# Patient Record
Sex: Female | Born: 1965 | Race: Black or African American | Hispanic: No | Smoking: Current every day smoker
Health system: Southern US, Community
[De-identification: ages and names within clinical notes are randomized; demographics above are authoritative.]

## PROBLEM LIST (undated history)

## (undated) DIAGNOSIS — N1330 Unspecified hydronephrosis: Secondary | ICD-10-CM

## (undated) DIAGNOSIS — I351 Nonrheumatic aortic (valve) insufficiency: Secondary | ICD-10-CM

## (undated) DIAGNOSIS — Z87442 Personal history of urinary calculi: Secondary | ICD-10-CM

## (undated) DIAGNOSIS — I428 Other cardiomyopathies: Secondary | ICD-10-CM

## (undated) DIAGNOSIS — I447 Left bundle-branch block, unspecified: Secondary | ICD-10-CM

## (undated) DIAGNOSIS — I5022 Chronic systolic (congestive) heart failure: Secondary | ICD-10-CM

## (undated) DIAGNOSIS — E785 Hyperlipidemia, unspecified: Secondary | ICD-10-CM

## (undated) DIAGNOSIS — I340 Nonrheumatic mitral (valve) insufficiency: Secondary | ICD-10-CM

## (undated) DIAGNOSIS — N183 Chronic kidney disease, stage 3 unspecified: Secondary | ICD-10-CM

## (undated) DIAGNOSIS — Z720 Tobacco use: Secondary | ICD-10-CM

## (undated) DIAGNOSIS — D649 Anemia, unspecified: Secondary | ICD-10-CM

## (undated) DIAGNOSIS — J449 Chronic obstructive pulmonary disease, unspecified: Secondary | ICD-10-CM

## (undated) DIAGNOSIS — D696 Thrombocytopenia, unspecified: Secondary | ICD-10-CM

## (undated) DIAGNOSIS — I219 Acute myocardial infarction, unspecified: Secondary | ICD-10-CM

## (undated) DIAGNOSIS — J154 Pneumonia due to other streptococci: Secondary | ICD-10-CM

## (undated) DIAGNOSIS — N200 Calculus of kidney: Secondary | ICD-10-CM

## (undated) DIAGNOSIS — D069 Carcinoma in situ of cervix, unspecified: Secondary | ICD-10-CM

## (undated) DIAGNOSIS — B20 Human immunodeficiency virus [HIV] disease: Secondary | ICD-10-CM

## (undated) DIAGNOSIS — I10 Essential (primary) hypertension: Secondary | ICD-10-CM

## (undated) DIAGNOSIS — J309 Allergic rhinitis, unspecified: Secondary | ICD-10-CM

## (undated) DIAGNOSIS — Z21 Asymptomatic human immunodeficiency virus [HIV] infection status: Secondary | ICD-10-CM

## (undated) HISTORY — PX: PACEMAKER IMPLANT: EP1218

## (undated) HISTORY — PX: CHOLECYSTECTOMY: SHX55

---

## 1999-04-16 ENCOUNTER — Emergency Department (HOSPITAL_COMMUNITY): Admission: EM | Admit: 1999-04-16 | Discharge: 1999-04-16 | Payer: Self-pay | Admitting: Emergency Medicine

## 2001-07-26 DIAGNOSIS — Z21 Asymptomatic human immunodeficiency virus [HIV] infection status: Secondary | ICD-10-CM

## 2001-07-26 HISTORY — DX: Asymptomatic human immunodeficiency virus (hiv) infection status: Z21

## 2003-12-26 ENCOUNTER — Inpatient Hospital Stay (HOSPITAL_COMMUNITY): Admission: EM | Admit: 2003-12-26 | Discharge: 2004-01-15 | Payer: Self-pay | Admitting: Emergency Medicine

## 2004-01-03 ENCOUNTER — Encounter: Payer: Self-pay | Admitting: Cardiology

## 2004-01-07 ENCOUNTER — Encounter (INDEPENDENT_AMBULATORY_CARE_PROVIDER_SITE_OTHER): Payer: Self-pay | Admitting: *Deleted

## 2004-01-10 ENCOUNTER — Encounter: Payer: Self-pay | Admitting: Thoracic Surgery

## 2004-01-29 ENCOUNTER — Encounter: Admission: RE | Admit: 2004-01-29 | Discharge: 2004-01-29 | Payer: Self-pay | Admitting: Thoracic Surgery

## 2004-02-04 ENCOUNTER — Encounter: Admission: RE | Admit: 2004-02-04 | Discharge: 2004-02-04 | Payer: Self-pay | Admitting: Internal Medicine

## 2004-02-12 ENCOUNTER — Emergency Department (HOSPITAL_COMMUNITY): Admission: EM | Admit: 2004-02-12 | Discharge: 2004-02-12 | Payer: Self-pay | Admitting: Emergency Medicine

## 2004-02-18 ENCOUNTER — Encounter: Admission: RE | Admit: 2004-02-18 | Discharge: 2004-02-18 | Payer: Self-pay | Admitting: Internal Medicine

## 2004-02-18 ENCOUNTER — Ambulatory Visit (HOSPITAL_COMMUNITY): Admission: RE | Admit: 2004-02-18 | Discharge: 2004-02-18 | Payer: Self-pay | Admitting: Internal Medicine

## 2004-03-18 ENCOUNTER — Ambulatory Visit (HOSPITAL_COMMUNITY): Admission: RE | Admit: 2004-03-18 | Discharge: 2004-03-18 | Payer: Self-pay | Admitting: Infectious Diseases

## 2004-03-18 ENCOUNTER — Encounter: Admission: RE | Admit: 2004-03-18 | Discharge: 2004-03-18 | Payer: Self-pay | Admitting: Infectious Diseases

## 2004-03-19 ENCOUNTER — Ambulatory Visit (HOSPITAL_COMMUNITY): Admission: RE | Admit: 2004-03-19 | Discharge: 2004-03-19 | Payer: Self-pay | Admitting: Internal Medicine

## 2004-08-12 ENCOUNTER — Encounter (INDEPENDENT_AMBULATORY_CARE_PROVIDER_SITE_OTHER): Payer: Self-pay | Admitting: *Deleted

## 2004-08-12 ENCOUNTER — Encounter: Payer: Self-pay | Admitting: Infectious Diseases

## 2004-08-12 ENCOUNTER — Ambulatory Visit (HOSPITAL_COMMUNITY): Admission: RE | Admit: 2004-08-12 | Discharge: 2004-08-12 | Payer: Self-pay | Admitting: Internal Medicine

## 2004-08-12 ENCOUNTER — Ambulatory Visit: Payer: Self-pay | Admitting: Internal Medicine

## 2004-08-12 ENCOUNTER — Encounter: Admission: RE | Admit: 2004-08-12 | Discharge: 2004-08-12 | Payer: Self-pay | Admitting: Thoracic Surgery

## 2004-08-12 ENCOUNTER — Other Ambulatory Visit: Admission: RE | Admit: 2004-08-12 | Discharge: 2004-08-12 | Payer: Self-pay | Admitting: Internal Medicine

## 2004-08-18 ENCOUNTER — Ambulatory Visit (HOSPITAL_COMMUNITY): Admission: RE | Admit: 2004-08-18 | Discharge: 2004-08-18 | Payer: Self-pay | Admitting: Internal Medicine

## 2004-10-18 ENCOUNTER — Emergency Department (HOSPITAL_COMMUNITY): Admission: EM | Admit: 2004-10-18 | Discharge: 2004-10-18 | Payer: Self-pay | Admitting: Emergency Medicine

## 2004-11-22 ENCOUNTER — Emergency Department (HOSPITAL_COMMUNITY): Admission: EM | Admit: 2004-11-22 | Discharge: 2004-11-22 | Payer: Self-pay | Admitting: Emergency Medicine

## 2004-12-22 ENCOUNTER — Ambulatory Visit: Payer: Self-pay | Admitting: Internal Medicine

## 2004-12-22 ENCOUNTER — Ambulatory Visit (HOSPITAL_COMMUNITY): Admission: RE | Admit: 2004-12-22 | Discharge: 2004-12-22 | Payer: Self-pay | Admitting: Internal Medicine

## 2005-02-10 ENCOUNTER — Ambulatory Visit: Payer: Self-pay | Admitting: Infectious Diseases

## 2005-02-24 ENCOUNTER — Ambulatory Visit: Payer: Self-pay | Admitting: Infectious Diseases

## 2005-05-28 ENCOUNTER — Ambulatory Visit (HOSPITAL_COMMUNITY): Admission: RE | Admit: 2005-05-28 | Discharge: 2005-05-28 | Payer: Self-pay | Admitting: Infectious Diseases

## 2005-05-28 ENCOUNTER — Ambulatory Visit: Payer: Self-pay | Admitting: Infectious Diseases

## 2005-05-30 ENCOUNTER — Emergency Department (HOSPITAL_COMMUNITY): Admission: EM | Admit: 2005-05-30 | Discharge: 2005-05-30 | Payer: Self-pay | Admitting: Emergency Medicine

## 2005-06-12 ENCOUNTER — Emergency Department (HOSPITAL_COMMUNITY): Admission: EM | Admit: 2005-06-12 | Discharge: 2005-06-12 | Payer: Self-pay | Admitting: Emergency Medicine

## 2005-06-25 ENCOUNTER — Ambulatory Visit: Payer: Self-pay | Admitting: Internal Medicine

## 2005-12-09 ENCOUNTER — Emergency Department (HOSPITAL_COMMUNITY): Admission: EM | Admit: 2005-12-09 | Discharge: 2005-12-10 | Payer: Self-pay | Admitting: Emergency Medicine

## 2005-12-11 IMAGING — CR DG CHEST 2V
2 series · 2 of 2 positions shown · non-contrast
Comparison: none

CLINICAL DATA: Pneumonia.
 TWO-VIEW CHEST, 01/12/04, [DATE] HOURS
 Comparison 01/11/04 [DATE] hours.

[view not recorded (1 of 2)]
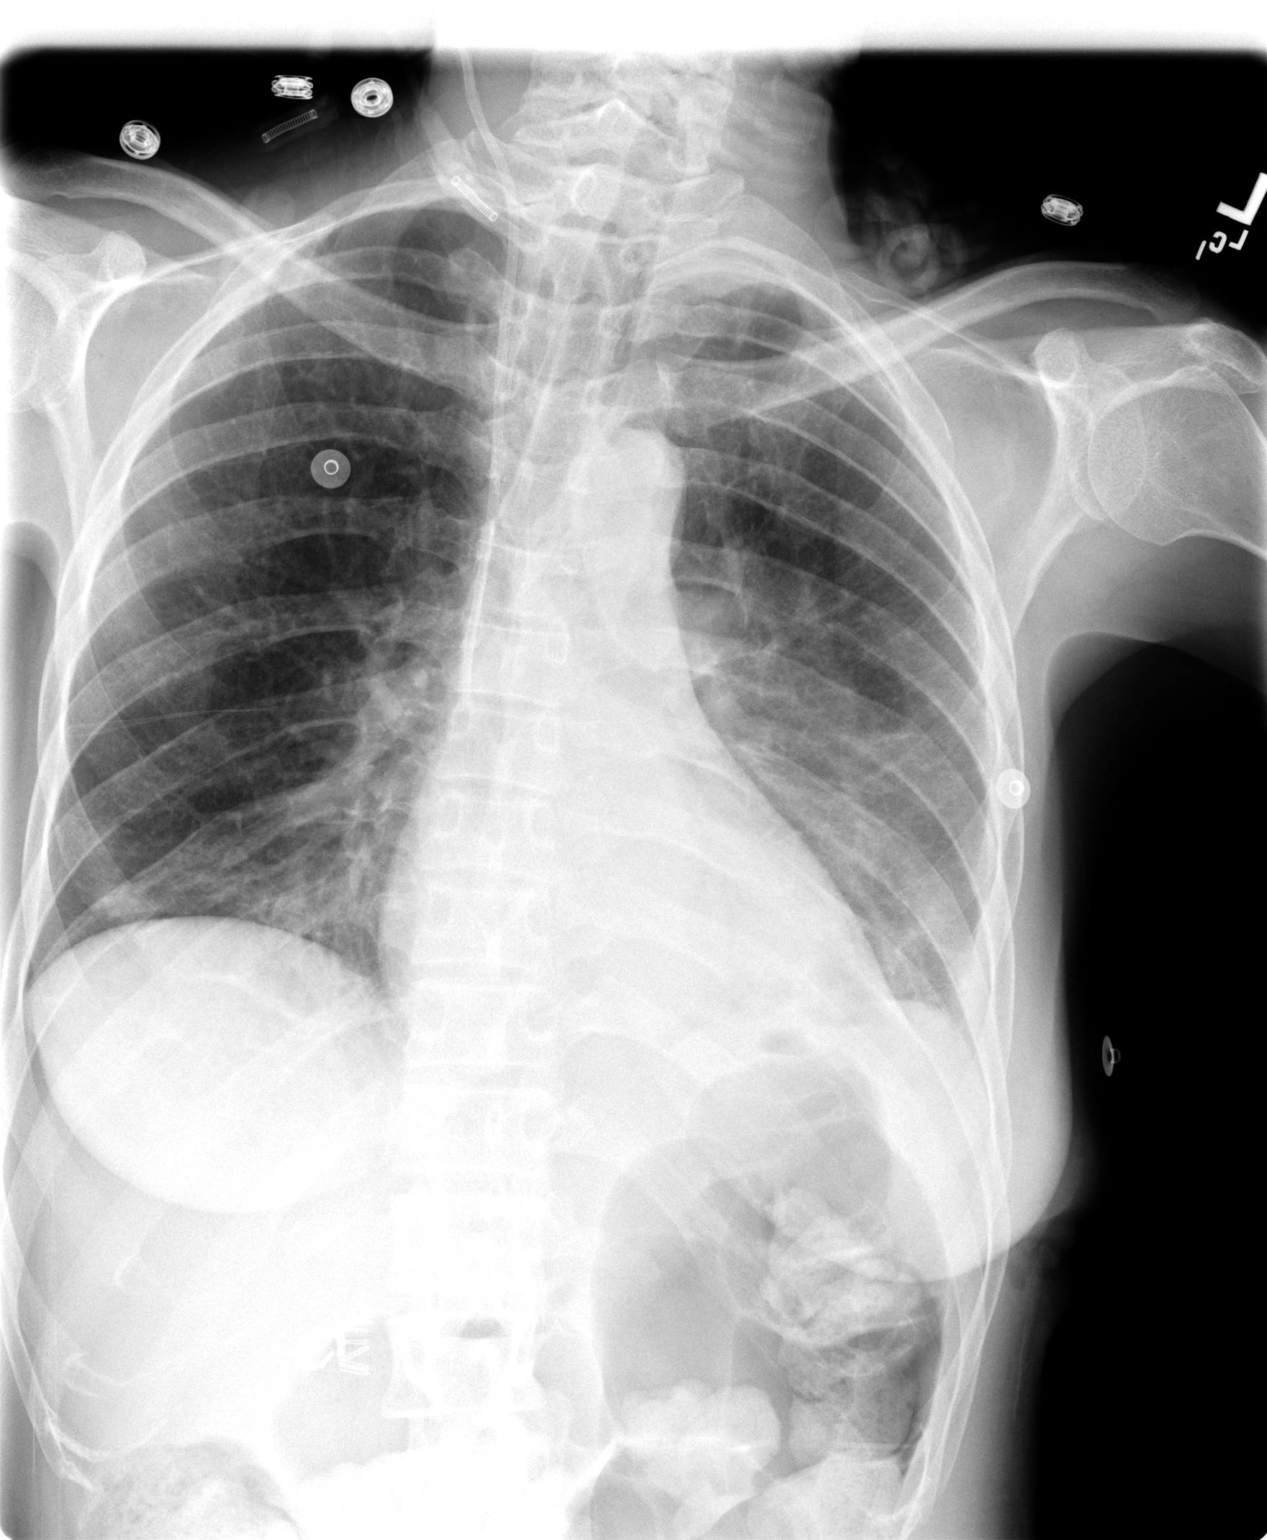

[view not recorded (2 of 2)]
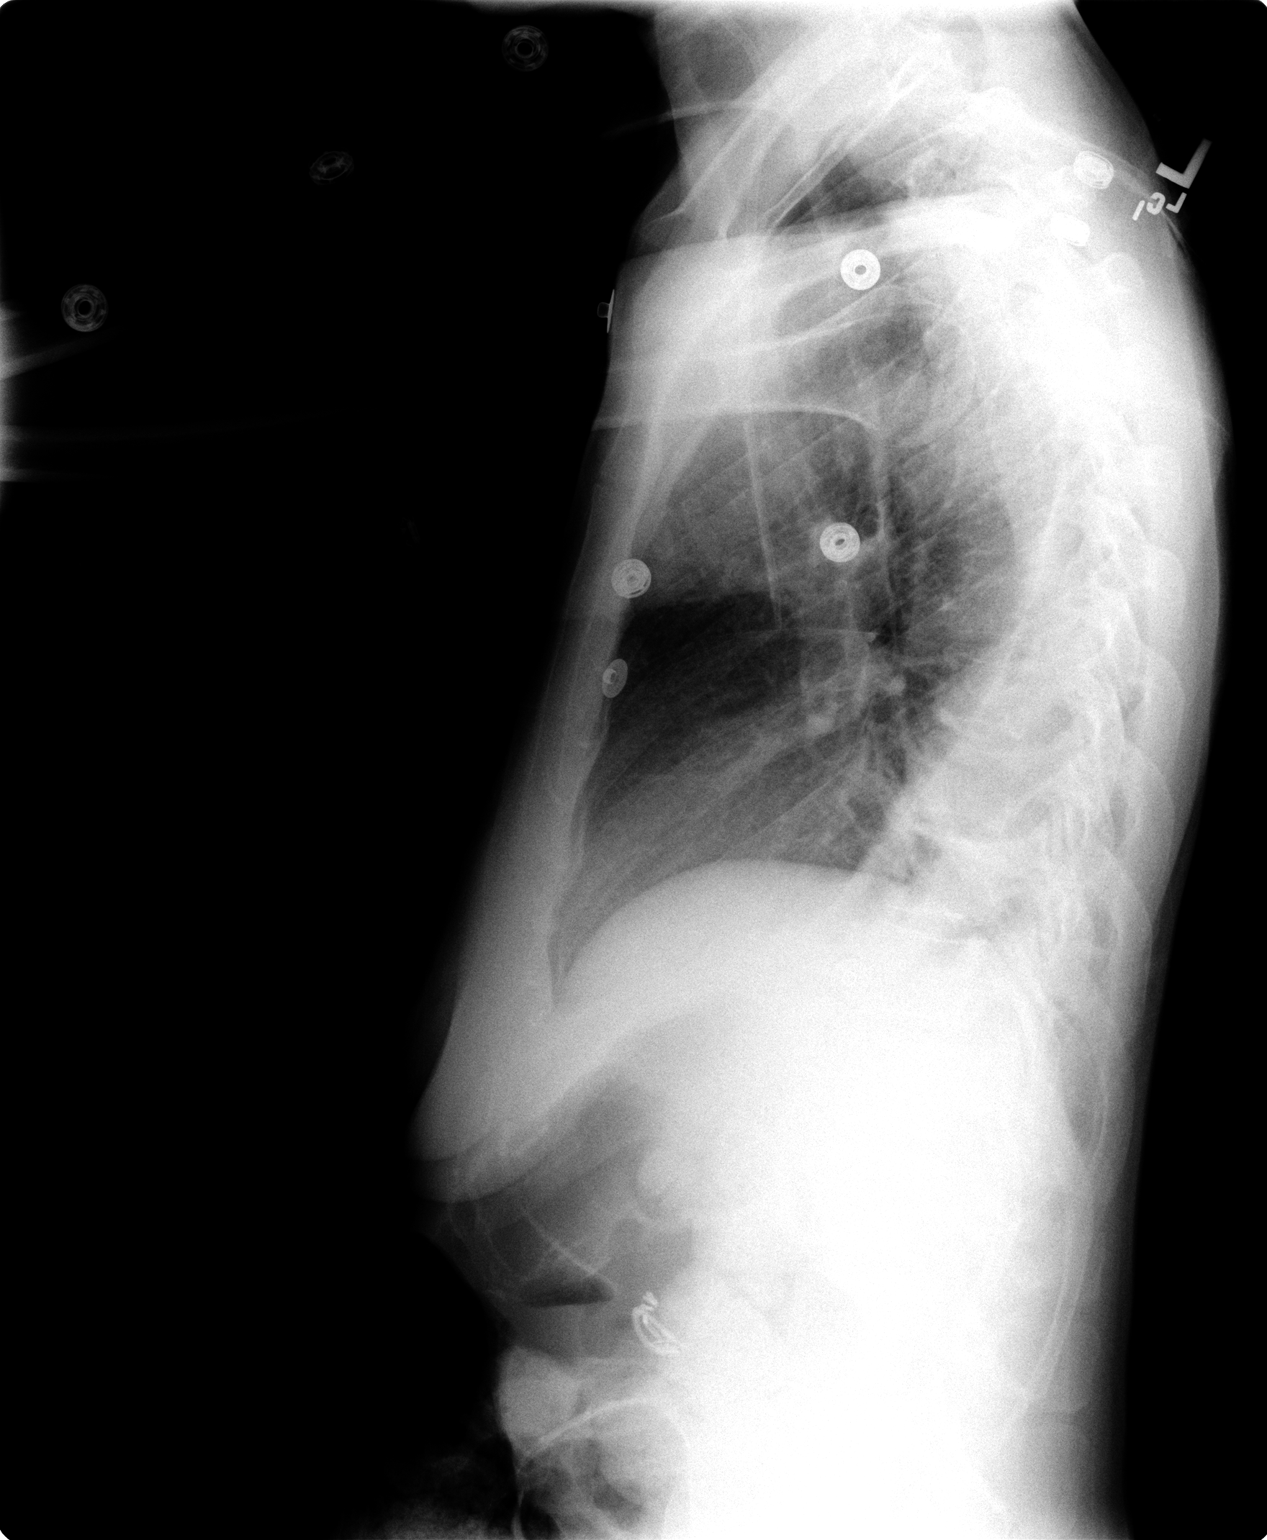

[2 of 2 positions shown; findings below may reference images not displayed]

FINDINGS: The right IJ vein central venous catheter is stable.  The heart is mildly enlarged.   A   left pleural effusion is not significantly changed.  Left lower lobe atelectasis is present and likely not significantly changed.  Right basilar atelectasis is mild.  No pneumothoraces are seen.  No interstitial edema is seen.
 IMPRESSION 
 Overall, there has been no significant change.  Small left effusion with left lower lobe atelectasis is present.  Mild right basilar atelectasis is present.

## 2005-12-15 ENCOUNTER — Inpatient Hospital Stay (HOSPITAL_COMMUNITY): Admission: AD | Admit: 2005-12-15 | Discharge: 2005-12-28 | Payer: Self-pay | Admitting: Internal Medicine

## 2005-12-15 ENCOUNTER — Ambulatory Visit: Payer: Self-pay | Admitting: Internal Medicine

## 2005-12-15 ENCOUNTER — Encounter: Payer: Self-pay | Admitting: Emergency Medicine

## 2005-12-16 ENCOUNTER — Encounter (INDEPENDENT_AMBULATORY_CARE_PROVIDER_SITE_OTHER): Payer: Self-pay | Admitting: *Deleted

## 2006-01-06 ENCOUNTER — Ambulatory Visit: Payer: Self-pay | Admitting: Internal Medicine

## 2006-02-23 ENCOUNTER — Ambulatory Visit: Payer: Self-pay | Admitting: Infectious Diseases

## 2006-02-23 ENCOUNTER — Encounter: Admission: RE | Admit: 2006-02-23 | Discharge: 2006-02-23 | Payer: Self-pay | Admitting: Infectious Diseases

## 2006-02-23 ENCOUNTER — Encounter (INDEPENDENT_AMBULATORY_CARE_PROVIDER_SITE_OTHER): Payer: Self-pay | Admitting: *Deleted

## 2006-02-23 LAB — CONVERTED CEMR LAB: CD4 Count: 9 microliters

## 2006-03-14 ENCOUNTER — Ambulatory Visit: Payer: Self-pay | Admitting: Infectious Diseases

## 2006-04-14 ENCOUNTER — Emergency Department (HOSPITAL_COMMUNITY): Admission: EM | Admit: 2006-04-14 | Discharge: 2006-04-14 | Payer: Self-pay | Admitting: Emergency Medicine

## 2006-05-20 ENCOUNTER — Ambulatory Visit: Payer: Self-pay | Admitting: Internal Medicine

## 2006-05-20 ENCOUNTER — Encounter: Payer: Self-pay | Admitting: Infectious Diseases

## 2006-05-20 ENCOUNTER — Encounter: Admission: RE | Admit: 2006-05-20 | Discharge: 2006-05-20 | Payer: Self-pay | Admitting: Internal Medicine

## 2006-05-20 ENCOUNTER — Encounter (INDEPENDENT_AMBULATORY_CARE_PROVIDER_SITE_OTHER): Payer: Self-pay | Admitting: *Deleted

## 2006-05-26 DIAGNOSIS — IMO0002 Reserved for concepts with insufficient information to code with codable children: Secondary | ICD-10-CM | POA: Insufficient documentation

## 2006-05-26 DIAGNOSIS — N1330 Unspecified hydronephrosis: Secondary | ICD-10-CM | POA: Insufficient documentation

## 2006-05-26 DIAGNOSIS — B59 Pneumocystosis: Secondary | ICD-10-CM | POA: Insufficient documentation

## 2006-05-26 DIAGNOSIS — Z9089 Acquired absence of other organs: Secondary | ICD-10-CM

## 2006-05-26 DIAGNOSIS — D709 Neutropenia, unspecified: Secondary | ICD-10-CM

## 2006-05-26 DIAGNOSIS — B20 Human immunodeficiency virus [HIV] disease: Secondary | ICD-10-CM | POA: Insufficient documentation

## 2006-05-26 DIAGNOSIS — D649 Anemia, unspecified: Secondary | ICD-10-CM

## 2006-05-26 DIAGNOSIS — Z21 Asymptomatic human immunodeficiency virus [HIV] infection status: Secondary | ICD-10-CM | POA: Insufficient documentation

## 2006-05-26 DIAGNOSIS — Z87410 Personal history of cervical dysplasia: Secondary | ICD-10-CM

## 2006-05-26 DIAGNOSIS — J154 Pneumonia due to other streptococci: Secondary | ICD-10-CM | POA: Insufficient documentation

## 2006-05-26 DIAGNOSIS — I10 Essential (primary) hypertension: Secondary | ICD-10-CM | POA: Insufficient documentation

## 2006-05-26 DIAGNOSIS — N135 Crossing vessel and stricture of ureter without hydronephrosis: Secondary | ICD-10-CM

## 2006-07-12 ENCOUNTER — Ambulatory Visit: Payer: Self-pay | Admitting: Internal Medicine

## 2006-07-12 ENCOUNTER — Ambulatory Visit (HOSPITAL_COMMUNITY): Admission: RE | Admit: 2006-07-12 | Discharge: 2006-07-12 | Payer: Self-pay | Admitting: Internal Medicine

## 2006-07-12 ENCOUNTER — Encounter: Payer: Self-pay | Admitting: Infectious Diseases

## 2006-08-15 ENCOUNTER — Emergency Department (HOSPITAL_COMMUNITY): Admission: EM | Admit: 2006-08-15 | Discharge: 2006-08-16 | Payer: Self-pay | Admitting: Emergency Medicine

## 2006-08-19 ENCOUNTER — Ambulatory Visit: Payer: Self-pay | Admitting: Internal Medicine

## 2006-08-19 ENCOUNTER — Encounter: Payer: Self-pay | Admitting: Infectious Diseases

## 2006-09-05 ENCOUNTER — Ambulatory Visit: Payer: Self-pay | Admitting: Infectious Diseases

## 2006-09-05 DIAGNOSIS — R259 Unspecified abnormal involuntary movements: Secondary | ICD-10-CM | POA: Insufficient documentation

## 2006-09-09 ENCOUNTER — Encounter (INDEPENDENT_AMBULATORY_CARE_PROVIDER_SITE_OTHER): Payer: Self-pay | Admitting: *Deleted

## 2006-09-09 ENCOUNTER — Ambulatory Visit: Payer: Self-pay | Admitting: Internal Medicine

## 2006-09-09 ENCOUNTER — Encounter: Admission: RE | Admit: 2006-09-09 | Discharge: 2006-09-09 | Payer: Self-pay | Admitting: Internal Medicine

## 2006-09-09 ENCOUNTER — Ambulatory Visit: Payer: Self-pay | Admitting: Infectious Diseases

## 2006-09-09 ENCOUNTER — Inpatient Hospital Stay (HOSPITAL_COMMUNITY): Admission: AD | Admit: 2006-09-09 | Discharge: 2006-09-14 | Payer: Self-pay | Admitting: Internal Medicine

## 2006-09-09 LAB — CONVERTED CEMR LAB: HIV 1 RNA Quant: 250000 copies/mL

## 2006-09-11 ENCOUNTER — Ambulatory Visit: Payer: Self-pay | Admitting: Infectious Diseases

## 2006-09-19 ENCOUNTER — Encounter (INDEPENDENT_AMBULATORY_CARE_PROVIDER_SITE_OTHER): Payer: Self-pay | Admitting: *Deleted

## 2006-11-21 IMAGING — CT CT HEAD WO/W CM
1 of 2 series · 13 of 30 positions shown, 17 images · IV contrast (omnipaque)
Comparison: none

CLINICAL DATA: Headache.
 HEAD CT WITH AND WITHOUT CONTRAST:
TECHNIQUE: Contiguous axial CT images were obtained from the skull base to the vertex before and after the administration of 100 cc of Omnipaque 300 contrast material.

[Series 2: brain · axial · 0.47mm/px · z∈[+142,+263]mm · 13 of 28 slices shown, 17 images]
[im 2/28  brain]
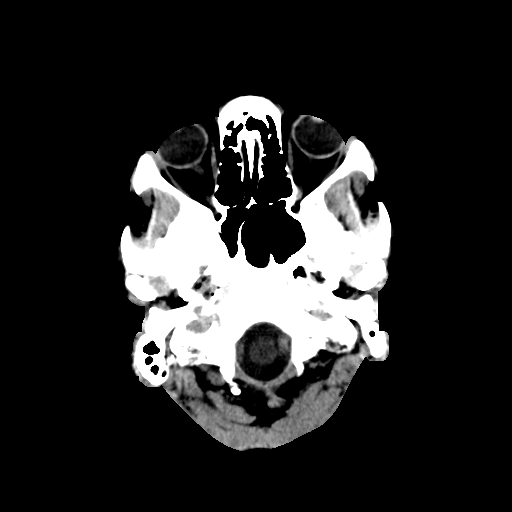
[im 2/28  bone]
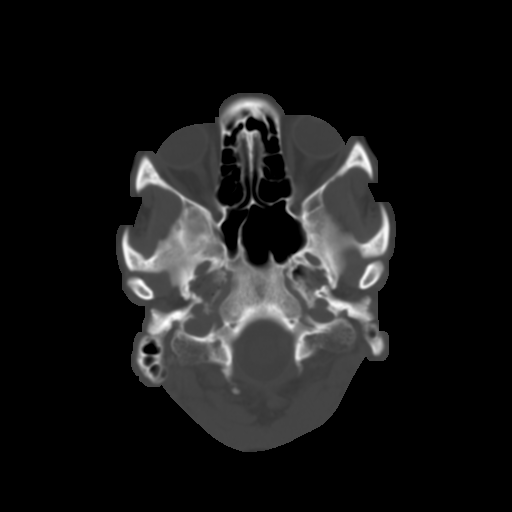
[im 4/28  brain]
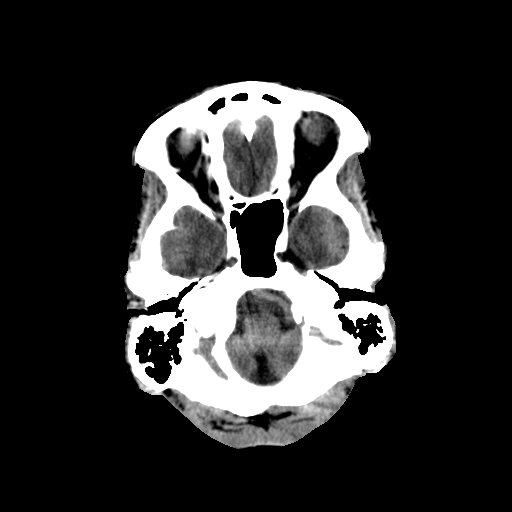
[im 6/28  brain]
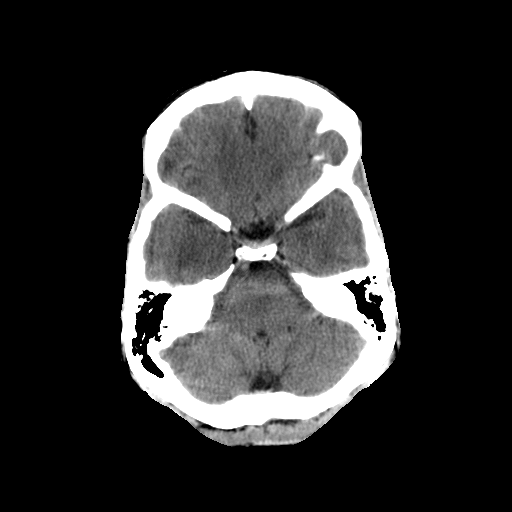
[im 8/28  brain]
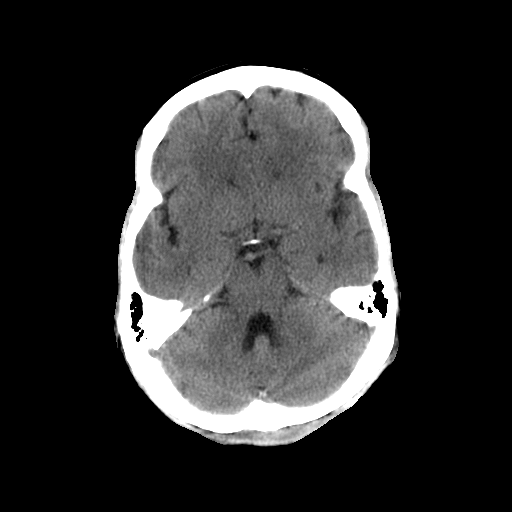
[im 10/28  brain]
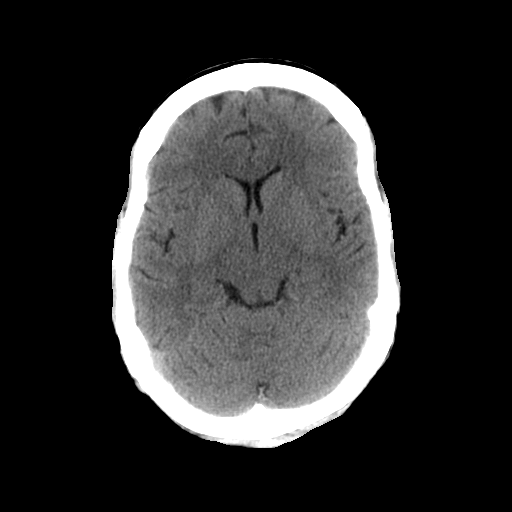
[im 10/28  bone]
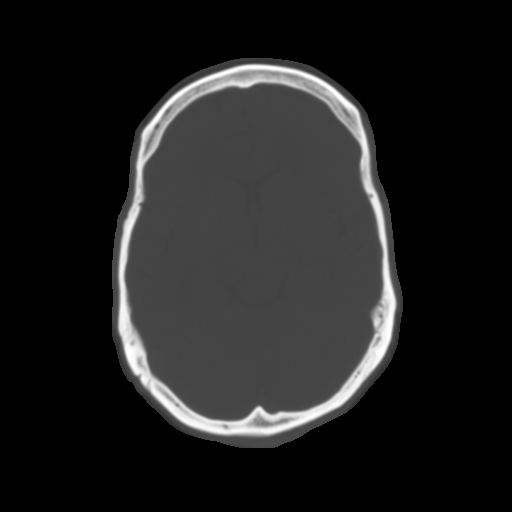
[im 12/28  brain]
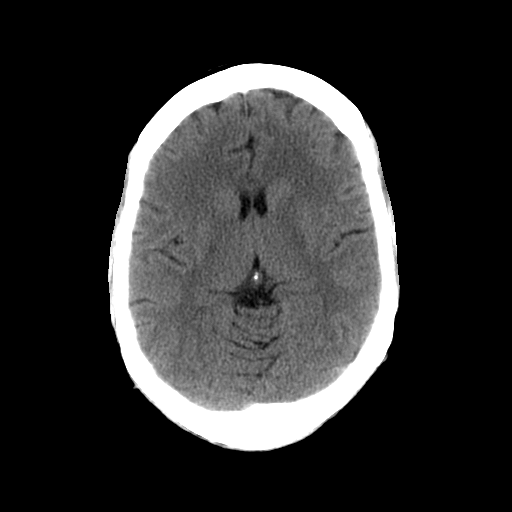
[im 14/28  brain]
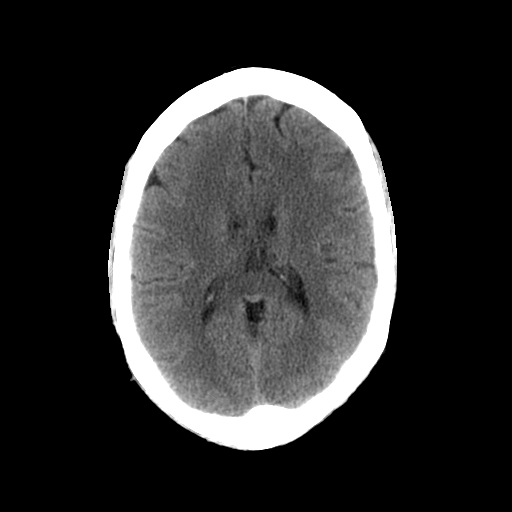
[im 16/28  brain]
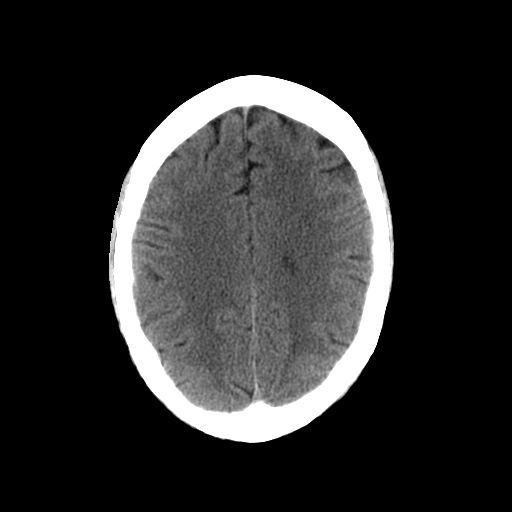
[im 18/28  brain]
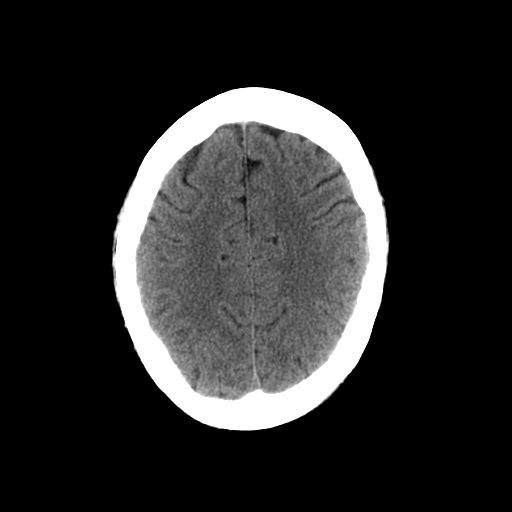
[im 18/28  bone]
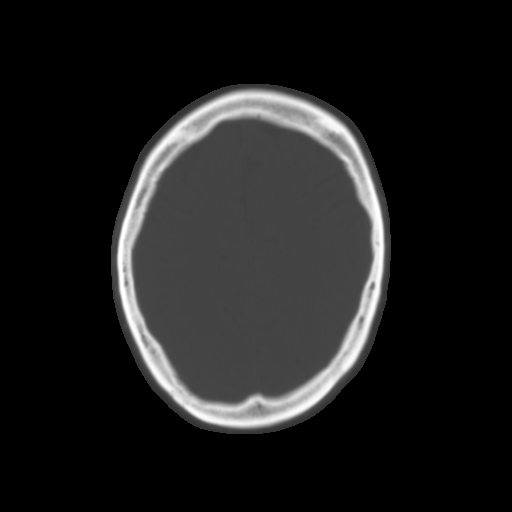
[im 20/28  brain]
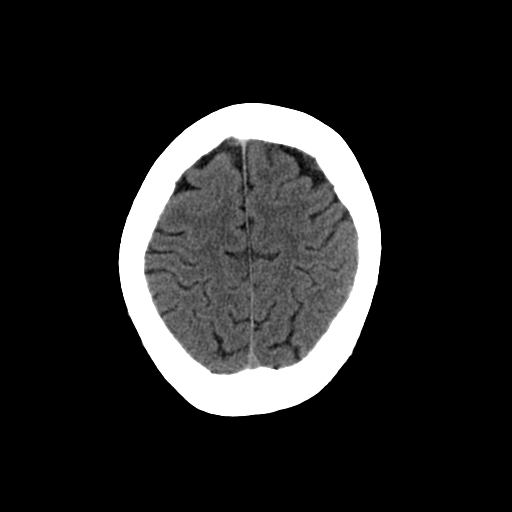
[im 22/28  brain]
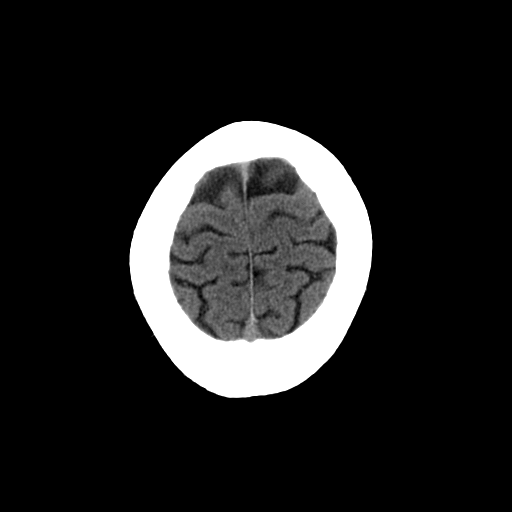
[im 24/28  brain]
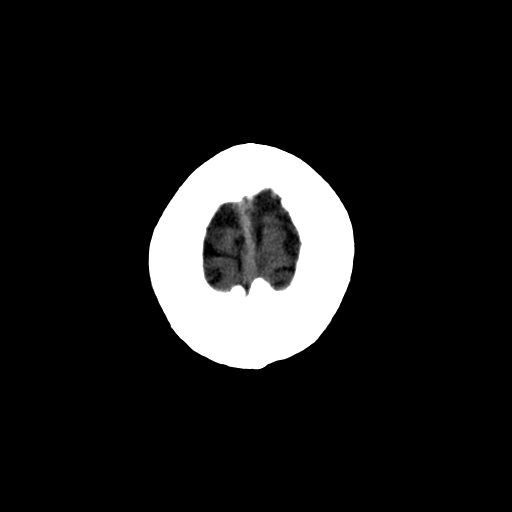
[im 26/28  brain]
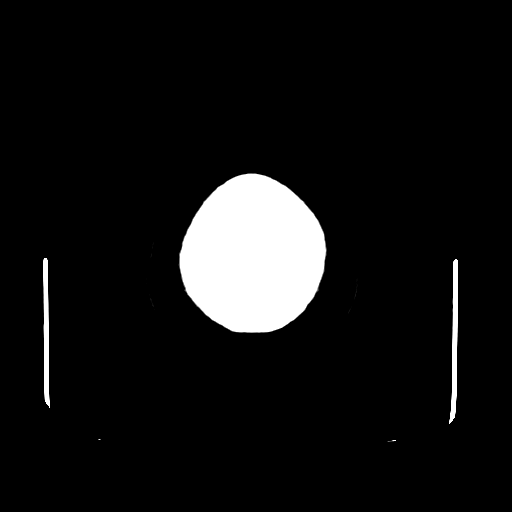
[im 26/28  bone]
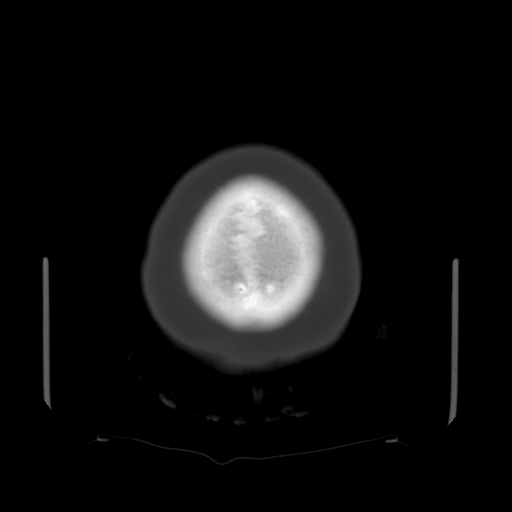

[13 of 30 positions shown; findings below may reference images not displayed]

FINDINGS: The brain appears normal without evidence of hemorrhage, infarct, mass, mass effect, midline shift, or abnormal extraaxial fluid collection.  No pathologic enhancement after contrast administration.  No hydrocephalus.
IMPRESSION: Negative exam.

## 2007-02-16 ENCOUNTER — Emergency Department (HOSPITAL_COMMUNITY): Admission: EM | Admit: 2007-02-16 | Discharge: 2007-02-16 | Payer: Self-pay | Admitting: Emergency Medicine

## 2007-02-27 ENCOUNTER — Encounter: Payer: Self-pay | Admitting: Infectious Diseases

## 2007-02-27 ENCOUNTER — Encounter: Admission: RE | Admit: 2007-02-27 | Discharge: 2007-02-27 | Payer: Self-pay | Admitting: Infectious Diseases

## 2007-02-27 ENCOUNTER — Ambulatory Visit: Payer: Self-pay | Admitting: Infectious Diseases

## 2007-02-27 DIAGNOSIS — M67919 Unspecified disorder of synovium and tendon, unspecified shoulder: Secondary | ICD-10-CM | POA: Insufficient documentation

## 2007-02-27 DIAGNOSIS — M719 Bursopathy, unspecified: Secondary | ICD-10-CM

## 2007-02-27 DIAGNOSIS — R87619 Unspecified abnormal cytological findings in specimens from cervix uteri: Secondary | ICD-10-CM | POA: Insufficient documentation

## 2007-02-27 LAB — CONVERTED CEMR LAB
AST: 13 units/L (ref 0–37)
Albumin: 4.1 g/dL (ref 3.5–5.2)
Alkaline Phosphatase: 107 units/L (ref 39–117)
BUN: 14 mg/dL (ref 6–23)
Basophils Relative: 1 % (ref 0–1)
CO2: 21 meq/L (ref 19–32)
Calcium: 9.4 mg/dL (ref 8.4–10.5)
Creatinine, Ser: 1.03 mg/dL (ref 0.40–1.20)
Eosinophils Absolute: 0.2 10*3/uL (ref 0.0–0.7)
HCT: 39.5 % (ref 36.0–46.0)
Hemoglobin: 13.3 g/dL (ref 12.0–15.0)
MCHC: 33.7 g/dL (ref 30.0–36.0)
MCV: 87.4 fL (ref 78.0–100.0)
Neutrophils Relative %: 42 % — ABNORMAL LOW (ref 43–77)
Potassium: 4.3 meq/L (ref 3.5–5.3)
RBC: 4.52 M/uL (ref 3.87–5.11)
RDW: 15.3 % — ABNORMAL HIGH (ref 11.5–14.0)
Sodium: 143 meq/L (ref 135–145)
Total Bilirubin: 1.3 mg/dL — ABNORMAL HIGH (ref 0.3–1.2)
Triglycerides: 58 mg/dL (ref <150)

## 2007-04-11 ENCOUNTER — Telehealth: Payer: Self-pay | Admitting: Infectious Diseases

## 2007-08-18 ENCOUNTER — Encounter: Payer: Self-pay | Admitting: Infectious Diseases

## 2007-12-22 ENCOUNTER — Encounter: Admission: RE | Admit: 2007-12-22 | Discharge: 2007-12-22 | Payer: Self-pay | Admitting: Infectious Diseases

## 2007-12-22 ENCOUNTER — Ambulatory Visit: Payer: Self-pay | Admitting: Infectious Diseases

## 2007-12-22 LAB — CONVERTED CEMR LAB
AST: 14 units/L (ref 0–37)
Albumin: 3.9 g/dL (ref 3.5–5.2)
BUN: 13 mg/dL (ref 6–23)
Calcium: 9.1 mg/dL (ref 8.4–10.5)
Chloride: 107 meq/L (ref 96–112)
Cholesterol: 188 mg/dL (ref 0–200)
Creatinine, Ser: 0.84 mg/dL (ref 0.40–1.20)
Eosinophils Relative: 2 % (ref 0–5)
Lymphocytes Relative: 49 % — ABNORMAL HIGH (ref 12–46)
Lymphs Abs: 2.2 10*3/uL (ref 0.7–4.0)
MCHC: 32.8 g/dL (ref 30.0–36.0)
MCV: 88.5 fL (ref 78.0–100.0)
Monocytes Absolute: 0.5 10*3/uL (ref 0.1–1.0)
Monocytes Relative: 11 % (ref 3–12)
Neutrophils Relative %: 37 % — ABNORMAL LOW (ref 43–77)
Platelets: 149 10*3/uL — ABNORMAL LOW (ref 150–400)
Potassium: 4 meq/L (ref 3.5–5.3)
RBC: 4.61 M/uL (ref 3.87–5.11)
RDW: 16.3 % — ABNORMAL HIGH (ref 11.5–15.5)
Total Protein: 6.7 g/dL (ref 6.0–8.3)
Triglycerides: 98 mg/dL (ref <150)
VLDL: 20 mg/dL (ref 0–40)

## 2008-01-03 ENCOUNTER — Ambulatory Visit: Payer: Self-pay | Admitting: Infectious Diseases

## 2008-01-03 ENCOUNTER — Telehealth (INDEPENDENT_AMBULATORY_CARE_PROVIDER_SITE_OTHER): Payer: Self-pay | Admitting: *Deleted

## 2008-01-31 ENCOUNTER — Encounter (INDEPENDENT_AMBULATORY_CARE_PROVIDER_SITE_OTHER): Payer: Self-pay | Admitting: *Deleted

## 2008-02-07 ENCOUNTER — Ambulatory Visit: Payer: Self-pay | Admitting: Infectious Diseases

## 2008-02-07 LAB — CONVERTED CEMR LAB
CO2: 22 meq/L (ref 19–32)
Calcium: 9.1 mg/dL (ref 8.4–10.5)
Chloride: 108 meq/L (ref 96–112)
Creatinine, Ser: 0.93 mg/dL (ref 0.40–1.20)

## 2008-02-09 ENCOUNTER — Telehealth: Payer: Self-pay

## 2008-04-10 ENCOUNTER — Ambulatory Visit: Payer: Self-pay | Admitting: Infectious Diseases

## 2008-04-10 LAB — CONVERTED CEMR LAB
ALT: 9 units/L (ref 0–35)
Basophils Absolute: 0 10*3/uL (ref 0.0–0.1)
CO2: 22 meq/L (ref 19–32)
Creatinine, Ser: 0.85 mg/dL (ref 0.40–1.20)
Glucose, Bld: 67 mg/dL — ABNORMAL LOW (ref 70–99)
HCT: 41.6 % (ref 36.0–46.0)
HIV-1 RNA Quant, Log: 2.08 — ABNORMAL HIGH (ref <1.70)
Lymphocytes Relative: 39 % (ref 12–46)
Lymphs Abs: 2.3 10*3/uL (ref 0.7–4.0)
MCHC: 32.9 g/dL (ref 30.0–36.0)
Monocytes Absolute: 0.6 10*3/uL (ref 0.1–1.0)
Potassium: 4.6 meq/L (ref 3.5–5.3)
RBC: 4.47 M/uL (ref 3.87–5.11)
RDW: 15 % (ref 11.5–15.5)
Sodium: 144 meq/L (ref 135–145)
Total Bilirubin: 0.9 mg/dL (ref 0.3–1.2)
Total Protein: 7.3 g/dL (ref 6.0–8.3)

## 2008-08-14 ENCOUNTER — Ambulatory Visit: Payer: Self-pay | Admitting: Infectious Diseases

## 2008-08-14 LAB — CONVERTED CEMR LAB
Basophils Absolute: 0 10*3/uL (ref 0.0–0.1)
CO2: 24 meq/L (ref 19–32)
Chloride: 109 meq/L (ref 96–112)
Creatinine, Ser: 0.81 mg/dL (ref 0.40–1.20)
HCT: 36.8 % (ref 36.0–46.0)
HIV 1 RNA Quant: 55 copies/mL — ABNORMAL HIGH (ref <48)
Lymphs Abs: 1.7 10*3/uL (ref 0.7–4.0)
MCHC: 32.6 g/dL (ref 30.0–36.0)
MCV: 90.9 fL (ref 78.0–100.0)
Neutro Abs: 2.5 10*3/uL (ref 1.7–7.7)
Potassium: 3.9 meq/L (ref 3.5–5.3)
Sodium: 143 meq/L (ref 135–145)
Total Bilirubin: 2.1 mg/dL — ABNORMAL HIGH (ref 0.3–1.2)
Total CHOL/HDL Ratio: 3.2
Triglycerides: 97 mg/dL (ref <150)
VLDL: 19 mg/dL (ref 0–40)
WBC: 5.1 10*3/uL (ref 4.0–10.5)

## 2008-08-15 ENCOUNTER — Encounter: Payer: Self-pay | Admitting: Infectious Diseases

## 2008-10-18 ENCOUNTER — Encounter: Payer: Self-pay | Admitting: Infectious Diseases

## 2008-10-18 ENCOUNTER — Telehealth (INDEPENDENT_AMBULATORY_CARE_PROVIDER_SITE_OTHER): Payer: Self-pay | Admitting: Licensed Clinical Social Worker

## 2009-04-04 ENCOUNTER — Encounter: Payer: Self-pay | Admitting: Infectious Diseases

## 2009-04-04 ENCOUNTER — Encounter (INDEPENDENT_AMBULATORY_CARE_PROVIDER_SITE_OTHER): Payer: Self-pay | Admitting: *Deleted

## 2009-05-01 ENCOUNTER — Telehealth: Payer: Self-pay

## 2009-05-07 ENCOUNTER — Telehealth: Payer: Self-pay

## 2009-08-19 ENCOUNTER — Emergency Department (HOSPITAL_COMMUNITY): Admission: EM | Admit: 2009-08-19 | Discharge: 2009-08-19 | Payer: Self-pay | Admitting: Emergency Medicine

## 2009-09-04 ENCOUNTER — Encounter: Payer: Self-pay | Admitting: Infectious Diseases

## 2009-09-08 ENCOUNTER — Encounter: Payer: Self-pay | Admitting: Infectious Diseases

## 2009-09-23 ENCOUNTER — Telehealth (INDEPENDENT_AMBULATORY_CARE_PROVIDER_SITE_OTHER): Payer: Self-pay | Admitting: *Deleted

## 2009-10-07 ENCOUNTER — Ambulatory Visit: Payer: Self-pay | Admitting: Infectious Diseases

## 2009-10-07 LAB — CONVERTED CEMR LAB
Albumin: 4.3 g/dL (ref 3.5–5.2)
Alkaline Phosphatase: 141 units/L — ABNORMAL HIGH (ref 39–117)
Basophils Absolute: 0 10*3/uL (ref 0.0–0.1)
Bilirubin Urine: NEGATIVE
Chloride: 108 meq/L (ref 96–112)
Creatinine, Ser: 0.96 mg/dL (ref 0.40–1.20)
Crystals: NONE SEEN
HDL: 54 mg/dL (ref >39)
Hemoglobin: 13.9 g/dL (ref 12.0–15.0)
Ketones, ur: NEGATIVE mg/dL
Lymphocytes Relative: 41 % (ref 12–46)
MCHC: 32.1 g/dL (ref 30.0–36.0)
MCV: 91.9 fL (ref >=78.0)
Monocytes Absolute: 0.5 10*3/uL (ref 0.1–1.0)
Neutro Abs: 3.3 10*3/uL (ref 1.7–7.7)
Potassium: 3.9 meq/L (ref 3.5–5.3)
Protein, ur: 30 mg/dL — AB
RBC: 4.71 M/uL (ref 3.87–5.11)
RDW: 16.2 % — ABNORMAL HIGH (ref 11.5–15.5)
Total Protein: 7.5 g/dL (ref 6.0–8.3)
Urine Glucose: NEGATIVE mg/dL
WBC: 6.7 10*3/uL (ref 4.0–10.5)

## 2009-10-29 ENCOUNTER — Ambulatory Visit: Payer: Self-pay | Admitting: Infectious Diseases

## 2009-10-29 DIAGNOSIS — F17200 Nicotine dependence, unspecified, uncomplicated: Secondary | ICD-10-CM | POA: Insufficient documentation

## 2009-12-05 ENCOUNTER — Encounter (INDEPENDENT_AMBULATORY_CARE_PROVIDER_SITE_OTHER): Payer: Self-pay | Admitting: *Deleted

## 2010-06-09 ENCOUNTER — Encounter (INDEPENDENT_AMBULATORY_CARE_PROVIDER_SITE_OTHER): Payer: Self-pay | Admitting: *Deleted

## 2010-08-15 ENCOUNTER — Encounter: Payer: Self-pay | Admitting: Thoracic Surgery

## 2010-08-15 ENCOUNTER — Encounter: Payer: Self-pay | Admitting: Internal Medicine

## 2010-08-16 ENCOUNTER — Encounter: Payer: Self-pay | Admitting: Thoracic Surgery

## 2010-08-17 ENCOUNTER — Encounter (INDEPENDENT_AMBULATORY_CARE_PROVIDER_SITE_OTHER): Payer: Self-pay | Admitting: *Deleted

## 2010-08-25 NOTE — Miscellaneous (Signed)
  Clinical Lists Changes  Observations: Added new observation of YEARAIDSPOS: 2006  (06/09/2010 16:19)

## 2010-08-25 NOTE — Assessment & Plan Note (Signed)
Summary: F/U APPT/CFB   CC:  follow-up visit.  History of Present Illness: 45 yo F with HIV/AIDS dx 2002, previous PCP. As well she has a history of tremor, CIN-I.   CD4 510 (prev 240).  VL <48 (3--21-11) Genotype 8-08 K103N.  has been doing wel, trying to stay out of trouble. feels well. down to 1/2 ppd cigarettes. ran out o fBP meds.    Preventive Screening-Counseling & Management  Alcohol-Tobacco     Alcohol drinks/day: 0     Smoking Status: current     Smoking Cessation Counseling: yes     Packs/Day: 0.5  Caffeine-Diet-Exercise     Caffeine use/day: tea     Does Patient Exercise: no     Type of exercise: active with grand children  Safety-Violence-Falls     Seat Belt Use: yes   Updated Prior Medication List: REYATAZ 300 MG CAPS (ATAZANAVIR SULFATE) Take 1 tablet by mouth once a day NORVIR 100 MG CAPS (RITONAVIR) Take 1 tablet by mouth once a day TRUVADA 200-300 MG TABS (EMTRICITABINE-TENOFOVIR) Take 1 tablet by mouth once a day LISINOPRIL 20 MG TABS (LISINOPRIL) Take 1 tablet by mouth once a day LASIX 20 MG TABS (FUROSEMIDE) Take 1 tablet by mouth once a day  Current Allergies (reviewed today): ! BACTRIM ! SULFA Social History: Current Smoker- 1 ppd x >10 years.  Alcohol use-no Drug use-no  Review of Systems       eating well, passing BM well, nl urine, nl periods. has headache , no chest pain. wt down 12#.   Vital Signs:  Patient profile:   45 year old female Height:      67.5 inches (171.45 cm) Weight:      122.8 pounds (55.82 kg) BMI:     19.02 Temp:     98.2 degrees F (36.78 degrees C) oral Pulse rate:   85 / minute BP sitting:   177 / 121  (left arm)  Vitals Entered By: Baxter Hire) (October 29, 2009 9:43 AM) CC: follow-up visit Is Patient Diabetic? No Pain Assessment Patient in pain? no      Nutritional Status BMI of 19 -24 = normal Nutritional Status Detail appetite is pretty good per patient  Have you ever been in a relationship  where you felt threatened, hurt or afraid?No   Does patient need assistance? Functional Status Self care Ambulation Normal   Physical Exam  General:  well-developed, well-nourished, well-hydrated, and underweight appearing.   Eyes:  pupils equal, pupils round, and pupils reactive to light.   Mouth:  pharynx pink and moist and no exudates.   Neck:  no masses.   Lungs:  normal respiratory effort and normal breath sounds.   Heart:  normal rate, regular rhythm, and no murmur.   Abdomen:  soft, non-tender, and normal bowel sounds.          Medication Adherence: 10/29/2009   Adherence to medications reviewed with patient. Counseling to provide adequate adherence provided   Prevention For Positives: 10/29/2009   Safe sex practices discussed with patient. Condoms offered.                             Impression & Recommendations:  Problem # 1:  HIV DISEASE (ICD-042) needs PAP, offered condoms. doing very well on ART. meds refilled. return to clinic 4 months  Problem # 2:  PAP SMEAR, ABNORMAL (ICD-795.00) will refer for PAP.   Problem # 3:  HYPERTENSION (ICD-401.9) meds refilled  Her updated medication list for this problem includes:    Lisinopril 20 Mg Tabs (Lisinopril) .Marland Kitchen... Take 1 tablet by mouth once a day    Lasix 20 Mg Tabs (Furosemide) .Marland Kitchen... Take 1 tablet by mouth once a day  Problem # 4:  TOBACCO USER (ICD-305.1) has cut down and is committed to quiting.   Other Orders: Est. Patient Level IV (09811) Future Orders: T-CD4SP ([facility]) (CD4SP) ... 01/27/2010 T-HIV Viral Load [phone]) ... 01/27/2010 T-Comprehensive Metabolic Panel [phone]) ... 01/27/2010 T-CBC w/Diff ([id number]) ... 01/27/2010  Current Medications (verified): 1)  Reyataz 300 Mg Caps (Atazanavir Sulfate) .... Take 1 Tablet By Mouth Once A Day 2)  Norvir 100 Mg Caps (Ritonavir) .... Take 1 Tablet By Mouth Once A Day 3)  Truvada 200-300 Mg Tabs (Emtricitabine-Tenofovir) .... Take 1  Tablet By Mouth Once A Day 4)  Lisinopril 20 Mg Tabs (Lisinopril) .... Take 1 Tablet By Mouth Once A Day 5)  Lasix 20 Mg Tabs (Furosemide) .... Take 1 Tablet By Mouth Once A Day  Allergies (verified): 1)  ! Bactrim 2)  ! Sulfa   Prescriptions: LASIX 20 MG TABS (FUROSEMIDE) Take 1 tablet by mouth once a day  #30 x 3   Entered and Authorized by:   Johny Sax MD   Signed by:   Johny Sax MD on 10/29/2009   Method used:   Electronically to        [facility]* (retail)       [address]       [place], [place]  [id number]       Ph: [phone]       Fax: [phone]   RxID:   [id number] LISINOPRIL 20 MG TABS (LISINOPRIL) Take 1 tablet by mouth once a day  #90 x 3   Entered and Authorized by:   Johny Sax MD   Signed by:   Johny Sax MD on 10/29/2009   Method used:   Electronically to        [facility]* (retail)       [address]       [place], [place]  [address]       Ph: [phone]       Fax: [phone]   RxID:   [id number] TRUVADA 200-300 MG TABS (EMTRICITABINE-TENOFOVIR) Take 1 tablet by mouth once a day  #30 x 6   Entered and Authorized by:   Johny Sax MD   Signed by:   Johny Sax MD on 10/29/2009   Method used:   Electronically to        [facility]* (retail)       [address]       [place], [place]  [id number]       Ph: [phone]       Fax: [phone]   RxID:   [id number] NORVIR 100 MG CAPS (RITONAVIR) Take 1 tablet by mouth once a day  #30 x 6   Entered and Authorized by:   Johny Sax MD   Signed by:   Johny Sax MD on 10/29/2009   Method used:   Electronically to        [facility]* (retail)       [address]       [place], [place]  [address]       Ph: [phone]       Fax: [phone]   RxID:   [id number] REYATAZ 300 MG CAPS (ATAZANAVIR SULFATE) Take 1 tablet by mouth  once a day  #30 x 6   Entered and Authorized by:   Johny Sax MD   Signed  by:   Johny Sax MD on 10/29/2009   Method used:   Electronically to        [facility]* (retail)       [address]       [place], [place]  [address]       Ph: [phone]       Fax: [phone]   RxID:   [id number]  Process Orders Check Orders Results:     [facility]: Check successful Tests Sent for requisitioning (October 29, 2009 10:30 AM):     01/27/2010: [facility] -- T-HIV Viral Load [phone] (signed)     01/27/2010: [facility] -- T-Comprehensive Metabolic Panel [[id number]] (signed)     01/27/2010: [facility] -- [facility] w/Diff [[id number]] (signed)    Appended Document: Immunization Entry     Immunization History:  Influenza Immunization History:    Influenza:  historical (05/26/2009)

## 2010-08-25 NOTE — Progress Notes (Signed)
  Phone Note Outgoing Call   Call placed by: Sharol Roussel Summary of Call: patient to be enrolled into bridge counseling on 09/29/09

## 2010-08-25 NOTE — Miscellaneous (Signed)
Summary: Bridge Counselor  Clinical Lists Changes Pt enrolled into [facility] program on 09/29/09. Pt had moved to [place], [place] and did not know where to access care there and did not have the means to get to [facility]. [facility] transported pt to [facility] on 10/07/09 for labs and then on 10/29/09 for her appointment with Dr. Ninetta Lights. BC found a clinic in [place] and a Juanell Fairly funded case management agency that is closer to pt's home. [facility] made this referral for pt on 11/10/09. [facility] will update pt's status as soon as pt is enrolled in care in [place], [place].  Selena Batten [facility]  Dec 05, 2009 2:38 PM

## 2010-08-25 NOTE — Medication Information (Signed)
Summary: [place] [facility]: Rx  [place] [facility]: Rx   Imported By: Florinda Marker 09/05/2009 14:53:33  _____________________________________________________________________  External Attachment:    Type:   Image     Comment:   External Document

## 2010-08-25 NOTE — Miscellaneous (Signed)
Summary: Lab orders   Clinical Lists Changes  Problems: Added new problem of ENCOUNTER FOR LONG-TERM USE OF OTHER MEDICATIONS (ICD-V58.69) Orders: Added new Test order of T-CD4SP [facility]) (CD4SP) - Signed Added new Test order of T-HIV Viral Load [phone]) - Signed Added new Test order of T-CBC w/Diff [phone]) - Signed Added new Test order of T-Comprehensive Metabolic Panel [phone]) - Signed Added new Test order of T-Lipid Profile ([id number]) - Signed Added new Test order of T-RPR (Syphilis) [phone]) - Signed Added new Test order of T-Urinalysis ([id number]) - Signed Added new Test order of T-Chlamydia  Probe, urine [phone]) - Signed Added new Test order of T-GC Probe, urine [phone]) - Signed     Process Orders Check Orders Results:     [facility]: Check successful Tests Sent for requisitioning (October 10, 2009 10:25 AM):     10/07/2009: [facility] -- T-HIV Viral Load [phone] (signed)     10/07/2009: [facility] -- T-CBC w/Diff [[id number]] (signed)     10/07/2009: [facility] -- T-Comprehensive Metabolic Panel [[id number]] (signed)     10/07/2009: [facility] -- T-Lipid Profile [phone] (signed)     10/07/2009: [facility] -- T-RPR (Syphilis) [phone] (signed)     10/07/2009: [facility] -- T-Urinalysis [[id number]] (signed)     10/07/2009: [facility] -- T-Chlamydia  Probe, urine [phone] (signed)     10/07/2009: [facility] -- T-GC Probe, urine [phone] (signed)

## 2010-08-27 NOTE — Miscellaneous (Signed)
Summary: RW Update  Clinical Lists Changes  Observations: Added new observation of HOUSEINCOME: 0  (08/17/2010 11:54) Added new observation of PCTFPL: 0.00  (08/17/2010 11:54) Added new observation of INCOMESOURCE: unknown  (08/17/2010 11:54)

## 2010-09-16 ENCOUNTER — Encounter: Payer: Self-pay | Admitting: Infectious Diseases

## 2010-09-22 NOTE — Miscellaneous (Signed)
  Clinical Lists Changes  Observations: Added new observation of FLU VAX: Historical (05/13/2010 15:35)      Immunization History:  Influenza Immunization History:    Influenza:  historical (05/13/2010)

## 2010-10-11 LAB — URINE CULTURE: Colony Count: >=100000

## 2010-10-11 LAB — URINALYSIS, ROUTINE W REFLEX MICROSCOPIC
Specific Gravity, Urine: 1.015 (ref 1.005–1.030)
Urobilinogen, UA: 4 mg/dL — ABNORMAL HIGH (ref 0.0–1.0)

## 2010-10-11 LAB — URINE MICROSCOPIC-ADD ON

## 2010-11-09 LAB — T-HELPER CELL (CD4) - (RCID CLINIC ONLY): CD4 T Cell Abs: 170 uL — ABNORMAL LOW (ref 400–2700)

## 2010-12-11 NOTE — Procedures (Signed)
EEG NUMBER:  [id number].   CLINICAL HISTORY:  The patient is a 45 year old African-American with  HIV AIDS.  The patient was started on Megace in December of 2007 and  stopped taking it due to right-sided tremor.  The patient was admitted  with increasing weakness.  The study is being done to look for the  presence of seizures.   PROCEDURE:  The tracing was carried out of 32 channel digital Cadwell  recorder reformatted into 16 channel montages with one devoted to EKG.  The International 10/20 system lead placement was used.   MEDICATIONS:  1. Lovenox.  2. Lyrica.  3. Aspirin.  4. Mysoline.  5. Cardizem.  6. Lasix.  7. Reyataz.  8. Tylenol.  9. Xanax.  10.Benadryl.  11.Nitroglycerin.   DESCRIPTION OF FINDINGS:  Dominant frequency is a 10 Hz 15-20 microvolt  alpha range activity with superimposed under 10 microvolt beta range  components.  The patient remains awake throughout the record.  Photic  stimulation induced driving a response.  EKG showed regular sinus rhythm  with ventricular response of 114 beats per minute.   IMPRESSION:  Normal record with the patient awake.      Deanna Artis. Sharene Skeans, M.D.  Electronically Signed    XBM:WUXL  D:  09/13/2006 01:30:27  T:  09/13/2006 10:34:21  Job #:  [id number]   cc:   Alvester Morin, M.D.  Fax: [phone]

## 2010-12-11 NOTE — Consult Note (Signed)
NAMEARLENNE, KIMBLEY NO.:  [id number]   MEDICAL RECORD NO.:  [id number]          PATIENT TYPE:  WOC   LOCATION:  WOC                          FACILITY:  [facility]   PHYSICIAN:  Bevelyn Buckles. Champey, M.D.DATE OF BIRTH:  Sep 25, 1965   DATE OF CONSULTATION:  DATE OF DISCHARGE:                                 CONSULTATION   Ms. Badour is a 45 year old African-American female with multiple  medical problems including HIV and AIDS who initially presented with  fatigue, shortness of breath, and dehydration.  The patient has been  followed at [facility] by Dr. Vickey Huger for tremor and has been on Mirapex for  this.  The patient was found during hospitalization to have tachycardia,  and Mirapex was stopped.  The patient states that Mirapex was not  providing her any benefit as well.  She does state that the tremor  occurs sporadically and usually occurs more with activity than at rest.  The tremor seems to be isolated to her right hand and upper extremity,  usually lasts less than one month in duration.  The patient also states  she feels unsteady on her feet at times with weakness in her legs and  off balance.  She also states she has some stuttering speech at times  and occasionally rolls her eyes back in her head.  She denies any vision  changes, focal weakness/numbness, swelling, problems chewing, falls, or  loss of consciousness.   PAST MEDICAL HISTORY:  1. HIV/AIDS.  2. Tremor.  3. Leukopenia.   CURRENT MEDICATIONS:  Morphine, Lyrica, aspirin, albuterol, diltiazem,  and Reglan.   ALLERGIES:  1. BACTRIM.  2. SULFAS.  3. VICODIN.  4. TRAMADOL.   FAMILY HISTORY:  Positive for diabetes, heart disease, and kidney  failure.   SOCIAL HISTORY:  The patient lives with her husband and smokes a half  pack of cigarettes per day, denies alcohol or drug use.  The patient  works as a Conservation officer, nature.   REVIEW OF SYSTEMS:  Positive as per HPI.  Other review of systems  negative as per  HPI in greater than seven other organ systems.   PHYSICAL EXAMINATION:  Temperature 99.8, blood pressure 102/74,  respirations 16, pulse 120, O2 saturation is 99%.  HEENT:  Normocephalic, atraumatic.  Extraocular muscles are intact.  NECK:  Supple, no carotid bruits.  HEART:  Tachycardic.  LUNGS:  Clear.  ABDOMEN:  Soft.  EXTREMITIES:  Good pulses.  NEUROLOGIC:  The patient is awake and alert.  Language is fluent.  The  patient is following commands appropriately.  Cranial Nerves II-XII are  grossly intact.  Motor examination is 4+ to 5, intact strength and  normal tone.  The patient has a right postural tremor noted.  No drift  is noted.  Sensory examination is within normal limits to light touch.  Reflexes are 1+ throughout.  Cerebellar function is within normal  limits.  Finger-to-nose gait was not assessed secondary to safety.  MRI  of the brain on December 2007 was essentially unremarkable.   LABORATORY DATA:  TSH was 0.573, hemoglobin 8.9, hematocrit is 23.3, WBC  is 3.6.   IMPRESSION:  This is a 45 year old African-American female with multiple  medical problems including tremor which seems like a postural or  essential tremor.  Her Mirapex has not stopped, and the patient states  this was not of benefit.  I recommend starting the patient on Primidone  25 mg p.o. q.h.s. for one week and increase to 50 mg q.h.s. for her  tremor.  Also check an EEG to rule out any possibility for partial  seizures and also a B12 level given her fatigue and weakness.  We will  discuss the case with Dr. Vickey Huger and retrieve her office records as  well.  The case was discussed with the primary team, and we will follow  the patient while she is in the hospital.      Bevelyn Buckles. Nash Shearer, M.D.  Electronically Signed     DRC/MEDQ  D:  09/10/2006  T:  09/11/2006  Job:  [id number]

## 2010-12-11 NOTE — Op Note (Signed)
NAME:  Claire Day, Claire Day                        ACCOUNT NO.:  [id number]   MEDICAL RECORD NO.:  [id number]                   PATIENT TYPE:  INP   LOCATION:  3302                                 FACILITY:  [facility]   PHYSICIAN:  Ines Bloomer, M.D.              DATE OF BIRTH:  Sep 10, 1965   DATE OF PROCEDURE:  01/07/2004  DATE OF DISCHARGE:                                 OPERATIVE REPORT   PREOPERATIVE DIAGNOSIS:  Empyema, left chest, left lower lobe pneumonia.   POSTOPERATIVE DIAGNOSIS:  Empyema, left chest, left lower lobe pneumonia.   OPERATION PERFORMED:  Left video assisted thoracoscopic surgery drainage of  pleural effusion, talc pleurodesis.   SURGEON:  Ines Bloomer, M.D.   ANESTHESIA:  General.   After the percutaneous insertion of all monitoring lines, the patient was  left lateral thoracotomy position and was prepped and draped in the usual  sterile manner.  A dual lumen tube was inserted.  Two trocar sites inserted  at the anterior to posterior axillary line at the seventh intercostal space.  The 0 degree scope was inserted and the patient had an inflammatory process  of empyema of the lower lobe.  We evacuated approximately 200 to 300 mL of  bloody fluid.  Then, using Kaiser ring forceps, the left upper lobe is freed  from the chest wall and then the left lower lobe was freed from the chest  wall as well as from the diaphragm.  Some exudate was freed off the left  lower lobe and then off the pleura and the diaphragm and the costophrenic  angle.  The area was copiously irrigated.  The left lower lobe had been  completely freed up and the left upper lobe had been freed up.  Then, a  third chest tube was brought in between these two which was a right angle  chest tube and sutured in place with 0 silk, two more 36 chest tubes were  placed at the anterior and posterior trocar sites and sutured into place  with 0 silks.  Pictures were taken to document the empyema.  A  Marcaine  block was done in the usual fashion.  The patient was returned to the  recovery room in stable condition.                                               Ines Bloomer, M.D.    DPB/MEDQ  D:  01/07/2004  T:  01/07/2004  Job:  [id number]

## 2010-12-11 NOTE — Discharge Summary (Signed)
NAME:  Claire Day, Claire Day                        ACCOUNT NO.:  [id number]   MEDICAL RECORD NO.:  [id number]                   PATIENT TYPE:  INP   LOCATION:  IC04                                 FACILITY:  [facility]   PHYSICIAN:  Hanley Hays. Dechurch, M.D.           DATE OF BIRTH:  02-Jan-1966   DATE OF ADMISSION:  12/26/2003  DATE OF DISCHARGE:  12/30/2003                                 DISCHARGE SUMMARY   DIAGNOSES:  1. Acute respiratory failure secondary to bilateral interstitial     infiltrates.  2. HIV positive.  3. Hypokalemia.  4. Acute renal failure.  5. Bilateral hydronephrosis.   DISPOSITION:  Patient transferred to [facility] for further  management.   HOSPITAL COURSE:  A 46 year old African American female with no local  physician who is HIV positive, presented to the emergency room complaining  of shortness of breath, was noted to be quite hypoxemic with left lower lobe  infiltrate on her chest x-ray.  She is known HIV positive but had not been  on any retroviral therapy since her move to this area 18 months prior.  She  was also noted to be in acute renal failure, which to her knowledge, was not  a problem in the past.  In addition to being pancytopenic, she was  hypokalemic and hypocalcemic.  Because of this constellation of findings, it  is felt that the patient most likely would require transfer to a facility  with infectious disease and other subspecialists that were readily  available.  Arrangements were made through Dr. Darlina Sicilian to transfer the  patient to [facility] when a bed was available.  In deed, that  apparently took 4 days to accomplish.  She was transferred when a bed became  available.  It appeared that she had received her initial antibiotics on  June 2 and June 3 and Levaquin was added to the regimen.  However, with the  impending transfer, antibiotics were not given on June 4 and June 5.  Given  her acute renal insufficiency, the initial  dose of Levaquin may have  provided some coverage.  It was resumed again on June 6.  Her renal function  had improved with hydration.  A bed was available, and again because of her  need for subspecialty management of her HIV, she was transferred after being  evaluated by the physicians covering the encompassed health care service  over the weekend.     ___________________________________________                                         Hanley Hays Josefine Class, M.D.   FED/MEDQ  D:  01/06/2004  T:  01/07/2004  Job:  [id number]

## 2010-12-11 NOTE — Discharge Summary (Signed)
Claire Day, Claire Day NO.:  [id number]   MEDICAL RECORD NO.:  [id number]          PATIENT TYPE:  INP   LOCATION:  3740                         FACILITY:  [facility]   PHYSICIAN:  Duncan Dull, M.D.     DATE OF BIRTH:  05-21-1966   DATE OF ADMISSION:  09/09/2006  DATE OF DISCHARGE:  09/14/2006                               DISCHARGE SUMMARY   DISCHARGE DIAGNOSES:  1. Sinus tachycardia secondary to severe malnutrition, deconditioning,      and chronic disease.  2. 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  3. 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  4. Generalized fatigue and malaise.  5. Human immunodeficiency virus/acquired immunodeficiency syndrome      with a CD-4 count less than 10.  Viral load and genotyping pending.  6. Leukopenia.  7. Anemia of chronic disease.  8. Cervical intraepithelial neoplasia, grade I.  9. Hypertension.  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  11.History of pneumocystis carinii pneumonia with empyema status post      video-assisted thoracoscopy procedure June2005.  12.Mildly decreased systolic function on echo, with no symptoms of      heart failure.   DISCHARGE MEDICATIONS:  1. Aspirin 81 mg p.o. daily.  2. Rayataz 300 mg p.o. daily.  3. Truvada 1 tablet p.o. daily.  4. Norvir 100 mg p.o. daily.  5. Mysoline (Primidone) 50 mg take 1/2 tablet p.o. nightly.  6. Lyrica 25 mg p.o. b.i.d.  7. Azithromycin 1200 mg p.o. weekly on Mondays.  8. Dapsone 100 mg p.o. daily.  9. Toprol 50 mg p.o. daily.   Of note, the patient was instructed to stop taking Mirapex.   DISPOSITION AND FOLLOW-UP:  The patient will follow up with her primary  care physician, Dr. Ninetta Lights, in [facility] on November 02, 2006 at 10:30 a.m.  Her tachycardia has been worked up and the patient  has been evaluated by cardiology who feels that  treating her low-normal  EF with a low dose beta-blocker is the best course to take at this  point.  On the day of discharge, her tremor is still present in her  right upper extremity episodically with the use of the arm; however, it  is much less than on admission.  In addition, her stuttering has  resolved.  Her eye rolling was still present but is much less than that  on admission.  Also, to Dr. Ninetta Lights her viral load and genotyping is  still pending at time of this dictation.   PROCEDURES PERFORMED:  2-D echocardiogram revealed overall left  ventricular systolic function mildly decreased with an ejection fraction  between 45-50%.  There was mild diffuse left ventricular hypokinesis.  Left ventricular wall thickness mildly increased.  Aortic valve mildly  thickened.  Mild mitral valve regurgitation.  Estimated peak pulmonary  artery systolic pressure mildly increased.  There was trivial  pericardial effusion posterior to heart.   EEG performed which was interpreted by neurology as normal; report  transcription is pending  at time of this dictation.   BRIEF ADMISSION HISTORY AND PHYSICAL:  Ms. Krawiec is a 45 year old  African American woman with HIV who was seen 4 days ago in clinic with  complaints of 3 weeks of fatigue, myalgias and in general feeling  overall that her health was declining.  The patient had been  experiencing some tremor over the last 1-2 months that has progressively  worsened.  The patient also notes that she has an increasingly worsening  stutter over the same time, as well as uncontrolled eye rolling.  She  has had decreased appetite during this time as well with decreased p.o.  intake.  She does endorse some shortness of breath as well as some left  lateral sided chest pain that was fleeting in nature and resolved on its  own.  She also complains of generalized weakness, primarily in the legs,  but also somewhat in the upper extremities.  She denies any known  sick  contacts.  Of note, the patient has been evaluated recently by Dr.  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   ADMISSION PHYSICAL EXAM:  Temperature 95.8, blood pressure 117/90, pulse  145, respiratory rate 16, O2 sats 95% on room air.  In general, this is  a very ill-appearing, cachectic woman.  She had prominent ptosis  bilaterally with early cataracts.  Pupils are equally round and  reactive.  Oropharynx revealed dry mucous membranes.  No signs of ulcers  or thrush.  Neck had no thyromegaly, no JVD.  She had faint crackles  bilaterally in the lower lobes of the lung.  Cardiac showed a prominent  PMI, tachycardia with a normal S1-S2.  No obvious murmurs, rubs or  gallops.  No JVD.  Abdomen was soft, flat, nontender, nondistended.  Extremities and skin revealed no rashes, petechiae or edema.  She had no  appreciable lymphadenopathy.  Neuro exam had cranial nerves II-XII are  grossly intact.  She had a questionable horizontal nystagmus.  She had  4+/5 strength in bilateral lower extremities, 5+/5 in bilateral upper  extremities.  She had a notable resting as well as intention tremor most  notable in the right upper extremity.  Cranial nerves II-XII were  grossly intact.  Gait was somewhat unsteady but normal.  Cerebellar  function was intact with finger-to-nose as well as heel-to-shin testing.  Reflexes were brisk but normal bilaterally.  She had no focal deficits  in strength or sensation.  She was alert and oriented and entirely was  appropriate to the situation.  She had a notable stutter with speech and  uncontrolled eye rolling and blinking.   ADMISSION LABS:  Sodium 136, potassium 4.3, chloride 106, bicarb 22, BUN  16, creatinine 0.9, glucose 86, bilirubin 1.7, alk phos 78, AST 30, ALT 21, protein 5.9, albumin 2.9, calcium 8.6.  White blood cell count 1.9,  hemoglobin 10.3, platelets  were unread secondary to clumping.  BNP was  138.  Stack cardiac markers were normal.  EKG revealed some questions of  T-wave inversion in V4 through V6; however, there was sinus tachycardia.  Other admission labs included a CD-4 count that was less than 10, an RPR  that was nonreactive, a UDS that was negative.  Normal coags.  Vitamin  B12 was 989, folate of 17.4, iron 48, TIBC of 277, percent sat of 17,  ferritin of 318, TSH of 0.57.  A urine that revealed  11-20 white blood  cells, 3-6 red blood cells and few bacteria.   HOSPITAL COURSE:  Problem 1:  TACHYCARDIA:  The patient was admitted and  started on Cardizem which did not resolve her tachycardia.  EKG revealed  normal sinus tachycardia with no changes concerning for acute ischemia.  Cardiac enzymes were cycled which were normal throughout.  Cardiology  consultation was obtained by Dr. Yates Decamp of [facility].  The patient underwent a 2-D echo which was evaluated  and reviewed by Dr. Jacinto Halim.  Essentially, the patient had mildly  decreased systolic function with some mild hypokinesis in the left  ventricle.  Dr. Jacinto Halim started the patient on low-dose beta blocker for  symptom control at this point.  In summary, the patient never reported  any angina or dyspnea on exertion that could be linked to coronary  artery disease.  Dr. 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  However, at this point, he feels the best course of management would be  symptomatic treatment with low dose beta-blocker again for the mildly  decreased systolic function (not the tachycardia).  Her sinus  tachycardia was felt to be secondary to hypermetabolic state, possibly  her anemia and some deconditioning.  At the time of discharge, the  patient's heart rate is typically 104-117.  Her blood pressure is stable  and the patient is asymptomatic and subjectively denies palpitations or  feelings of heart racing.  I spoke to Dr. Jacinto Halim  personally about the  results of the echo and the management of this patient and he felt quite  comfortable with this.   Problem 2:  ANEMIA:  This is likely anemia of chronic disease.  Hemoglobin on February16, 2008 was 8.9.  We wondered if this was  contributing to her tachycardia for which the patient received 2 units  of packed red blood cells.  Her hemoglobin responded appropriately and  was 11.2.  Following administration of PRBC, hemoglobin was stable  throughout.  Tranfusion did not resolve her tachycardia.   Problem 3:  HIV/AIDS:  Infectious disease consultation evaluated the  patient who was already receiving HAART; however, there was a question  of adherence.  Dr. Orvan Falconer on the day prior to discharge strongly  encouraged the patient to continue taking medications of which she  already had at home.  The patient is also being discharged on prophylaxis therapy.  Of note, she has a sulfa allergy and is being  discharged on dapsone rather than Bactrim.  Again, she will follow up  with her primary infectious disease physician, Dr. Ninetta Lights, in the first  week of April.   Problem 4:  RIGHT UPPER EXTREMITY TREMOR, EYE ROLLING AND UNCONTROLLED  BLINKING:  Neurology consultation was obtained.  The patient has already  been evaluated by Dr. Vickey Huger who felt this may be secondary to HIV  neuropathy as well as a question of a restless leg syndrome.  Of note,  the patient had been taking Mirapex which incidentally can cause  tachycardia.  Due to this and the fact that the patient felt no  subjective relief after taking Mirapex, we discontinued this medication.  Dr. Nash Shearer from neurology evaluated the patient and recommended  treatment with Mysoline at night.  She was started on this medications.  Subjectively, the patient felt that her tremor, eye rolling, and  blinking all continued to improve throughout this admission.  Whether  this was due to the effects of the beta blocker, the  Mysoline or just  the patient feeling in general somewhat better during this  hospitalization, I am not sure.  Regardless, an EEG was obtained, which  Dr. Nash Shearer reviewed as normal.  At this point, we will continue the  Mysoline as well as the Lyrica as the patient feels that she is much  improved subjectively.  Whether we will ever known exact etiology of  this I am unsure.   Problem 5:  HYPERTENSION:  Patient is being discharged on low-dose beta  blocker and her blood pressure has been controlled throughout this  hospitalization and on day of discharge is 132/81.   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 Ninetta Lights has been trying to  refer Ms. Sayler to gynecology for follow-up for this.   Problem 7:  LEUKOPENIA:  Serial CBCs revealed the patient had notable  leukopenia with white count of 2.7 to anywhere 3.3.  Almost certainly  this is related to her HIV illness.  Other than heart therapy and her  prophylaxis, I do not see any other than need to work this up or pursue  this any further.   DISCHARGE LABS AND VITALS:  On the day prior to discharge, her white  blood cell count 4.0, hemoglobin 11.3, platelets 191, MCV of 90.9.  A  sodium of 142, potassium 3.9, chloride 118, bicarb 20, BUN 19,  creatinine 0.87, glucose 72.  Vitals: Temperature of 97.6, blood  pressure 132/81, heart rate of 107-119, respiratory rate 20, O2 sats  were 99% on room air.      Antony Contras, M.D.  Electronically Signed      Duncan Dull, M.D.  Electronically Signed    GL/MEDQ  D:  09/14/2006  T:  09/15/2006  Job:  [id number]   cc:   Lacretia Leigh. Ninetta Lights, M.D.  Cristy Hilts. Jacinto Halim, MD  Bevelyn Buckles. Nash Shearer, M.D.
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
## 2010-12-11 NOTE — Consult Note (Signed)
NAME:  Claire Day, Claire Day                        ACCOUNT NO.:  [id number]   MEDICAL RECORD NO.:  [id number]                   PATIENT TYPE:  INP   LOCATION:  IC04                                 FACILITY:  [facility]   PHYSICIAN:  Jorja Loa, M.D.             DATE OF BIRTH:  Nov 25, 1965   DATE OF CONSULTATION:  12/27/2003  DATE OF DISCHARGE:                                   CONSULTATION   REASON FOR CONSULTATION:  Renal insufficiency.   Claire Day is a 45 year old African-American with past medical history of  acquired immune deficiency syndrome initially diagnosed about three years  ago in [place], [place].  Presently, came to the emergency room  with increased shortness of breath, cough and back pain and she was found to  have possible pneumonia.  The patient actually has been here for about 8-1/2  years.  She is not taking medication and she is not also being followed by  any physician.  At this moment, the patient denies any previous history of  kidney disease.  No history of hypertension.  No history of kidney stones.  No bladder infection.  Overall, she says that she is doing reasonably good.  She only takes ibuprofen once in a while.  Otherwise, no new complaints.   PAST MEDICAL HISTORY:  History of HIV positive, but no further history  available at this moment.  She denies any history of a ____________ from  before.   ALLERGIES:  No known allergy.   MEDICATIONS:  1. Calcium carbonate 500 mg p.o. t.i.d.  2. She is getting IV fluid ___________ mL per hour.  3. K-Dur 40 mEq p.o. two hours x2 dose of prednisone 40 mg p.o. b.i.d.  4. Bactrim 400 mg IV q.8h.  5. Lortab.   SOCIAL HISTORY:  No history of alcohol abuse, but patient smokes about one  pack a day and she is married.   REVIEW OF SYSTEMS:  At this moment is better.  She says the cough is better.  Also, the shortness of breath and appetite is also reasonable.  She denies  any chest pain.  No urgency or  frequency.  She states that she has two days  ago some diarrhea, but no complaints this morning.   PHYSICAL EXAMINATION:  VITAL SIGNS:  Temperature 97.1, blood pressure  155/54, pulse 130.  GENERAL:  Emaciated.  HEENT:  No conjunctivae or pallor.  Oral mucosa seems to be dry.  NECK:  Supple.  No jugular venous distention.  CHEST:  Clear to auscultation.  There is some wheezing on chest pain.  HEART:  Regular  rate and rhythm.  No murmurs.  ABDOMEN:  Soft.  Active bowel sounds.  EXTREMITIES:  No edema.   Her input is 600, output is 500.  Her blood gas pH 7.3, PCo2 54, saturation  18.5, white blood cell count 1.4, hemoglobin 10.9, hematocrit 51.5, and D-  dimer was 403.  When she came her sodium 151, potassium 3.1, BUN 84 and  creatinine 4.  Today, her sodium is 153, potassium 4, BUN 80, creatinine 2.6  and her calcium is 5.7.  Her culture is pending.  Chest x-ray lower lobe air  space disease compatible with pneumonia.   ASSESSMENT:  1. Renal insufficiency, at this moment seems to be acute.  This could be     simply from dehydration.  Her BUN and creatinine seems to be improving.     However,  ATN and other etiologies also cannot be ruled out.  At this     moment, I am not sure whether the patient has any previous history of HIV     nephropathy since there is no UA for this to be determined, and also     there is no additional workup.  2. Hypokalemia.  Possibly from dehydration from diarrhea and p.o. intake.     Her potassium has improved.  3. Hypocalcemia.  She is on calcium since she has low albumin.  The     hypocalcemia which looks very prominent could be also because of the     albumin, but still corrected albumin is still low about 7.6.  4. Pneumonia.  She is on antibiotics.  I am not sure whether this is     ___________ or something else, but patient on Bactrim and she seems to be     responding very well clinically.  5. Anemia.  Could be positive pancytopenia since the  patient has white blood     cell count of 1.4, anemia, and red blood cell count of 78.   RECOMMENDATIONS:  Will check UA.  I agree with hydration.  Will increase her  calcium to two tablets p.o. t.i.d. and I will run ultrasound of kidney.  Depending how her creatinine probably will do further workup.  Otherwise,  continue present management.      ___________________________________________                                            Jorja Loa, M.D.   BB/MEDQ  D:  12/27/2003  T:  12/27/2003  Job:  [id number]

## 2010-12-11 NOTE — H&P (Signed)
NAME:  DAVONA, KINOSHITA                        ACCOUNT NO.:  [id number]   MEDICAL RECORD NO.:  [id number]                   PATIENT TYPE:  INP   LOCATION:  A323                                 FACILITY:  [facility]   PHYSICIAN:  Tesfaye D. Felecia Shelling, M.D.              DATE OF BIRTH:  1965/12/08   DATE OF ADMISSION:  12/26/2003  DATE OF DISCHARGE:                                HISTORY & PHYSICAL   CHIEF COMPLAINT:  Cough, shortness of breath, and back pain.   HISTORY OF PRESENT ILLNESS:  This is a 45 year old black female who has a  history of acquired immunodeficiency syndrome who came to the emergency room  with the above complaint.  The patient found out that she was HIV positive  about three years ago in [place], [place], where she was started  on antiretroviral medication.  The patient moved to [place] about two  years ago.  Since she moved here, she took the medications only for about  six months; however, she was out of medication for the last 1-1/2 years, and  she did not see any physician.  About five days back, the patient started  having shortness of breath and a cough.  This was followed by back pain  about two days back.  She came to the emergency room, where she was  evaluated and there was found to have left lower lobe infiltrate on her  chest x-ray as well as hypoxemia on her blood gas.  The patient was started  on IV antibiotics, and she was admitted for further treatment.   REVIEW OF SYSTEMS:  The patient had no fever, chills, or chest pain.  The  patient has been losing weight progressively.  She has no nausea, vomiting,  or abdominal pain.  No headache, neck stiffness, or change in mental status.   PAST MEDICAL HISTORY:  1. HIV positive.  2. Status post cholecystectomy.   CURRENT MEDICATIONS:  Patient is not taking any medication at this time.   SOCIAL HISTORY:  The patient is married.  Her husband is also HIV positive.  The patient smokes about one pack  of cigarettes per day.  No history of  alcohol or substance abuse.   PHYSICAL EXAMINATION:  VITAL SIGNS:  Blood pressure 104/53, pulse 132,  respiratory rate 28, temperature 97 degrees Fahrenheit.  GENERAL:  Patient is awake and alert, acutely sick-looking.  Emaciated.  HEENT:  Pupils are equal and reactive.  NECK:  Supple.  LUNGS:  Decreased air entry.  Bilateral rhonchi.  HEART:  First and second heart sounds heard.  No murmur.  No gallop.  ABDOMEN:  Soft and lax.  Bowel sounds are positive.  No mass.  No  organomegaly.  EXTREMITIES:  No leg edema.   LABS:  ABG:  The pH of 7.39, pCO2 29.7, pO2 50.1, saturation 79.  WBC 1.4,  hemoglobin 10.9, hematocrit 31.5, and platelets 78.  Sodium 135,  potassium  3.1, chloride 99, carbon dioxide 21, glucose 55, BUN 84, creatinine 4.0.  Bilirubin 0.9, direct 0.6, alkaline phosphatase 62, SGOT 42, SGPT 20, total  protein 6.2, albumin 2.2, and calcium 6.0.   ASSESSMENT:  This is a 45 year old female patient with a history of human  immunodeficiency virus positive who came in with shortness of breath, cough,  and generalized weakness.  The patient has right lower lobe infiltrate.  The  patient has possibly community-acquired pneumonia.  The patient's human  immunodeficiency virus status and her CD4 count is not available at this  time.  The patient is obviously not on any antiretroviral medication for the  last six months.  She has also multiple electrolyte abnormalities and  pancytopenia as well as renal failure.   PLAN:  Will start the patient on a combination of antibiotics.  Will start  the patient on four liters of oxygen.  Will repeat blood gas.  Will start  her on IV fluids and will repeat her chemistry.  Will do a nephrology  consult.  Will do HIV workup.  The patient probably may need to be  transferred to the ID clinic for further evaluation.     ___________________________________________                                          Eustaquio Maize Felecia Shelling, M.D.   TDF/MEDQ  D:  12/26/2003  T:  12/26/2003  Job:  [id number]

## 2010-12-11 NOTE — Discharge Summary (Signed)
NAME:  Claire Day, Claire Day                        ACCOUNT NO.:  [id number]   MEDICAL RECORD NO.:  [id number]                   PATIENT TYPE:  INP   LOCATION:  5016                                 FACILITY:  [facility]   PHYSICIAN:  Manning Charity, MD                  DATE OF BIRTH:  [birth year]   DATE OF ADMISSION:  12/30/2003  DATE OF DISCHARGE:  01/15/2004                                 DISCHARGE SUMMARY   DISCHARGE DIAGNOSES:  1. Strep pneumonia with empyema, status post video assisted thoracic     surgery.  2. Pancytopenia.  3. Human immunodeficiency virus positive.  4. Thrush.  5. Bilateral hydronephrosis.   DISCHARGE MEDICATIONS:  1. Bactrim DS one tablet on Monday, Wednesday, Friday.  2. Azithromycin 1200 mg q. week.  3. Avelox 100 mg daily for seven days.  4. Darvocet-N 100 one tablet q.4h. p.r.n. pain.   FOLLOW UP:  The patient has an appointment on February 04, 2004, in the internal  medicine outpatient clinic at 11:10 and a follow-up appointment with Dr.  Edwyna Shell on January 29, 2004, at 3:30 p.m.   CONSULTATION:  Ines Bloomer, M.D., cardiovascular thoracic surgery, was  consulted regarding empyema in an HIV positive woman with strep pneumonia.   PROCEDURES:  1. The patient had a renal ultrasound performed on December 27, 2003, which     revealed bilateral hydronephrosis, right greater than left.  2. The patient had an ultrasound of the chest done to follow up on fluid     seen on chest x-ray which was negative for any definitive fluid.  3. CT scan of the chest was performed with contrast on January 06, 2004, which     revealed a moderate size left pleural effusion with left lower lobe     pneumonia and a patchy right lower lobe pneumonia.  4. The patient had a repeat renal ultrasound performed on January 06, 2004,     which revealed stable mild to moderate bilateral hydronephrosis again     with right greater than left.  5. The patient had a VATS procedure performed on January 07, 2004,  with about     200 to 300 mL of bloody fluid removed.  Pleural fluid analysis was with a     glucose of 77, protein of 3.8, LDL 255, amylase of 166, white blood cell     count of 300 and 65% neutrophils.   HISTORY OF PRESENT ILLNESS:  For details of HPI and presenting physical  examination, please see admission H&P of December 26, 2003.  In summary, this is  a 45 year old African American female with untreated HIV infection who  presented to [facility] Emergency Room with cough, shortness of  breath, and back pain.  At that time, she was found to have a left lower  lobe infiltrate and was hypoxic, pancytopenic with a CD4 count of 30.   Vital  signs on admission were within normal limits.  In general, she was  dyspneic.  She was found to have thrush.  Respiratory examination was with  crackles bilaterally, right greater than left and tachypneic.  Otherwise  physical examination was within normal limits.   ADMISSION LABORATORY DATA:  Sodium 137, potassium 3.8, chloride 107, bicarb  19, BUN 28, creatinine 1.1, glucose 109.  White blood cell count at the time  of admission was 1.4, hemoglobin 10.9, platelet count 78,000.  ABG was with  a pH of 7.42, pCO2 29,7, and pO2 67.6 on 60% face mask.  LDH was 337.  CD4  count was 30.   Chest x-ray revealed significant interval increase in diffuse bilateral  interstitial infiltrates, greatest in the left lower lobe, small  parapneumonic left pleural effusion.  Renal ultrasound with results as noted  above.   HOSPITAL COURSE:  PROBLEM #1 -  STREP PNEUMONIA:  The patient was started on  broad spectrum antibiotics, was also treated empirically initially for PCP,  and placed in respiratory isolation.  Once acid fast cultures returned  negative, PCP treatment and respiratory isolation was discontinued.  She was  receiving a total of 16 days of IV antibiotics.  She was then switched to  Avelox which she was discharged on.  During hospital course she  was found to  have consolidation in the left lower lobe.  Ultrasound of the chest was  performed with results as noted above.  CT scan was then performed with  results as noted above.  Dr. Edwyna Shell was consulted.  He performed  thoracentesis to drain empyema.  Chest tubes were removed without any  further complications or reaccumulation.   PROBLEM #2 -  PANCYTOPENIA:  Multiple labs were checked to follow up on the  pancytopenia including retic count which was slightly decreased at 70.1 with  2.1% reticulocytes.  B12 and folate were within normal limits.  It was  believed that her pancytopenia was secondary to her HIV infection as well as  acute bone marrow suppression from bacteremia which needs to be followed up  further as an outpatient.   PROBLEM #3 -  ACQUIRED IMMUNODEFICIENCY SYNDROME:  The patient had stopped  taking her HAART therapy on her own when she moved to [place], [place], because she did not want anyone knowing about her disease.  It was  decided that she should be restarted on HAART therapy approximately two  months after discharge.  She is to follow up in the internal medicine  clinic.  Prior to this, they restarted to insure compliance.   PROBLEM #4 -  THRUSH:  Treated with IV Diflucan.  At the time of discharge,  was free of any signs of thrush.   PROBLEM #5 -  HYDRONEPHROSIS:  CT scan of the abdomen needs to be repeated  in approximately two months after discharge to rule out lymphoma or other  obstructive cause for hydronephrosis.  The patient's creatinine and urine  output were stable during admission.   All other problems were stable during this admission.  The patient was  discharged to home with her husband in fair condition.  She is to follow up  with the internal medicine outpatient clinic and Dr. Edwyna Shell as noted above.  Manning Charity, MD    KK/MEDQ  D:  03/11/2004  T:  03/12/2004  Job:  [id number]

## 2011-01-26 ENCOUNTER — Telehealth: Payer: Self-pay | Admitting: *Deleted

## 2011-01-26 NOTE — Telephone Encounter (Signed)
I rec'd a fax from [facility] about no recent refills. I called the pt who states she is being followed by Dr. Darnelle Bos in [place] for her HIV

## 2011-02-05 ENCOUNTER — Telehealth: Payer: Self-pay | Admitting: *Deleted

## 2011-02-05 ENCOUNTER — Other Ambulatory Visit: Payer: Self-pay | Admitting: *Deleted

## 2011-02-05 NOTE — Telephone Encounter (Signed)
[facility] pharmacy faxed request for refill on pts. Lisinopril.  Notified them that patient has transferred care to [place], [place] Wendall Mola CMA

## 2011-04-21 LAB — T-HELPER CELL (CD4) - (RCID CLINIC ONLY): CD4 % Helper T Cell: 13 — ABNORMAL LOW

## 2011-04-26 LAB — T-HELPER CELL (CD4) - (RCID CLINIC ONLY)
CD4 % Helper T Cell: 12 — ABNORMAL LOW
CD4 T Cell Abs: 240 — ABNORMAL LOW

## 2011-05-10 LAB — BASIC METABOLIC PANEL
CO2: 31
Calcium: 8.7
GFR calc Af Amer: >60
GFR calc non Af Amer: >60
Glucose, Bld: 83
Potassium: 3.7
Sodium: 140

## 2011-05-10 LAB — HEPATIC FUNCTION PANEL
AST: 18
Albumin: 3.4 — ABNORMAL LOW
Alkaline Phosphatase: 111
Bilirubin, Direct: 0.1
Total Bilirubin: 0.9

## 2011-05-10 LAB — DIFFERENTIAL
Eosinophils Relative: 4
Lymphocytes Relative: 27
Monocytes Absolute: 0.5
Monocytes Relative: 10
Neutro Abs: 3

## 2011-05-10 LAB — URINALYSIS, ROUTINE W REFLEX MICROSCOPIC
Leukocytes, UA: NEGATIVE
Nitrite: NEGATIVE
Protein, ur: NEGATIVE
Urobilinogen, UA: 0.2

## 2011-05-10 LAB — CBC
HCT: 38.4
Hemoglobin: 12.9
MCHC: 33.6
RBC: 4.37
RDW: 15.6 — ABNORMAL HIGH

## 2011-05-10 LAB — T-HELPER CELL (CD4) - (RCID CLINIC ONLY)
CD4 % Helper T Cell: 7 — ABNORMAL LOW
CD4 T Cell Abs: 100 — ABNORMAL LOW

## 2011-05-10 LAB — URINE MICROSCOPIC-ADD ON

## 2012-07-31 ENCOUNTER — Emergency Department (HOSPITAL_COMMUNITY)
Admission: EM | Admit: 2012-07-31 | Discharge: 2012-07-31 | Disposition: A | Discharge status: Home / Self Care | Payer: Medicare Other | Attending: Emergency Medicine | Admitting: Emergency Medicine

## 2012-07-31 ENCOUNTER — Encounter (HOSPITAL_COMMUNITY): Payer: Self-pay | Admitting: Emergency Medicine

## 2012-07-31 DIAGNOSIS — I10 Essential (primary) hypertension: Secondary | ICD-10-CM | POA: Insufficient documentation

## 2012-07-31 DIAGNOSIS — Z79899 Other long term (current) drug therapy: Secondary | ICD-10-CM | POA: Insufficient documentation

## 2012-07-31 DIAGNOSIS — Z3202 Encounter for pregnancy test, result negative: Secondary | ICD-10-CM | POA: Insufficient documentation

## 2012-07-31 DIAGNOSIS — F17200 Nicotine dependence, unspecified, uncomplicated: Secondary | ICD-10-CM | POA: Insufficient documentation

## 2012-07-31 DIAGNOSIS — N390 Urinary tract infection, site not specified: Secondary | ICD-10-CM | POA: Insufficient documentation

## 2012-07-31 DIAGNOSIS — R112 Nausea with vomiting, unspecified: Secondary | ICD-10-CM | POA: Insufficient documentation

## 2012-07-31 DIAGNOSIS — A5900 Urogenital trichomoniasis, unspecified: Secondary | ICD-10-CM | POA: Insufficient documentation

## 2012-07-31 DIAGNOSIS — M549 Dorsalgia, unspecified: Secondary | ICD-10-CM

## 2012-07-31 DIAGNOSIS — Z21 Asymptomatic human immunodeficiency virus [HIV] infection status: Secondary | ICD-10-CM | POA: Insufficient documentation

## 2012-07-31 HISTORY — DX: Human immunodeficiency virus (HIV) disease: B20

## 2012-07-31 HISTORY — DX: Asymptomatic human immunodeficiency virus (hiv) infection status: Z21

## 2012-07-31 HISTORY — DX: Essential (primary) hypertension: I10

## 2012-07-31 LAB — URINALYSIS, ROUTINE W REFLEX MICROSCOPIC
Bilirubin Urine: NEGATIVE
Glucose, UA: 100 mg/dL — AB
Ketones, ur: NEGATIVE mg/dL
Protein, ur: 100 mg/dL — AB
Urobilinogen, UA: 1 mg/dL (ref 0.0–1.0)

## 2012-07-31 LAB — URINE MICROSCOPIC-ADD ON

## 2012-07-31 MED ORDER — METRONIDAZOLE 500 MG PO TABS: 500.0000 mg | ORAL_TABLET | Freq: Two times a day (BID) | ORAL | Status: DC

## 2012-07-31 MED ORDER — METHOCARBAMOL 500 MG PO TABS: 500.0000 mg | ORAL_TABLET | Freq: Two times a day (BID) | ORAL | Status: DC

## 2012-07-31 MED ORDER — KETOROLAC TROMETHAMINE 30 MG/ML IJ SOLN
30.0000 mg | Freq: Once | INTRAMUSCULAR | Status: AC
  Administered 2012-07-31: 30 mg via INTRAVENOUS
  Filled 2012-07-31: qty 1

## 2012-07-31 MED ORDER — ONDANSETRON HCL 4 MG/2ML IJ SOLN
4.0000 mg | Freq: Once | INTRAMUSCULAR | Status: AC
  Administered 2012-07-31: 4 mg via INTRAVENOUS
  Filled 2012-07-31: qty 2

## 2012-07-31 MED ORDER — HYDROCODONE-ACETAMINOPHEN 5-325 MG PO TABS: 1.0000 | ORAL_TABLET | Freq: Four times a day (QID) | ORAL | Status: DC | PRN

## 2012-07-31 MED ORDER — CEPHALEXIN 500 MG PO CAPS: 500.0000 mg | ORAL_CAPSULE | Freq: Four times a day (QID) | ORAL | Status: DC

## 2012-07-31 MED ORDER — SODIUM CHLORIDE 0.9 % IV BOLUS (SEPSIS)
1000.0000 mL | Freq: Once | INTRAVENOUS | Status: AC
  Administered 2012-07-31: 1000 mL via INTRAVENOUS

## 2012-07-31 NOTE — ED Notes (Signed)
Pt states she has been having low back pain since Saturday.  Also has been having NV x 2 days.  Pain 7/10.  States she has had similar pain before and it was a UTI but did not have the NV.  Denies dysuria.

## 2012-07-31 NOTE — ED Provider Notes (Signed)
History     CSN: [id number]  Arrival date & time 07/31/12  4098   First MD Initiated Contact with Patient 07/31/12 [phone]      Chief Complaint  Patient presents with  . Back Pain  . Nausea  . Emesis    (Consider location/radiation/quality/duration/timing/severity/associated sxs/prior treatment) HPI Comments: This is a 49 rolled female, who presents emergency department with chief complaint of low back pain that has been constant since Saturday. Patient additionally endorses nausea and vomiting x2 days. She states the back pain is 7/10. The pain does not radiate. She states that it feels similar to when she had a UTI in the past. She denies dysuria and vaginal discharge.  She has tried taking ibuprofen with some relief.  The history is provided by the patient. No language interpreter was used.    Past Medical History  Diagnosis Date  . Hypertension   . HIV (human immunodeficiency virus infection)     Past Surgical History  Procedure Date  . Cholecystectomy     History reviewed. No pertinent family history.  History  Substance Use Topics  . Smoking status: Current Every Day Smoker -- 0.5 packs/day    Types: Cigarettes  . Smokeless tobacco: Not on file  . Alcohol Use: No    OB History    Grav Para Term Preterm Abortions TAB SAB Ect Mult Living                  Review of Systems  All other systems reviewed and are negative.    Allergies  Sulfamethoxazole w-trimethoprim and Sulfonamide derivatives  Home Medications   Current Outpatient Rx  Name  Route  Sig  Dispense  Refill  . AMLODIPINE BESYLATE 10 MG PO TABS   Oral   Take 10 mg by mouth daily.         . ATAZANAVIR SULFATE 300 MG PO CAPS   Oral   Take 300 mg by mouth daily.         Marland Kitchen EMTRICITABINE-TENOFOVIR 200-300 MG PO TABS   Oral   Take 1 tablet by mouth daily.         Marland Kitchen METOPROLOL TARTRATE 25 MG PO TABS   Oral   Take 25 mg by mouth daily.         Marland Kitchen RITONAVIR 100 MG PO TABS   Oral  Take 100 mg by mouth daily.         . CEPHALEXIN 500 MG PO CAPS   Oral   Take 1 capsule (500 mg total) by mouth 4 (four) times daily.   40 capsule   0   . HYDROCODONE-ACETAMINOPHEN 5-325 MG PO TABS   Oral   Take 1 tablet by mouth every 6 (six) hours as needed for pain.   10 tablet   0   . METHOCARBAMOL 500 MG PO TABS   Oral   Take 1 tablet (500 mg total) by mouth 2 (two) times daily.   20 tablet   0   . METRONIDAZOLE 500 MG PO TABS   Oral   Take 1 tablet (500 mg total) by mouth 2 (two) times daily.   14 tablet   0     BP 135/86  Pulse 81  Temp 98.5 F (36.9 C) (Oral)  Resp 18  SpO2 99%  Physical Exam  Nursing note and vitals reviewed. Constitutional: She is oriented to person, place, and time. She appears well-developed and well-nourished.  HENT:  Head: Normocephalic and atraumatic.  Eyes: Conjunctivae normal and EOM are normal. Pupils are equal, round, and reactive to light.  Neck: Normal range of motion. Neck supple.  Cardiovascular: Normal rate and regular rhythm.  Exam reveals no gallop and no friction rub.   No murmur heard. Pulmonary/Chest: Effort normal and breath sounds normal. No respiratory distress. She has no wheezes. She has no rales. She exhibits no tenderness.  Abdominal: Soft. Bowel sounds are normal. She exhibits no distension and no mass. There is no tenderness. There is no rebound and no guarding.  Musculoskeletal: Normal range of motion. She exhibits tenderness. She exhibits no edema.       Tenderness to palpation over lumbar paraspinal muscles on the left, no remarkable CVA tenderness, no numbness, weakness, or tingling of the lower extremities  Neurological: She is alert and oriented to person, place, and time.  Skin: Skin is warm and dry.  Psychiatric: She has a normal mood and affect. Her behavior is normal. Judgment and thought content normal.    ED Course  Procedures (including critical care time)  Labs Reviewed  URINALYSIS, ROUTINE  W REFLEX MICROSCOPIC - Abnormal; Notable for the following:    APPearance CLOUDY (*)     Glucose, UA 100 (*)     Hgb urine dipstick SMALL (*)     Protein, ur 100 (*)     Leukocytes, UA SMALL (*)     All other components within normal limits  POCT PREGNANCY, URINE  URINE MICROSCOPIC-ADD ON  URINE CULTURE   Results for orders placed during the hospital encounter of 07/31/12  URINALYSIS, ROUTINE W REFLEX MICROSCOPIC      Component Value Range   Color, Urine YELLOW  YELLOW   APPearance CLOUDY (*) CLEAR   Specific Gravity, Urine 1.013  1.005 - 1.030   pH 6.0  5.0 - 8.0   Glucose, UA 100 (*) NEGATIVE mg/dL   Hgb urine dipstick SMALL (*) NEGATIVE   Bilirubin Urine NEGATIVE  NEGATIVE   Ketones, ur NEGATIVE  NEGATIVE mg/dL   Protein, ur 191 (*) NEGATIVE mg/dL   Urobilinogen, UA 1.0  0.0 - 1.0 mg/dL   Nitrite NEGATIVE  NEGATIVE   Leukocytes, UA SMALL (*) NEGATIVE  POCT PREGNANCY, URINE      Component Value Range   Preg Test, Ur NEGATIVE  NEGATIVE  URINE MICROSCOPIC-ADD ON      Component Value Range   Squamous Epithelial / LPF RARE  RARE   WBC, UA 11-20  <3 WBC/hpf   RBC / HPF 0-2  <3 RBC/hpf   Bacteria, UA RARE  RARE   Urine-Other TRICHOMONAS PRESENT         1. Back pain   2. UTI (lower urinary tract infection)       MDM  This is a 46 rolled female with back pain and a UTI. Will treat the patient with some pain pills, Robaxin, and will treat the UTI with Keflex. This patient also has Trichomonas in her urine, will additionally give metronidazole. This patient has been seen by and discussed with Dr. Denton Lank, who agrees with the plan. The patient understands and agrees with the plan. She is stable and ready for discharge.        Roxy Horseman, PA-C 07/31/12 1137

## 2012-07-31 NOTE — ED Notes (Signed)
MD at bedside. 

## 2012-07-31 NOTE — ED Notes (Signed)
PA at bedside.

## 2012-08-01 LAB — URINE CULTURE: Special Requests: NORMAL

## 2012-08-01 NOTE — ED Provider Notes (Signed)
Medical screening examination/treatment/procedure(s) were conducted as a shared visit with non-physician practitioner(s) and myself.  I personally evaluated the patient during the encounter Pt c/o lower back pain. No numbness/weakness. No abd pain or fever. Few wbc on ua. No cva tenderness. Spine nt.   Suzi Roots, MD 08/01/12 [phone]

## 2014-04-22 ENCOUNTER — Encounter (HOSPITAL_COMMUNITY): Payer: Self-pay | Admitting: Emergency Medicine

## 2014-04-22 ENCOUNTER — Emergency Department (HOSPITAL_COMMUNITY)
Admission: EM | Admit: 2014-04-22 | Discharge: 2014-04-23 | Disposition: A | Discharge status: Home / Self Care | Payer: Medicare Other | Attending: Emergency Medicine | Admitting: Emergency Medicine

## 2014-04-22 DIAGNOSIS — I10 Essential (primary) hypertension: Secondary | ICD-10-CM | POA: Insufficient documentation

## 2014-04-22 DIAGNOSIS — Z9089 Acquired absence of other organs: Secondary | ICD-10-CM | POA: Insufficient documentation

## 2014-04-22 DIAGNOSIS — R109 Unspecified abdominal pain: Secondary | ICD-10-CM | POA: Diagnosis present

## 2014-04-22 DIAGNOSIS — F17200 Nicotine dependence, unspecified, uncomplicated: Secondary | ICD-10-CM | POA: Diagnosis not present

## 2014-04-22 DIAGNOSIS — Z79899 Other long term (current) drug therapy: Secondary | ICD-10-CM | POA: Diagnosis not present

## 2014-04-22 DIAGNOSIS — N23 Unspecified renal colic: Secondary | ICD-10-CM | POA: Diagnosis not present

## 2014-04-22 DIAGNOSIS — Z21 Asymptomatic human immunodeficiency virus [HIV] infection status: Secondary | ICD-10-CM | POA: Diagnosis not present

## 2014-04-22 LAB — URINALYSIS, ROUTINE W REFLEX MICROSCOPIC
BILIRUBIN URINE: NEGATIVE
Glucose, UA: NEGATIVE mg/dL
Hgb urine dipstick: NEGATIVE
Ketones, ur: NEGATIVE mg/dL
Leukocytes, UA: NEGATIVE
NITRITE: NEGATIVE
PROTEIN: NEGATIVE mg/dL
UROBILINOGEN UA: 0.2 mg/dL (ref 0.0–1.0)
pH: 6 (ref 5.0–8.0)

## 2014-04-22 NOTE — ED Notes (Signed)
Pt c/o right flank pain that started x 2 hours ago; pt c/o urinary frequency and unable to void but small amounts

## 2014-04-23 ENCOUNTER — Emergency Department (HOSPITAL_COMMUNITY): Payer: Medicare Other

## 2014-04-23 DIAGNOSIS — N23 Unspecified renal colic: Secondary | ICD-10-CM | POA: Diagnosis not present

## 2014-04-23 LAB — CBC WITH DIFFERENTIAL/PLATELET
BASOS ABS: 0 10*3/uL (ref 0.0–0.1)
BASOS PCT: 0 % (ref 0–1)
EOS ABS: 0.1 10*3/uL (ref 0.0–0.7)
EOS PCT: 2 % (ref 0–5)
HCT: 41.5 % (ref 36.0–46.0)
Hemoglobin: 14 g/dL (ref 12.0–15.0)
LYMPHS ABS: 1.5 10*3/uL (ref 0.7–4.0)
Lymphocytes Relative: 28 % (ref 12–46)
MCH: 30.2 pg (ref 26.0–34.0)
MCHC: 33.7 g/dL (ref 30.0–36.0)
MCV: 89.6 fL (ref 78.0–100.0)
Monocytes Absolute: 0.5 10*3/uL (ref 0.1–1.0)
Monocytes Relative: 10 % (ref 3–12)
NEUTROS PCT: 60 % (ref 43–77)
Neutro Abs: 3.3 10*3/uL (ref 1.7–7.7)
PLATELETS: 130 10*3/uL — AB (ref 150–400)
RBC: 4.63 MIL/uL (ref 3.87–5.11)
RDW: 13.6 % (ref 11.5–15.5)
WBC: 5.4 10*3/uL (ref 4.0–10.5)

## 2014-04-23 LAB — BASIC METABOLIC PANEL
ANION GAP: 9 (ref 5–15)
BUN: 18 mg/dL (ref 6–23)
CALCIUM: 8.7 mg/dL (ref 8.4–10.5)
CO2: 23 mEq/L (ref 19–32)
Chloride: 109 mEq/L (ref 96–112)
Creatinine, Ser: 1.61 mg/dL — ABNORMAL HIGH (ref 0.50–1.10)
GFR, EST AFRICAN AMERICAN: 43 mL/min — AB (ref >90)
GFR, EST NON AFRICAN AMERICAN: 37 mL/min — AB (ref >90)
GLUCOSE: 84 mg/dL (ref 70–99)
POTASSIUM: 4.3 meq/L (ref 3.7–5.3)
SODIUM: 141 meq/L (ref 137–147)

## 2014-04-23 MED ORDER — FENTANYL CITRATE 0.05 MG/ML IJ SOLN
100.0000 ug | Freq: Once | INTRAMUSCULAR | Status: AC
  Administered 2014-04-23: 100 ug via INTRAVENOUS
  Filled 2014-04-23: qty 2

## 2014-04-23 MED ORDER — HYDROMORPHONE HCL 2 MG PO TABS: 2.0000 mg | ORAL_TABLET | ORAL | Status: DC | PRN

## 2014-04-23 NOTE — ED Provider Notes (Signed)
CSN: [id number]     Arrival date & time 04/22/14  2153 History  This chart was scribed for Hanley Seamen, MD by Tonye Royalty, ED Scribe. This patient was seen in room APA11/APA11 and the patient's care was started at 12:47 AM.    Chief Complaint  Patient presents with  . Flank Pain   HPI  HPI Comments: Claire Day is a 48 y.o. female who presents to the Emergency Department complaining of right flank pain with sudden onset at 8:00PM yesterday evening. She rates pain at 10/10 and states it is worse when moving but better when staying still. She states pain is also slightly worse with breathing. She rates pain now at 4-5/10 while still. She reports associated urinary frequency. She denies having similar symptoms previously. She denies nausea, vomiting, diarrhea, fever or chills.  Past Medical History  Diagnosis Date  . Hypertension   . HIV (human immunodeficiency virus infection)    Past Surgical History  Procedure Laterality Date  . Cholecystectomy     History reviewed. No pertinent family history. History  Substance Use Topics  . Smoking status: Current Every Day Smoker -- 0.50 packs/day    Types: Cigarettes  . Smokeless tobacco: Not on file  . Alcohol Use: No   OB History   Grav Para Term Preterm Abortions TAB SAB Ect Mult Living                 Review of Systems A complete 10 system review of systems was obtained and all systems are negative except as noted in the HPI and PMH.    Allergies  Sulfamethoxazole-trimethoprim  Home Medications   Prior to Admission medications   Medication Sig Start Date End Date Taking? Authorizing Provider  atazanavir (REYATAZ) 150 MG capsule Take 300 mg by mouth 2 (two) times daily.   Yes Historical Provider, MD  Raltegravir Potassium (ISENTRESS PO) Take 1 tablet by mouth 2 (two) times daily.   Yes Historical Provider, MD  ritonavir (NORVIR) 100 MG TABS Take 100 mg by mouth daily.   Yes Historical Provider, MD   BP 179/93  Pulse 89   Temp(Src) 98.4 F (36.9 C) (Oral)  Resp 20  Ht 5\' 7"  (1.702 m)  Wt 105 lb (47.628 kg)  BMI 16.44 kg/m2  SpO2 100%  Physical Exam  General: Well-developed, well-nourished female in no acute distress; appearance consistent with age of record HENT: normocephalic; atraumatic Eyes: pupils equal, round and reactive to light; extraocular muscles intact Neck: supple Heart: regular rate and rhythm; no murmurs, rubs or gallops Lungs: clear to auscultation bilaterally Abdomen: soft; nondistended; nontender; no masses or hepatosplenomegaly; bowel sounds present GU: no CVA tenderness Extremities: No deformity; full range of motion; pulses normal, negative straight leg raise Neurologic: Awake, alert and oriented; motor function intact in all extremities and symmetric; no facial droop Skin: Warm and dry Psychiatric: Normal mood and affect Back: right flank pain on movement of back    ED Course  Procedures   DIAGNOSTIC STUDIES: Oxygen Saturation is 100% on room air, normal by my interpretation.    COORDINATION OF CARE: 12:51 AM Discussed treatment plan with patient at beside, the patient agrees with the plan and has no further questions at this time.   MDM   Nursing notes and vitals signs, including pulse oximetry, reviewed.  Summary of this visit's results, reviewed by myself:  Labs:  Results for orders placed during the hospital encounter of 04/22/14 (from the past 24 hour(s))  URINALYSIS,  ROUTINE W REFLEX MICROSCOPIC     Status: Abnormal   Collection Time    04/22/14 10:09 PM      Result Value Ref Range   Color, Urine YELLOW  YELLOW   APPearance CLEAR  CLEAR   Specific Gravity, Urine <1.005 (*) 1.005 - 1.030   pH 6.0  5.0 - 8.0   Glucose, UA NEGATIVE  NEGATIVE mg/dL   Hgb urine dipstick NEGATIVE  NEGATIVE   Bilirubin Urine NEGATIVE  NEGATIVE   Ketones, ur NEGATIVE  NEGATIVE mg/dL   Protein, ur NEGATIVE  NEGATIVE mg/dL   Urobilinogen, UA 0.2  0.0 - 1.0 mg/dL   Nitrite  NEGATIVE  NEGATIVE   Leukocytes, UA NEGATIVE  NEGATIVE  CBC WITH DIFFERENTIAL     Status: Abnormal   Collection Time    04/23/14 12:10 AM      Result Value Ref Range   WBC 5.4  4.0 - 10.5 K/uL   RBC 4.63  3.87 - 5.11 MIL/uL   Hemoglobin 14.0  12.0 - 15.0 g/dL   HCT 21.341.5  08.636.0 - 57.846.0 %   MCV 89.6  78.0 - 100.0 fL   MCH 30.2  26.0 - 34.0 pg   MCHC 33.7  30.0 - 36.0 g/dL   RDW 46.913.6  62.911.5 - 52.815.5 %   Platelets 130 (*) 150 - 400 K/uL   Neutrophils Relative % 60  43 - 77 %   Neutro Abs 3.3  1.7 - 7.7 K/uL   Lymphocytes Relative 28  12 - 46 %   Lymphs Abs 1.5  0.7 - 4.0 K/uL   Monocytes Relative 10  3 - 12 %   Monocytes Absolute 0.5  0.1 - 1.0 K/uL   Eosinophils Relative 2  0 - 5 %   Eosinophils Absolute 0.1  0.0 - 0.7 K/uL   Basophils Relative 0  0 - 1 %   Basophils Absolute 0.0  0.0 - 0.1 K/uL  BASIC METABOLIC PANEL     Status: Abnormal   Collection Time    04/23/14 12:10 AM      Result Value Ref Range   Sodium 141  137 - 147 mEq/L   Potassium 4.3  3.7 - 5.3 mEq/L   Chloride 109  96 - 112 mEq/L   CO2 23  19 - 32 mEq/L   Glucose, Bld 84  70 - 99 mg/dL   BUN 18  6 - 23 mg/dL   Creatinine, Ser 4.131.61 (*) 0.50 - 1.10 mg/dL   Calcium 8.7  8.4 - 24.410.5 mg/dL   GFR calc non Af Amer 37 (*) >90 mL/min   GFR calc Af Amer 43 (*) >90 mL/min   Anion gap 9  5 - 15    Imaging Studies: Ct Abdomen Pelvis Wo Contrast  04/23/2014   CLINICAL DATA:  Right flank pain.  EXAM: CT ABDOMEN AND PELVIS WITHOUT CONTRAST  TECHNIQUE: Multidetector CT imaging of the abdomen and pelvis was performed following the standard protocol without IV contrast.  COMPARISON:  02/16/2007  FINDINGS: Linear scarring or atelectasis in the lung bases, similar to prior study.  Right-sided hydronephrosis and hydroureter to the bladder. No radiopaque stone is identified. Changes could be due to stricture, non radiopaque stone, or residual inflammatory changes from recently passed stone. No hydronephrosis or hydroureter in the left  kidney. Mild left renal atrophy. No left renal or ureteral stones identified. Bladder is decompressed and no bladder stones are visualized.  Surgical absence of the gallbladder. Calcified granulomas in  the spleen. The unenhanced appearance of the pancreas, adrenal glands, abdominal aorta, inferior vena cava, and retroperitoneal lymph nodes is unremarkable. 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 This is unchanged since previous study. Bowel staples are demonstrated in the right abdomen. Stomach, stomach and small bowel are decompressed. Gas and stool in the colon without distention. No free air or free fluid in the abdomen.  Pelvis: Stool-filled rectosigmoid colon. No free or loculated pelvic fluid collections. Appendix is segmentally visualized and appears normal. No evidence of diverticulitis. No pelvic lymphadenopathy. Uterus is not abnormally enlarged. No destructive bone lesions.  IMPRESSION: Right-sided hydronephrosis and hydroureter to the bladder. No radiopaque stone identified. Changes could indicate stricture common non radiopaque stone or residual inflammatory changes from recently passed stone. Left kidney is unremarkable. 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 No change.   Electronically Signed   By: Burman Nieves M.D.   On: 04/23/2014 01:37   2:00 AM Pain improved with IV Dilaudid. Patient likely passing radiolucent stone which is common with protease inhibitors. She has a urologist in [place] with whom she will followup.  I personally performed the services described in this documentation, which was scribed in my presence. The recorded information has been reviewed and is accurate.    Hanley Seamen, MD 04/23/14 0201

## 2014-04-23 NOTE — Discharge Instructions (Signed)

## 2017-09-20 DIAGNOSIS — Z95810 Presence of automatic (implantable) cardiac defibrillator: Secondary | ICD-10-CM

## 2017-09-20 HISTORY — PX: BI-VENTRICULAR IMPLANTABLE CARDIOVERTER DEFIBRILLATOR  (CRT-D): SHX5747

## 2017-09-20 HISTORY — DX: Presence of automatic (implantable) cardiac defibrillator: Z95.810

## 2020-08-21 ENCOUNTER — Emergency Department (HOSPITAL_COMMUNITY): Payer: Medicare Other

## 2020-08-21 ENCOUNTER — Emergency Department (HOSPITAL_COMMUNITY)
Admission: EM | Admit: 2020-08-21 | Discharge: 2020-08-21 | Disposition: A | Discharge status: Home / Self Care | Payer: Medicare Other | Attending: Emergency Medicine | Admitting: Emergency Medicine

## 2020-08-21 ENCOUNTER — Other Ambulatory Visit: Payer: Self-pay

## 2020-08-21 ENCOUNTER — Encounter (HOSPITAL_COMMUNITY): Payer: Self-pay

## 2020-08-21 DIAGNOSIS — Z21 Asymptomatic human immunodeficiency virus [HIV] infection status: Secondary | ICD-10-CM | POA: Insufficient documentation

## 2020-08-21 DIAGNOSIS — R1032 Left lower quadrant pain: Secondary | ICD-10-CM | POA: Diagnosis present

## 2020-08-21 DIAGNOSIS — N201 Calculus of ureter: Secondary | ICD-10-CM | POA: Diagnosis not present

## 2020-08-21 DIAGNOSIS — F17210 Nicotine dependence, cigarettes, uncomplicated: Secondary | ICD-10-CM | POA: Diagnosis not present

## 2020-08-21 DIAGNOSIS — Z950 Presence of cardiac pacemaker: Secondary | ICD-10-CM | POA: Insufficient documentation

## 2020-08-21 DIAGNOSIS — I10 Essential (primary) hypertension: Secondary | ICD-10-CM | POA: Insufficient documentation

## 2020-08-21 DIAGNOSIS — N1339 Other hydronephrosis: Secondary | ICD-10-CM | POA: Insufficient documentation

## 2020-08-21 LAB — CBC WITH DIFFERENTIAL/PLATELET
Abs Immature Granulocytes: 0.02 10*3/uL (ref 0.00–0.07)
Basophils Absolute: 0 10*3/uL (ref 0.0–0.1)
Basophils Relative: 0 %
Eosinophils Absolute: 0 10*3/uL (ref 0.0–0.5)
Eosinophils Relative: 1 %
HCT: 45.4 % (ref 36.0–46.0)
Hemoglobin: 14.7 g/dL (ref 12.0–15.0)
Immature Granulocytes: 0 %
Lymphocytes Relative: 20 %
Lymphs Abs: 1.1 10*3/uL (ref 0.7–4.0)
MCH: 29.3 pg (ref 26.0–34.0)
MCHC: 32.4 g/dL (ref 30.0–36.0)
MCV: 90.6 fL (ref 80.0–100.0)
Monocytes Absolute: 0.5 10*3/uL (ref 0.1–1.0)
Monocytes Relative: 9 %
Neutro Abs: 3.9 10*3/uL (ref 1.7–7.7)
Neutrophils Relative %: 70 %
Platelets: 127 10*3/uL — ABNORMAL LOW (ref 150–400)
RBC: 5.01 MIL/uL (ref 3.87–5.11)
RDW: 13.3 % (ref 11.5–15.5)
WBC: 5.6 10*3/uL (ref 4.0–10.5)
nRBC: 0 % (ref 0.0–0.2)

## 2020-08-21 LAB — COMPREHENSIVE METABOLIC PANEL
ALT: 13 U/L (ref 0–44)
AST: 18 U/L (ref 15–41)
Albumin: 3.8 g/dL (ref 3.5–5.0)
Alkaline Phosphatase: 96 U/L (ref 38–126)
Anion gap: 6 (ref 5–15)
BUN: 18 mg/dL (ref 6–20)
CO2: 24 mmol/L (ref 22–32)
Calcium: 9 mg/dL (ref 8.9–10.3)
Chloride: 107 mmol/L (ref 98–111)
Creatinine, Ser: 1.08 mg/dL — ABNORMAL HIGH (ref 0.44–1.00)
GFR, Estimated: >60 mL/min (ref >60)
Glucose, Bld: 74 mg/dL (ref 70–99)
Potassium: 3.9 mmol/L (ref 3.5–5.1)
Sodium: 137 mmol/L (ref 135–145)
Total Bilirubin: 0.5 mg/dL (ref 0.3–1.2)
Total Protein: 7.3 g/dL (ref 6.5–8.1)

## 2020-08-21 LAB — URINALYSIS, ROUTINE W REFLEX MICROSCOPIC
Bilirubin Urine: NEGATIVE
Glucose, UA: NEGATIVE mg/dL
Hgb urine dipstick: NEGATIVE
Ketones, ur: NEGATIVE mg/dL
Leukocytes,Ua: NEGATIVE
Nitrite: NEGATIVE
Protein, ur: NEGATIVE mg/dL
Specific Gravity, Urine: 1.013 (ref 1.005–1.030)
pH: 5 (ref 5.0–8.0)

## 2020-08-21 MED ORDER — ONDANSETRON 4 MG PO TBDP: 4.0000 mg | ORAL_TABLET | Freq: Three times a day (TID) | ORAL | 0 refills | Status: DC | PRN

## 2020-08-21 MED ORDER — KETOROLAC TROMETHAMINE 60 MG/2ML IM SOLN
60.0000 mg | Freq: Once | INTRAMUSCULAR | Status: AC
  Administered 2020-08-21: 60 mg via INTRAMUSCULAR
  Filled 2020-08-21: qty 2

## 2020-08-21 MED ORDER — HYDROCODONE-ACETAMINOPHEN 5-325 MG PO TABS: 1.0000 | ORAL_TABLET | Freq: Four times a day (QID) | ORAL | 0 refills | Status: DC | PRN

## 2020-08-21 MED ORDER — HYDROCODONE-ACETAMINOPHEN 5-325 MG PO TABS
2.0000 | ORAL_TABLET | Freq: Once | ORAL | Status: AC
  Administered 2020-08-21: 2 via ORAL
  Filled 2020-08-21: qty 2

## 2020-08-21 MED ORDER — NAPROXEN 500 MG PO TABS: 500.0000 mg | ORAL_TABLET | Freq: Two times a day (BID) | ORAL | 0 refills | Status: DC
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
## 2020-08-21 NOTE — Discharge Instructions (Signed)
You may take naproxen as needed for pain, this can be taken twice a day.  Zofran as needed for nausea.  If you have severe pain you may take a hydrocodone 1 tablet every 6 hours.  Please see your doctor within a couple of days if no better, urologist in 1 week if still having pain but come back to the ER for severe or worsening symptoms.

## 2020-08-21 NOTE — ED Triage Notes (Signed)
Pt reports left flank pain and pain in llq since approx 9am.  Reports history of kidney stones.  Denies any pain or burning with urination but reports urinary frequency today.  Denies any abnormal vaginal bleeding or discharge.  Reports nausea, no diarrhea.

## 2021-11-14 ENCOUNTER — Encounter (HOSPITAL_COMMUNITY): Payer: Self-pay

## 2021-11-14 ENCOUNTER — Emergency Department (HOSPITAL_COMMUNITY)
Admission: EM | Admit: 2021-11-14 | Discharge: 2021-11-14 | Disposition: A | Discharge status: Home / Self Care | Payer: Medicare HMO | Attending: Emergency Medicine | Admitting: Emergency Medicine

## 2021-11-14 ENCOUNTER — Other Ambulatory Visit: Payer: Self-pay

## 2021-11-14 ENCOUNTER — Emergency Department (HOSPITAL_COMMUNITY): Payer: Medicare HMO

## 2021-11-14 DIAGNOSIS — J441 Chronic obstructive pulmonary disease with (acute) exacerbation: Secondary | ICD-10-CM | POA: Diagnosis not present

## 2021-11-14 DIAGNOSIS — Z20822 Contact with and (suspected) exposure to covid-19: Secondary | ICD-10-CM | POA: Insufficient documentation

## 2021-11-14 DIAGNOSIS — Z23 Encounter for immunization: Secondary | ICD-10-CM | POA: Insufficient documentation

## 2021-11-14 DIAGNOSIS — R0602 Shortness of breath: Secondary | ICD-10-CM | POA: Diagnosis present

## 2021-11-14 HISTORY — DX: Acute myocardial infarction, unspecified: I21.9

## 2021-11-14 LAB — BLOOD GAS, VENOUS
Acid-Base Excess: 5.6 mmol/L — ABNORMAL HIGH (ref 0.0–2.0)
Bicarbonate: 33.4 mmol/L — ABNORMAL HIGH (ref 20.0–28.0)
Drawn by: 6939
FIO2: 21 %
O2 Saturation: 36.5 %
Patient temperature: 36.5
pCO2, Ven: 61 mmHg — ABNORMAL HIGH (ref 44–60)
pH, Ven: 7.35 (ref 7.25–7.43)
pO2, Ven: <31 mmHg — CL (ref 32–45)

## 2021-11-14 LAB — CBC WITH DIFFERENTIAL/PLATELET
Abs Immature Granulocytes: 0.01 10*3/uL (ref 0.00–0.07)
Basophils Absolute: 0 10*3/uL (ref 0.0–0.1)
Basophils Relative: 1 %
Eosinophils Absolute: 0.1 10*3/uL (ref 0.0–0.5)
Eosinophils Relative: 2 %
HCT: 47.8 % — ABNORMAL HIGH (ref 36.0–46.0)
Hemoglobin: 15.1 g/dL — ABNORMAL HIGH (ref 12.0–15.0)
Immature Granulocytes: 0 %
Lymphocytes Relative: 32 %
Lymphs Abs: 1.6 10*3/uL (ref 0.7–4.0)
MCH: 29.5 pg (ref 26.0–34.0)
MCHC: 31.6 g/dL (ref 30.0–36.0)
MCV: 93.5 fL (ref 80.0–100.0)
Monocytes Absolute: 0.5 10*3/uL (ref 0.1–1.0)
Monocytes Relative: 10 %
Neutro Abs: 2.7 10*3/uL (ref 1.7–7.7)
Neutrophils Relative %: 55 %
Platelets: 126 10*3/uL — ABNORMAL LOW (ref 150–400)
RBC: 5.11 MIL/uL (ref 3.87–5.11)
RDW: 13 % (ref 11.5–15.5)
WBC: 4.9 10*3/uL (ref 4.0–10.5)
nRBC: 0 % (ref 0.0–0.2)

## 2021-11-14 LAB — RESP PANEL BY RT-PCR (FLU A&B, COVID) ARPGX2
Influenza A by PCR: NEGATIVE
Influenza B by PCR: NEGATIVE
SARS Coronavirus 2 by RT PCR: NEGATIVE

## 2021-11-14 LAB — COMPREHENSIVE METABOLIC PANEL
ALT: 10 U/L (ref 0–44)
AST: 20 U/L (ref 15–41)
Albumin: 3.6 g/dL (ref 3.5–5.0)
Alkaline Phosphatase: 122 U/L (ref 38–126)
Anion gap: 6 (ref 5–15)
BUN: 22 mg/dL — ABNORMAL HIGH (ref 6–20)
CO2: 29 mmol/L (ref 22–32)
Calcium: 9 mg/dL (ref 8.9–10.3)
Chloride: 105 mmol/L (ref 98–111)
Creatinine, Ser: 1.87 mg/dL — ABNORMAL HIGH (ref 0.44–1.00)
GFR, Estimated: 31 mL/min — ABNORMAL LOW (ref >60)
Glucose, Bld: 75 mg/dL (ref 70–99)
Potassium: 4.5 mmol/L (ref 3.5–5.1)
Sodium: 140 mmol/L (ref 135–145)
Total Bilirubin: 0.5 mg/dL (ref 0.3–1.2)
Total Protein: 6.5 g/dL (ref 6.5–8.1)

## 2021-11-14 LAB — BRAIN NATRIURETIC PEPTIDE: B Natriuretic Peptide: 16 pg/mL (ref 0.0–100.0)

## 2021-11-14 MED ORDER — ALBUTEROL (5 MG/ML) CONTINUOUS INHALATION SOLN: 10.0000 mg | INHALATION_SOLUTION | RESPIRATORY_TRACT | Status: AC

## 2021-11-14 MED ORDER — IPRATROPIUM-ALBUTEROL 0.5-2.5 (3) MG/3ML IN SOLN
3.0000 mL | Freq: Once | RESPIRATORY_TRACT | Status: AC
  Administered 2021-11-14: 3 mL via RESPIRATORY_TRACT
  Filled 2021-11-14: qty 3

## 2021-11-14 MED ORDER — METHYLPREDNISOLONE SODIUM SUCC 125 MG IJ SOLR
125.0000 mg | Freq: Once | INTRAMUSCULAR | Status: AC
  Administered 2021-11-14: 125 mg via INTRAVENOUS
  Filled 2021-11-14: qty 2

## 2021-11-14 MED ORDER — ALBUTEROL SULFATE HFA 108 (90 BASE) MCG/ACT IN AERS: 2.0000 | INHALATION_SPRAY | RESPIRATORY_TRACT | 3 refills | Status: AC | PRN

## 2021-11-14 MED ORDER — PREDNISONE 20 MG PO TABS: 40.0000 mg | ORAL_TABLET | Freq: Every day | ORAL | 0 refills | Status: DC

## 2021-11-14 NOTE — ED Notes (Signed)
Notified MD of pCO2 < 31 ? ? ?

## 2021-11-14 NOTE — Discharge Instructions (Signed)
If symptoms worsen I do want you to come back immediately to the emergency department.  At this time it appears that you do not have pneumonia but I would like for you to use the following medications ? ?Prednisone daily for 5 days ? ?Albuterol inhaler 2 puffs every 4 hours as needed for shortness of breath. ?

## 2021-11-14 NOTE — ED Notes (Signed)
O2 91-94% on room air with ambulation. Pt tolerated well with no SOB or coughing. ?

## 2021-11-14 NOTE — ED Notes (Signed)
2L O2 via [place] applied for room air sat 88% ?

## 2021-11-14 NOTE — ED Provider Notes (Signed)
?[facility] ?Provider Note ? ? ?CSN: [id number] ?Arrival date & time: 11/14/21  0930 ? ?  ? ?History ? ?Chief Complaint  ?Patient presents with  ? Shortness of Breath  ? ? ?Claire Day is a 56 y.o. female. ? ? ?Shortness of Breath ? ?This patient is a 56 year old female with a known history of HIV, she is currently taking her antivirals and is followed by an infectious disease doctor in [place] however she reports that her last viral load was undetectable, she is unaware of what her last CD4 count was.  She also has a history of needing an albuterol inhaler though she does not know why she has to take this as she has never formally been diagnosed with reactive airway disease to the best of her knowledge.  She has smoked 1 pack a day for at least 30 years and continues to smoke, she has not used her inhaler in the last couple of days. ? ?The patient reports that she has had some increasing shortness of breath over the last week as she has had what appears to be a cold with what she describes as a runny nose and occasional cough.  She is short of breath mostly in the mornings but today was more short of breath than usual for which reason she called paramedics.  They noted her oxygen level to be around 89% on room air and placed her on 4 L by nasal cannula.  The patient does not use oxygen at home.  No medications were given prehospital ? ?The patient has a cough productive of some clear sputum, no fevers or chills, no swelling of the legs, no chest pain or abdominal pain, no diarrhea ? ?Home Medications ?Prior to Admission medications   ?Medication Sig Start Date End Date Taking? Authorizing Provider  ?albuterol (VENTOLIN HFA) 108 (90 Base) MCG/ACT inhaler Inhale 2 puffs into the lungs every 4 (four) hours as needed for wheezing or shortness of breath. 11/14/21  Yes Eber Hong, MD  ?predniSONE (DELTASONE) 20 MG tablet Take 2 tablets (40 mg total) by mouth daily. 11/14/21  Yes Eber Hong,  MD  ?atazanavir (REYATAZ) 150 MG capsule Take 300 mg by mouth 2 (two) times daily.    [provider]  ?HYDROcodone-acetaminophen (NORCO/VICODIN) 5-325 MG tablet Take 1 tablet by mouth every 6 (six) hours as needed for moderate pain. 08/21/20   Eber Hong, MD  ?naproxen (NAPROSYN) 500 MG tablet Take 1 tablet (500 mg total) by mouth 2 (two) times daily with a meal. 08/21/20   Eber Hong, MD  ?ondansetron (ZOFRAN ODT) 4 MG disintegrating tablet Take 1 tablet (4 mg total) by mouth every 8 (eight) hours as needed for nausea. 08/21/20   Eber Hong, MD  ?Raltegravir Potassium (ISENTRESS PO) Take 1 tablet by mouth 2 (two) times daily.    [provider]  ?ritonavir (NORVIR) 100 MG TABS Take 100 mg by mouth daily.    [provider]  ?   ? ?Allergies    ?Sulfamethoxazole-trimethoprim and Tramadol   ? ?Review of Systems   ?Review of Systems  ?Respiratory:  Positive for shortness of breath.   ?All other systems reviewed and are negative. ? ?Physical Exam ?Updated Vital Signs ?BP (!) 154/87   Pulse 66   Temp 97.7 ?F (36.5 ?C) (Oral)   Resp 19   Ht 1.702 m (5\' 7" )   Wt 45.4 kg   SpO2 97%   BMI 15.66 kg/m?  ?Physical Exam ?Vitals  and nursing note reviewed.  ?Constitutional:   ?   General: She is not in acute distress. ?   Appearance: She is well-developed.  ?HENT:  ?   Head: Normocephalic and atraumatic.  ?   Mouth/Throat:  ?   Pharynx: No oropharyngeal exudate.  ?Eyes:  ?   General: No scleral icterus.    ?   Right eye: No discharge.     ?   Left eye: No discharge.  ?   Conjunctiva/sclera: Conjunctivae normal.  ?   Pupils: Pupils are equal, round, and reactive to light.  ?Neck:  ?   Thyroid: No thyromegaly.  ?   Vascular: No JVD.  ?Cardiovascular:  ?   Rate and Rhythm: Normal rate and regular rhythm.  ?   Heart sounds: Normal heart sounds. No murmur heard. ?  No friction rub. No gallop.  ?Pulmonary:  ?   Effort: No respiratory distress.  ?   Breath sounds: Wheezing present. No rales.  ?    Comments: The patient has significant decreased breath sounds bilaterally, she has diffuse expiratory wheezing on forced expiration, she is able to speak in full sentences but is mildly tachypneic and has a prolonged expiratory phase ?Abdominal:  ?   General: Bowel sounds are normal. There is no distension.  ?   Palpations: Abdomen is soft. There is no mass.  ?   Tenderness: There is no abdominal tenderness.  ?Musculoskeletal:     ?   General: No tenderness. Normal range of motion.  ?   Cervical back: Normal range of motion and neck supple.  ?   Right lower leg: No edema.  ?   Left lower leg: No edema.  ?Lymphadenopathy:  ?   Cervical: No cervical adenopathy.  ?Skin: ?   General: Skin is warm and dry.  ?   Findings: No erythema or rash.  ?Neurological:  ?   Mental Status: She is alert.  ?   Coordination: Coordination normal.  ?Psychiatric:     ?   Behavior: Behavior normal.  ? ? ?ED Results / Procedures / Treatments   ?Labs ?(all labs ordered are listed, but only abnormal results are displayed) ?Labs Reviewed  ?CBC WITH DIFFERENTIAL/PLATELET - Abnormal; Notable for the following components:  ?    Result Value  ? Hemoglobin 15.1 (*)   ? HCT 47.8 (*)   ? Platelets 126 (*)   ? All other components within normal limits  ?COMPREHENSIVE METABOLIC PANEL - Abnormal; Notable for the following components:  ? BUN 22 (*)   ? Creatinine, Ser 1.87 (*)   ? GFR, Estimated 31 (*)   ? All other components within normal limits  ?BLOOD GAS, VENOUS - Abnormal; Notable for the following components:  ? pCO2, Ven 61 (*)   ? pO2, Ven <31 (*)   ? Bicarbonate 33.4 (*)   ? Acid-Base Excess 5.6 (*)   ? All other components within normal limits  ?RESP PANEL BY RT-PCR (FLU A&B, COVID) [id number]  ?BRAIN NATRIURETIC PEPTIDE  ?POC URINE PREG, ED  ? ? ?EKG ?EKG Interpretation ? ?Date/Time:  Saturday November 14 2021 09:33:56 EDT ?Ventricular Rate:  84 ?PR Interval:  165 ?QRS Duration: 122 ?QT Interval:  410 ?QTC Calculation: 485 ?R Axis:   259 ?Text  Interpretation: Sinus rhythm IVCD, consider atypical RBBB Anterior infarct, old Abnormal T, consider ischemia, diffuse leads Baseline wander in lead(s) III V5 Since last tracing QRS has widened and rate has slowed Confirmed by Eber Hong ([id number])  on 11/14/2021 9:53:57 AM ? ?Radiology ?DG Chest Port 1 View ? ?Result Date: 11/14/2021 ?CLINICAL DATA:  56 year old female with history of nonproductive cough and shortness of breath for over 1 week. EXAM: PORTABLE CHEST 1 VIEW COMPARISON:  Chest x-ray 03/02/2021. FINDINGS: Lung volumes are increased with emphysematous changes throughout the lungs bilaterally. No consolidative airspace disease. Chronic blunting of the left costophrenic sulcus, unchanged, likely secondary to chronic pleuroparenchymal scarring. No definite pleural effusions. No pneumothorax. No pulmonary nodule or mass noted. Pulmonary vasculature and the cardiomediastinal silhouette are within normal limits. Atherosclerosis in the thoracic aorta. Left-sided biventricular pacemaker/AICD with lead tips projecting over the expected location of the right atrium, right ventricle and overlying the lateral wall the left ventricle via the coronary sinus and coronary veins. IMPRESSION: 1. No radiographic evidence of acute cardiopulmonary disease. 2. Emphysema. Electronically Signed   By: Trudie Reedaniel  Entrikin M.D.   On: 11/14/2021 10:30   ? ?Procedures ?Procedures  ? ? ?Medications Ordered in ED ?Medications  ?albuterol (PROVENTIL,VENTOLIN) solution continuous neb (10 mg Nebulization Not Given 11/14/21 1005)  ?methylPREDNISolone sodium succinate (SOLU-MEDROL) 125 mg/2 mL injection 125 mg (125 mg Intravenous Given 11/14/21 1015)  ?ipratropium-albuterol (DUONEB) 0.5-2.5 (3) MG/3ML nebulizer solution 3 mL (3 mLs Nebulization Given 11/14/21 0953)  ? ? ?ED Course/ Medical Decision Making/ A&P ?  ?                        ?Medical Decision Making ?Amount and/or Complexity of Data Reviewed ?Labs: ordered. ?Radiology:  ordered. ? ?Risk ?Prescription drug management. ? ? ?This patient appears to have COPD exacerbation or possible pneumonia, with her decreased lung sounds prolonged expiratory phase and relative hypoxia she will need to have an

## 2021-11-14 NOTE — ED Triage Notes (Signed)
Patient presents to ED via [facility], alert and orinted x 3. pt. c/o shortness of breath and non-productive cough x 7 days. No fever  Patient stated Hx of MI  HTN an HIV. Denies chest pain but states that her breathing is progressively getting worse . ?

## 2023-02-12 DIAGNOSIS — I671 Cerebral aneurysm, nonruptured: Secondary | ICD-10-CM

## 2023-02-12 DIAGNOSIS — G459 Transient cerebral ischemic attack, unspecified: Secondary | ICD-10-CM

## 2023-02-12 DIAGNOSIS — R911 Solitary pulmonary nodule: Secondary | ICD-10-CM

## 2023-02-12 HISTORY — DX: Transient cerebral ischemic attack, unspecified: G45.9

## 2023-02-12 HISTORY — DX: Solitary pulmonary nodule: R91.1

## 2023-02-12 HISTORY — DX: Cerebral aneurysm, nonruptured: I67.1

## 2023-06-08 ENCOUNTER — Other Ambulatory Visit: Payer: Self-pay

## 2023-06-08 ENCOUNTER — Encounter: Payer: Self-pay | Admitting: Emergency Medicine

## 2023-06-08 ENCOUNTER — Emergency Department: Payer: Medicare HMO

## 2023-06-08 ENCOUNTER — Emergency Department
Admission: EM | Admit: 2023-06-08 | Discharge: 2023-06-08 | Disposition: A | Discharge status: Home / Self Care | Payer: Medicare HMO | Attending: Emergency Medicine | Admitting: Emergency Medicine

## 2023-06-08 DIAGNOSIS — N200 Calculus of kidney: Secondary | ICD-10-CM | POA: Insufficient documentation

## 2023-06-08 DIAGNOSIS — R109 Unspecified abdominal pain: Secondary | ICD-10-CM | POA: Diagnosis present

## 2023-06-08 DIAGNOSIS — N390 Urinary tract infection, site not specified: Secondary | ICD-10-CM | POA: Diagnosis not present

## 2023-06-08 DIAGNOSIS — Z21 Asymptomatic human immunodeficiency virus [HIV] infection status: Secondary | ICD-10-CM | POA: Insufficient documentation

## 2023-06-08 LAB — CBC
HCT: 44.9 % (ref 36.0–46.0)
Hemoglobin: 14.4 g/dL (ref 12.0–15.0)
MCH: 31.7 pg (ref 26.0–34.0)
MCHC: 32.1 g/dL (ref 30.0–36.0)
MCV: 98.9 fL (ref 80.0–100.0)
Platelets: 122 10*3/uL — ABNORMAL LOW (ref 150–400)
RBC: 4.54 MIL/uL (ref 3.87–5.11)
RDW: 12.3 % (ref 11.5–15.5)
WBC: 5.2 10*3/uL (ref 4.0–10.5)
nRBC: 0 % (ref 0.0–0.2)

## 2023-06-08 LAB — BASIC METABOLIC PANEL
Anion gap: 10 (ref 5–15)
BUN: 26 mg/dL — ABNORMAL HIGH (ref 6–20)
CO2: 25 mmol/L (ref 22–32)
Calcium: 8.8 mg/dL — ABNORMAL LOW (ref 8.9–10.3)
Chloride: 105 mmol/L (ref 98–111)
Creatinine, Ser: 1.59 mg/dL — ABNORMAL HIGH (ref 0.44–1.00)
GFR, Estimated: 38 mL/min — ABNORMAL LOW (ref >60)
Glucose, Bld: 95 mg/dL (ref 70–99)
Potassium: 4.2 mmol/L (ref 3.5–5.1)
Sodium: 140 mmol/L (ref 135–145)

## 2023-06-08 LAB — URINALYSIS, ROUTINE W REFLEX MICROSCOPIC
Bilirubin Urine: NEGATIVE
Glucose, UA: NEGATIVE mg/dL
Ketones, ur: NEGATIVE mg/dL
Nitrite: NEGATIVE
Protein, ur: 100 mg/dL — AB
RBC / HPF: >50 RBC/hpf (ref 0–5)
Specific Gravity, Urine: 1.015 (ref 1.005–1.030)
WBC, UA: >50 WBC/hpf (ref 0–5)
pH: 7 (ref 5.0–8.0)

## 2023-06-08 MED ORDER — HYDROCODONE-ACETAMINOPHEN 5-325 MG PO TABS
1.0000 | ORAL_TABLET | Freq: Once | ORAL | Status: AC
  Administered 2023-06-08: 1 via ORAL
  Filled 2023-06-08: qty 1

## 2023-06-08 MED ORDER — HYDROCODONE-ACETAMINOPHEN 5-325 MG PO TABS: 1.0000 | ORAL_TABLET | Freq: Four times a day (QID) | ORAL | 0 refills | Status: DC | PRN

## 2023-06-08 MED ORDER — CEFDINIR 300 MG PO CAPS: 300.0000 mg | ORAL_CAPSULE | Freq: Two times a day (BID) | ORAL | 0 refills | Status: DC

## 2023-06-08 MED ORDER — CEFDINIR 300 MG PO CAPS
300.0000 mg | ORAL_CAPSULE | ORAL | Status: AC
  Administered 2023-06-08: 300 mg via ORAL
  Filled 2023-06-08: qty 1

## 2023-06-08 NOTE — ED Notes (Signed)
ED Provider at bedside. 

## 2023-06-08 NOTE — ED Notes (Signed)
Rad at bedside.

## 2023-06-08 NOTE — ED Provider Notes (Signed)
[facility] Provider Note    Event Date/Time   First MD Initiated Contact with Patient 06/08/23 1147     (approximate)   History   Flank Pain and Abdominal Pain   HPI  Claire Day is a 57 y.o. female history of kidney stones, HIV currently on treatment  Patient reports that for at least a month now she has been suffering with left-sided flank pain.  She reports she had a significant left flank pain that is been ongoing.  Diagnosed previously as kidney stones twice at [facility].  She is currently on Keflex and taking Toradol.  She reports pain is continuing and fairly severe in the left flank.  No fevers no chills.  No chest pain.  Some discomfort with urination.  No chills or bodyaches  She advises she was supposed to have appointment with urologist, but has not been able to get up to appointment in [facility]     Physical Exam   Triage Vital Signs: ED Triage Vitals  Encounter Vitals Group     BP 06/08/23 1047 (!) 152/93     Systolic BP Percentile --      Diastolic BP Percentile --      Pulse Rate 06/08/23 1047 100     Resp 06/08/23 1047 18     Temp 06/08/23 1047 98.5 F (36.9 C)     Temp src --      SpO2 06/08/23 1047 95 %     Weight 06/08/23 1045 103 lb (46.7 kg)     Height 06/08/23 1045 5\' 8"  (1.727 m)     Head Circumference --      Peak Flow --      Pain Score 06/08/23 1045 10     Pain Loc --      Pain Education --      Exclude from Growth Chart --     Most recent vital signs: Vitals:   06/08/23 1047  BP: (!) 152/93  Pulse: 100  Resp: 18  Temp: 98.5 F (36.9 C)  SpO2: 95%     General: Awake, no distress.  Appears in moderate discomfort, sitting uncomfortably in the bed. CV:  Good peripheral perfusion.  Normal tones and rate Resp:  Normal effort.  Clear bilateral Abd:  No distention.  Soft nontender nondistended throughout.  She has moderate tenderness to percussion of the left costovertebral angle, none noted on  the right Other:  Warm well-perfused.  Moist mucous membranes fully alert and oriented  She did not drive herself here.  She advises that she has previously used pain medication like hydrocodone in the past without difficulty   ED Results / Procedures / Treatments   Labs (all labs ordered are listed, but only abnormal results are displayed) Labs Reviewed  URINALYSIS, ROUTINE W REFLEX MICROSCOPIC - Abnormal; Notable for the following components:      Result Value   Color, Urine YELLOW (*)    APPearance HAZY (*)    Hgb urine dipstick LARGE (*)    Protein, ur 100 (*)    Leukocytes,Ua MODERATE (*)    Bacteria, UA RARE (*)    All other components within normal limits  BASIC METABOLIC PANEL - Abnormal; Notable for the following components:   BUN 26 (*)    Creatinine, Ser 1.59 (*)    Calcium 8.8 (*)    GFR, Estimated 38 (*)    All other components within normal limits  CBC - Abnormal; Notable for the  following components:   Platelets 122 (*)    All other components within normal limits  URINE CULTURE     EKG     RADIOLOGY  No results found.  DG Abdomen 1 View  Result Date: 06/08/2023 CLINICAL DATA:  Left lower quadrant pain.  History of kidney stones. EXAM: ABDOMEN - 1 VIEW COMPARISON:  CT abdomen/pelvis dated August 21, 2020. FINDINGS: The bowel gas pattern is normal. No evidence of obstruction. Surgical clips in the right upper quadrant. 8 mm calcification projecting over the midpole of the right kidney as well as additional calcifications measuring up to 8 mm projecting over the midpole of the left kidney are favored to reflect renal calculi. IMPRESSION: Findings favored to reflect bilateral nephrolithiasis. Electronically Signed   By: Hart Robinsons M.D.   On: 06/08/2023 15:45     PROCEDURES:  Critical Care performed: No  Procedures   MEDICATIONS ORDERED IN ED: Medications  cefdinir (OMNICEF) capsule 300 mg (has no administration in time range)   HYDROcodone-acetaminophen (NORCO/VICODIN) 5-325 MG per tablet 1 tablet (1 tablet Oral Given 06/08/23 1219)     IMPRESSION / MDM / ASSESSMENT AND PLAN / ED COURSE  I reviewed the triage vital signs and the nursing notes.                              Differential diagnosis includes, but is not limited to, nephrolithiasis, septic stone, pyelonephritis, urinary tract infection, hydronephrosis, etc.  Her symptoms have been ongoing since.  Recent CT scan at [facility].  I called and spoke with nurse practitioner Domingo Cocking who advised she actually saw the patient and 2 days ago the patient had a CT scan demonstrating a 6 mm proximal left ureteral stone.  They also had concerns for a possible urinary tract infection and she was started on Keflex at that time.  Her creatinine at that time was 1.6  Patient appears uncomfortable, she does not have a fever her white count is normal.  Urinalysis concerning for infection in the setting of a known left-sided kidney stone, in discussion with Octavio Manns it appears this kidney stone may be a somewhat subacute to ongoing issue though.  Sort of hard to be certain.  She does not appear acutely ill or septic  ----------------------------------------- 12:55 PM on 06/08/2023 ----------------------------------------- Patient is resting more comfortably she is awake alert in no distress.  I have sent a message to urology to provide additional recommendations regarding the pain from suspected kidney stone as well as urinalysis that appears to demonstrate some infection  Patient's presentation is most consistent with acute complicated illness / injury requiring diagnostic workup.  Consulted with Dr. Lonna Cobb, he has reviewed the patient's chart and labs, advises to change antibiotic to cefdinir and urology to schedule appointment for patient this week.  He advises without fever or elevated white count that he would not recommend operative intervention at this time.  Given the  patient's pain is well-controlled, I think she is appropriate to follow-up closely this week with change in antibiotic which I have discussed with her.  Very careful return precautions advised  Evidently there has been some element of chronicity to this as well having discussed with [facility].  I do suspect she has urinary tract infection, however she does not demonstrate evidence of sepsis.  She does have slight tachycardia to borderline tachycardia but is at time of her presentation when she was in pain.  She is now  resting comfortably without distress.  She is very comfortable with calling the urology clinic and knows to schedule an appointment for this week here  She will not drive today or while taking hydrocodone.  Understands careful return precautions        FINAL CLINICAL IMPRESSION(S) / ED DIAGNOSES   Final diagnoses:  Kidney stone on left side  Urinary tract infection, acute     Rx / DC Orders   ED Discharge Orders          Ordered    Ambulatory referral to Urology       Comments: L sided flank pain   06/08/23 1216    Ambulatory referral to Urology       Comments: Appointment this week per Dr. Lonna Cobb   06/08/23 1302    HYDROcodone-acetaminophen (NORCO/VICODIN) 5-325 MG tablet  Every 6 hours PRN        06/08/23 1317    cefdinir (OMNICEF) 300 MG capsule  2 times daily        06/08/23 1317             Note:  This document was prepared using Dragon voice recognition software and may include unintentional dictation errors.   Sharyn Creamer, MD 06/08/23 1655

## 2023-06-08 NOTE — ED Notes (Signed)
Pt. Returned to room from bathroom, gait slow and steady, NAD. Pt. Has urine sample obtained.

## 2023-06-08 NOTE — ED Triage Notes (Signed)
Pt via POV from home. Pt c/o LLQ pain states she was seen twice at the ED in [facility] for same, once a month ago and last night, pt has multiple kidney stones. States that Urology is unable to see her till December. Pt is A&OX4 and NAD

## 2023-06-08 NOTE — Discharge Instructions (Addendum)
Please call our urology clinic today to schedule an appointment for this week.  It is extremely important you call and get scheduled.  Return to the ER right away if you have fever, chills, worsening pain, severe pain, difficulty breathing, vomiting or other concerns arise.  No driving today or within 8 hours of use of hydrocodone.  Please discontinue your antibiotic cephalexin, and changed to the new antibiotic cefdinir which has been prescribed

## 2023-06-09 ENCOUNTER — Other Ambulatory Visit: Payer: Self-pay | Admitting: Urology

## 2023-06-09 ENCOUNTER — Encounter: Payer: Self-pay | Admitting: Urology

## 2023-06-09 ENCOUNTER — Ambulatory Visit: Payer: Self-pay | Admitting: Urology

## 2023-06-09 ENCOUNTER — Telehealth: Payer: Self-pay

## 2023-06-09 ENCOUNTER — Other Ambulatory Visit: Payer: Self-pay

## 2023-06-09 VITALS — BP 125/79 | HR 108 | Ht 68.0 in | Wt 103.0 lb

## 2023-06-09 DIAGNOSIS — N23 Unspecified renal colic: Secondary | ICD-10-CM | POA: Diagnosis not present

## 2023-06-09 DIAGNOSIS — N201 Calculus of ureter: Secondary | ICD-10-CM

## 2023-06-09 DIAGNOSIS — N1330 Unspecified hydronephrosis: Secondary | ICD-10-CM

## 2023-06-09 DIAGNOSIS — N132 Hydronephrosis with renal and ureteral calculous obstruction: Secondary | ICD-10-CM

## 2023-06-09 DIAGNOSIS — N200 Calculus of kidney: Secondary | ICD-10-CM

## 2023-06-09 LAB — URINALYSIS, COMPLETE
Bilirubin, UA: NEGATIVE
Glucose, UA: NEGATIVE
Ketones, UA: NEGATIVE
Nitrite, UA: NEGATIVE
Specific Gravity, UA: 1.02 (ref 1.005–1.030)
Urobilinogen, Ur: 1 mg/dL (ref 0.2–1.0)
pH, UA: 7.5 (ref 5.0–7.5)

## 2023-06-09 LAB — MICROSCOPIC EXAMINATION
RBC, Urine: >30 /[HPF] — AB (ref 0–2)
WBC, UA: >30 /[HPF] — AB (ref 0–5)

## 2023-06-09 NOTE — Progress Notes (Signed)
I,Amy L Pierron,acting as a scribe for [name], MD.,have documented all relevant documentation on the behalf of [name], MD,as directed by  [name], MD while in the presence of [name], MD.  06/09/2023 11:03 AM   Babs Sciara 13-Oct-1965 [id number]  Referring provider: Aggie Cosier, MD [facility] [address] [place],  [place] [address]  Chief Complaint  Patient presents with   Nephrolithiasis    HPI: Claire Day is a 57 y.o. female presents in follow-up of a recent ED visit for renal colic.  Has been seen in the ED in [place], [place] on two occasions in the last 2 months. She was most recently seen earlier this week and had a CT showing a 6mm left proximal ureteral calculus with hydronephrosis. She has a prior history of stone disease with previous uretroscopic stone removal and SWL, however, cannot get in to see the urologist in [place] for at least 1 month. She [facility]. She had been started on Keflex for a possible UTI. She complains of left flank pain radiating to the left lower quadrant, which is severe. She's had nausea and vomiting, but no fever or chills. Urinalysis in the ED yesterday showed >50 RBCs/ > 50 WBCs, with 0-5 epithelial cells. Urine culture was ordered and she was started on Cefuroxime. KUB performed in ED yesterday shows large amount of stool and bowel gas. There are calcifications overlying the left renal outline. Do not see a definite proximal ureteral calculus.   PMH: Past Medical History:  Diagnosis Date   HIV (human immunodeficiency virus infection) (HCC)    Hypertension    MI (myocardial infarction) (HCC)     Surgical History: Past Surgical History:  Procedure Laterality Date   CHOLECYSTECTOMY     PACEMAKER IMPLANT      Home Medications:  Allergies as of 06/09/2023       Reactions   Sulfamethoxazole-trimethoprim Hives   Tramadol         Medication List         Accurate as of June 09, 2023 11:03 AM. If you have any questions, ask your nurse or doctor.          albuterol 108 (90 Base) MCG/ACT inhaler Commonly known as: VENTOLIN HFA Inhale 2 puffs into the lungs every 4 (four) hours as needed for wheezing or shortness of breath.   atazanavir 150 MG capsule Commonly known as: REYATAZ Take 300 mg by mouth 2 (two) times daily.   cefdinir 300 MG capsule Commonly known as: OMNICEF Take 1 capsule (300 mg total) by mouth 2 (two) times daily.   HYDROcodone-acetaminophen 5-325 MG tablet Commonly known as: NORCO/VICODIN Take 1 tablet by mouth every 6 (six) hours as needed for moderate pain (pain score 4-6).   ISENTRESS PO Take 1 tablet by mouth 2 (two) times daily.   naproxen 500 MG tablet Commonly known as: Naprosyn Take 1 tablet (500 mg total) by mouth 2 (two) times daily with a meal.   ondansetron 4 MG disintegrating tablet Commonly known as: Zofran ODT Take 1 tablet (4 mg total) by mouth every 8 (eight) hours as needed for nausea.   predniSONE 20 MG tablet Commonly known as: DELTASONE Take 2 tablets (40 mg total) by mouth daily.   ritonavir 100 MG Tabs tablet Commonly known as: NORVIR Take 100 mg by mouth daily.        Allergies:  Allergies  Allergen Reactions   Sulfamethoxazole-Trimethoprim  Hives   Tramadol     Family History: No family history on file.  Social History:  reports that she has been smoking cigarettes. She does not have any smokeless tobacco history on file. She reports that she does not drink alcohol and does not use drugs.   Physical Exam: BP 125/79   Pulse (!) 108   Ht 5\' 8"  (1.727 m)   Wt 103 lb (46.7 kg)   BMI 15.66 kg/m   Constitutional:  Alert and oriented, No mild distress secondary to stone pain. HEENT: [place] AT Respiratory: Normal respiratory effort, no increased work of breathing. Psychiatric: Normal mood and affect.  Laboratory Data: Lab Results  Component Value Date   WBC  5.2 06/08/2023   HGB 14.4 06/08/2023   HCT 44.9 06/08/2023   MCV 98.9 06/08/2023   PLT 122 (L) 06/08/2023    Lab Results  Component Value Date   CREATININE 1.59 (H) 06/08/2023    Urinalysis    Component Value Date/Time   COLORURINE YELLOW (A) 06/08/2023 1220   APPEARANCEUR HAZY (A) 06/08/2023 1220   LABSPEC 1.015 06/08/2023 1220   PHURINE 7.0 06/08/2023 1220   GLUCOSEU NEGATIVE 06/08/2023 1220   GLUCOSEU NEG mg/dL 01/60/1093 2355   HGBUR LARGE (A) 06/08/2023 1220   BILIRUBINUR NEGATIVE 06/08/2023 1220   KETONESUR NEGATIVE 06/08/2023 1220   PROTEINUR 100 (A) 06/08/2023 1220   UROBILINOGEN 0.2 04/22/2014 2209   NITRITE NEGATIVE 06/08/2023 1220   LEUKOCYTESUR MODERATE (A) 06/08/2023 1220    Lab Results  Component Value Date   BACTERIA RARE (A) 06/08/2023    Assessment & Plan:    1.  Left proximal ureteral calculus Significant pain which has been difficult for her to find relief.  Urine culture is pending and if negative I offered her left ureteroscopy with treatment of her ureteral and renal calculi early next week. Since she has severe pain, she was also given the option of stent placement as a temporary measure relieve her obstruction and pain with subsequent definitive stone treatment in 2-3 weeks. She has elected to proceed with stent placement tomorrow and Dr. Richardo Hanks will add to his schedule. We discussed potential complications, including bleeding, infection/ sepsis. The low likelihood of inability to place the stent secondary to stone impaction with need for percutaneous nephrostomy was discussed. All questions were answered.   I have reviewed the above documentation for accuracy and completeness, and I agree with the above.   [name], MD  [facility] [address], [address] [place], [place] [address] [phone]

## 2023-06-09 NOTE — Progress Notes (Signed)
Surgical Physician Order Form [facility] [place]  Dr. Legrand Rams, MD  * Scheduling expectation : 06/10/2023  *Length of Case: 30 minutes  *Clearance needed: no  *Anticoagulation Instructions: N/A  *Aspirin Instructions: N/A  *Post-op visit Date/Instructions: Will need to schedule definitive stone treatment 2-3 weeks  *Diagnosis: Left Ureteral Stone  *Procedure: Placement left ureteral stent   Additional orders: N/A  -Admit type: OUTpatient  -Anesthesia: Choice  -VTE Prophylaxis Standing Order SCD's       Other:   -Standing Lab Orders Per Anesthesia    Lab other: None  -Standing Test orders EKG/Chest x-ray per Anesthesia       Test other:   - Medications:  Ceftriaxone(Rocephin) 1gm IV  -Other orders:  N/A

## 2023-06-09 NOTE — Progress Notes (Signed)
   [facility] Posting Form  Surgery Date: Date: 06/10/2023  Surgeon: Dr. Legrand Rams, MD  Inpt ( No  )   Outpt (Yes)   Obs ( No  )   Diagnosis: N20.1 Left Ureteral Stone  -CPT: [phone]  Surgery: Left Ureteral Stent Placement  Stop Anticoagulations: No  Cardiac/Medical/Pulmonary Clearance needed: no  *Orders entered into EPIC  Date: 06/09/23   *Case booked in [place]  Date: 06/09/23  *Notified pt of Surgery: Date: 06/09/23  PRE-OP UA & CX:no  *Placed into Prior Authorization Work Angela Nevin Date: 06/09/23  Assistant/laser/rep:No

## 2023-06-09 NOTE — Telephone Encounter (Signed)
  Per Dr. Lonna Cobb, Patient is to be scheduled for Left Ureteroscopy with Laser Lithotripsy and Stent Placement.  Claire Day was contacted and possible surgical dates were discussed, Friday November 15th, 2024 was agreed upon for surgery.   Patient was directed to call [phone] between 1-3pm the day before surgery to find out surgical arrival time.  Instructions were given not to eat or drink from midnight on the night before surgery and have a driver for the day of surgery. On the surgery day patient was instructed to enter through the [facility] entrance of [facility] report the Same Day Surgery desk.   Pre-Admit Testing will be in contact via phone to set up an interview with the anesthesia team to review your history and medications prior to surgery.   Reminder of this information was sent with the patient while she was in the office.

## 2023-06-09 NOTE — H&P (View-Only) (Signed)
 I,Amy L Pierron,acting as a scribe for [name], MD.,have documented all relevant documentation on the behalf of [name], MD,as directed by  [name], MD while in the presence of [name], MD.  06/09/2023 11:03 AM   Claire Day 13-Oct-1965 [id number]  Referring provider: Aggie Cosier, MD [facility] [address] [place],  [place] [address]  Chief Complaint  Patient presents with   Nephrolithiasis    HPI: Claire Day is a 57 y.o. female presents in follow-up of a recent ED visit for renal colic.  Has been seen in the ED in [place], [place] on two occasions in the last 2 months. She was most recently seen earlier this week and had a CT showing a 6mm left proximal ureteral calculus with hydronephrosis. She has a prior history of stone disease with previous uretroscopic stone removal and SWL, however, cannot get in to see the urologist in [place] for at least 1 month. She presented to [facility] ED yesterday. She had been started on Keflex for a possible UTI. She complains of left flank pain radiating to the left lower quadrant, which is severe. She's had nausea and vomiting, but no fever or chills. Urinalysis in the ED yesterday showed >50 RBCs/ > 50 WBCs, with 0-5 epithelial cells. Urine culture was ordered and she was started on Cefuroxime. KUB performed in ED yesterday shows large amount of stool and bowel gas. There are calcifications overlying the left renal outline. Do not see a definite proximal ureteral calculus.   PMH: Past Medical History:  Diagnosis Date   HIV (human immunodeficiency virus infection) (HCC)    Hypertension    MI (myocardial infarction) (HCC)     Surgical History: Past Surgical History:  Procedure Laterality Date   CHOLECYSTECTOMY     PACEMAKER IMPLANT      Home Medications:  Allergies as of 06/09/2023       Reactions   Sulfamethoxazole-trimethoprim Hives   Tramadol         Medication List         Accurate as of June 09, 2023 11:03 AM. If you have any questions, ask your nurse or doctor.          albuterol 108 (90 Base) MCG/ACT inhaler Commonly known as: VENTOLIN HFA Inhale 2 puffs into the lungs every 4 (four) hours as needed for wheezing or shortness of breath.   atazanavir 150 MG capsule Commonly known as: REYATAZ Take 300 mg by mouth 2 (two) times daily.   cefdinir 300 MG capsule Commonly known as: OMNICEF Take 1 capsule (300 mg total) by mouth 2 (two) times daily.   HYDROcodone-acetaminophen 5-325 MG tablet Commonly known as: NORCO/VICODIN Take 1 tablet by mouth every 6 (six) hours as needed for moderate pain (pain score 4-6).   ISENTRESS PO Take 1 tablet by mouth 2 (two) times daily.   naproxen 500 MG tablet Commonly known as: Naprosyn Take 1 tablet (500 mg total) by mouth 2 (two) times daily with a meal.   ondansetron 4 MG disintegrating tablet Commonly known as: Zofran ODT Take 1 tablet (4 mg total) by mouth every 8 (eight) hours as needed for nausea.   predniSONE 20 MG tablet Commonly known as: DELTASONE Take 2 tablets (40 mg total) by mouth daily.   ritonavir 100 MG Tabs tablet Commonly known as: NORVIR Take 100 mg by mouth daily.        Allergies:  Allergies  Allergen Reactions   Sulfamethoxazole-Trimethoprim  Hives   Tramadol     Family History: No family history on file.  Social History:  reports that she has been smoking cigarettes. She does not have any smokeless tobacco history on file. She reports that she does not drink alcohol and does not use drugs.   Physical Exam: BP 125/79   Pulse (!) 108   Ht 5\' 8"  (1.727 m)   Wt 103 lb (46.7 kg)   BMI 15.66 kg/m   Constitutional:  Alert and oriented, No mild distress secondary to stone pain. HEENT: [place] AT Respiratory: Normal respiratory effort, no increased work of breathing. Psychiatric: Normal mood and affect.  Laboratory Data: Lab Results  Component Value Date   WBC  5.2 06/08/2023   HGB 14.4 06/08/2023   HCT 44.9 06/08/2023   MCV 98.9 06/08/2023   PLT 122 (L) 06/08/2023    Lab Results  Component Value Date   CREATININE 1.59 (H) 06/08/2023    Urinalysis    Component Value Date/Time   COLORURINE YELLOW (A) 06/08/2023 1220   APPEARANCEUR HAZY (A) 06/08/2023 1220   LABSPEC 1.015 06/08/2023 1220   PHURINE 7.0 06/08/2023 1220   GLUCOSEU NEGATIVE 06/08/2023 1220   GLUCOSEU NEG mg/dL 01/60/1093 2355   HGBUR LARGE (A) 06/08/2023 1220   BILIRUBINUR NEGATIVE 06/08/2023 1220   KETONESUR NEGATIVE 06/08/2023 1220   PROTEINUR 100 (A) 06/08/2023 1220   UROBILINOGEN 0.2 04/22/2014 2209   NITRITE NEGATIVE 06/08/2023 1220   LEUKOCYTESUR MODERATE (A) 06/08/2023 1220    Lab Results  Component Value Date   BACTERIA RARE (A) 06/08/2023    Assessment & Plan:    1.  Left proximal ureteral calculus Significant pain which has been difficult for her to find relief.  Urine culture is pending and if negative I offered her left ureteroscopy with treatment of her ureteral and renal calculi early next week. Since she has severe pain, she was also given the option of stent placement as a temporary measure relieve her obstruction and pain with subsequent definitive stone treatment in 2-3 weeks. She has elected to proceed with stent placement tomorrow and Dr. Richardo Hanks will add to his schedule. We discussed potential complications, including bleeding, infection/ sepsis. The low likelihood of inability to place the stent secondary to stone impaction with need for percutaneous nephrostomy was discussed. All questions were answered.   I have reviewed the above documentation for accuracy and completeness, and I agree with the above.   [name], MD  [facility] [address], [address] [place], [place] [address] [phone]

## 2023-06-09 NOTE — H&P (View-Only) (Signed)
 I,Claire Day,acting as a scribe for Claire Altes, MD.,have documented all relevant documentation on the behalf of Claire Altes, MD,as directed by  Claire Altes, MD while in the presence of Claire Altes, MD.  06/09/2023 11:03 AM   Claire Day 13-Oct-1965 [id number]  Referring provider: Aggie Cosier, MD [facility] [address] [place],  [place] [address]  Chief Complaint  Patient presents with   Nephrolithiasis    HPI: Claire Day is a 57 y.o. female presents in follow-up of a recent ED visit for renal colic.  Has been seen in the ED in [place], [place] on two occasions in the last 2 months. She was most recently seen earlier this week and had a CT showing a 6mm left proximal ureteral calculus with hydronephrosis. She has a prior history of stone disease with previous uretroscopic stone removal and SWL, however, cannot get in to see the urologist in [place] for at least 1 month. She presented to [facility] ED yesterday. She had been started on Keflex for a possible UTI. She complains of left flank pain radiating to the left lower quadrant, which is severe. She's had nausea and vomiting, but no fever or chills. Urinalysis in the ED yesterday showed >50 RBCs/ > 50 WBCs, with 0-5 epithelial cells. Urine culture was ordered and she was started on Cefuroxime. KUB performed in ED yesterday shows large amount of stool and bowel gas. There are calcifications overlying the left renal outline. Do not see a definite proximal ureteral calculus.   PMH: Past Medical History:  Diagnosis Date   HIV (human immunodeficiency virus infection) (HCC)    Hypertension    MI (myocardial infarction) (HCC)     Surgical History: Past Surgical History:  Procedure Laterality Date   CHOLECYSTECTOMY     PACEMAKER IMPLANT      Home Medications:  Allergies as of 06/09/2023       Reactions   Sulfamethoxazole-trimethoprim Hives   Tramadol         Medication List         Accurate as of June 09, 2023 11:03 AM. If you have any questions, ask your nurse or doctor.          albuterol 108 (90 Base) MCG/ACT inhaler Commonly known as: VENTOLIN HFA Inhale 2 puffs into the lungs every 4 (four) hours as needed for wheezing or shortness of breath.   atazanavir 150 MG capsule Commonly known as: REYATAZ Take 300 mg by mouth 2 (two) times daily.   cefdinir 300 MG capsule Commonly known as: OMNICEF Take 1 capsule (300 mg total) by mouth 2 (two) times daily.   HYDROcodone-acetaminophen 5-325 MG tablet Commonly known as: NORCO/VICODIN Take 1 tablet by mouth every 6 (six) hours as needed for moderate pain (pain score 4-6).   ISENTRESS PO Take 1 tablet by mouth 2 (two) times daily.   naproxen 500 MG tablet Commonly known as: Naprosyn Take 1 tablet (500 mg total) by mouth 2 (two) times daily with a meal.   ondansetron 4 MG disintegrating tablet Commonly known as: Zofran ODT Take 1 tablet (4 mg total) by mouth every 8 (eight) hours as needed for nausea.   predniSONE 20 MG tablet Commonly known as: DELTASONE Take 2 tablets (40 mg total) by mouth daily.   ritonavir 100 MG Tabs tablet Commonly known as: NORVIR Take 100 mg by mouth daily.        Allergies:  Allergies  Allergen Reactions   Sulfamethoxazole-Trimethoprim  Hives   Tramadol     Family History: No family history on file.  Social History:  reports that she has been smoking cigarettes. She does not have any smokeless tobacco history on file. She reports that she does not drink alcohol and does not use drugs.   Physical Exam: BP 125/79   Pulse (!) 108   Ht 5\' 8"  (1.727 m)   Wt 103 lb (46.7 kg)   BMI 15.66 kg/m   Constitutional:  Alert and oriented, No mild distress secondary to stone pain. HEENT: [place] AT Respiratory: Normal respiratory effort, no increased work of breathing. Psychiatric: Normal mood and affect.  Laboratory Data: Lab Results  Component Value Date   WBC  5.2 06/08/2023   HGB 14.4 06/08/2023   HCT 44.9 06/08/2023   MCV 98.9 06/08/2023   PLT 122 (L) 06/08/2023    Lab Results  Component Value Date   CREATININE 1.59 (H) 06/08/2023    Urinalysis    Component Value Date/Time   COLORURINE YELLOW (A) 06/08/2023 1220   APPEARANCEUR HAZY (A) 06/08/2023 1220   LABSPEC 1.015 06/08/2023 1220   PHURINE 7.0 06/08/2023 1220   GLUCOSEU NEGATIVE 06/08/2023 1220   GLUCOSEU NEG mg/dL 01/60/1093 2355   HGBUR LARGE (A) 06/08/2023 1220   BILIRUBINUR NEGATIVE 06/08/2023 1220   KETONESUR NEGATIVE 06/08/2023 1220   PROTEINUR 100 (A) 06/08/2023 1220   UROBILINOGEN 0.2 04/22/2014 2209   NITRITE NEGATIVE 06/08/2023 1220   LEUKOCYTESUR MODERATE (A) 06/08/2023 1220    Lab Results  Component Value Date   BACTERIA RARE (A) 06/08/2023    Assessment & Plan:    1.  Left proximal ureteral calculus Significant pain which has been difficult for her to find relief.  Urine culture is pending and if negative I offered her left ureteroscopy with treatment of her ureteral and renal calculi early next week. Since she has severe pain, she was also given the option of stent placement as a temporary measure relieve her obstruction and pain with subsequent definitive stone treatment in 2-3 weeks. She has elected to proceed with stent placement tomorrow and Dr. Richardo Day will add to his schedule. We discussed potential complications, including bleeding, infection/ sepsis. The low likelihood of inability to place the stent secondary to stone impaction with need for percutaneous nephrostomy was discussed. All questions were answered.   I have reviewed the above documentation for accuracy and completeness, and I agree with the above.   Claire Altes, MD  [facility] [address], [address] [place], [place] [address] [phone]

## 2023-06-10 ENCOUNTER — Ambulatory Visit
Admission: RE | Admit: 2023-06-10 | Discharge: 2023-06-10 | Disposition: A | Discharge status: Home / Self Care | Payer: Medicare HMO | Attending: Urology | Admitting: Urology

## 2023-06-10 ENCOUNTER — Ambulatory Visit: Payer: Medicare HMO | Admitting: Anesthesiology

## 2023-06-10 ENCOUNTER — Encounter: Payer: Self-pay | Admitting: Urology

## 2023-06-10 ENCOUNTER — Encounter
Admission: RE | Disposition: A | Discharge status: Home / Self Care | Payer: Self-pay | Source: Home / Self Care | Attending: Urology

## 2023-06-10 ENCOUNTER — Other Ambulatory Visit: Payer: Self-pay

## 2023-06-10 ENCOUNTER — Ambulatory Visit: Payer: Medicare HMO

## 2023-06-10 DIAGNOSIS — N390 Urinary tract infection, site not specified: Secondary | ICD-10-CM

## 2023-06-10 DIAGNOSIS — N136 Pyonephrosis: Secondary | ICD-10-CM | POA: Diagnosis present

## 2023-06-10 DIAGNOSIS — N201 Calculus of ureter: Secondary | ICD-10-CM

## 2023-06-10 DIAGNOSIS — Z01818 Encounter for other preprocedural examination: Secondary | ICD-10-CM

## 2023-06-10 HISTORY — PX: CYSTOSCOPY W/ URETERAL STENT PLACEMENT: SHX1429

## 2023-06-10 LAB — URINE CULTURE: Culture: NO GROWTH

## 2023-06-10 SURGERY — CYSTOSCOPY, WITH RETROGRADE PYELOGRAM AND URETERAL STENT INSERTION
Anesthesia: General | Laterality: Left

## 2023-06-10 MED ORDER — ORAL CARE MOUTH RINSE: 15.0000 mL | Freq: Once | OROMUCOSAL | Status: AC

## 2023-06-10 MED ORDER — CHLORHEXIDINE GLUCONATE 0.12 % MT SOLN
OROMUCOSAL | Status: AC
  Filled 2023-06-10: qty 15

## 2023-06-10 MED ORDER — PROPOFOL 10 MG/ML IV BOLUS
INTRAVENOUS | Status: DC | PRN
  Administered 2023-06-10: 50 mg via INTRAVENOUS
  Administered 2023-06-10: 150 ug/kg/min via INTRAVENOUS

## 2023-06-10 MED ORDER — PROPOFOL 10 MG/ML IV BOLUS
INTRAVENOUS | Status: AC
  Filled 2023-06-10: qty 40

## 2023-06-10 MED ORDER — OXYCODONE HCL 5 MG PO TABS: 5.0000 mg | ORAL_TABLET | Freq: Once | ORAL | Status: DC | PRN

## 2023-06-10 MED ORDER — CHLORHEXIDINE GLUCONATE 0.12 % MT SOLN
15.0000 mL | Freq: Once | OROMUCOSAL | Status: AC
  Administered 2023-06-10: 15 mL via OROMUCOSAL

## 2023-06-10 MED ORDER — LIDOCAINE HCL (CARDIAC) PF 100 MG/5ML IV SOSY
PREFILLED_SYRINGE | INTRAVENOUS | Status: DC | PRN
  Administered 2023-06-10: 100 mg via INTRAVENOUS

## 2023-06-10 MED ORDER — OXYCODONE HCL 5 MG/5ML PO SOLN: 5.0000 mg | Freq: Once | ORAL | Status: DC | PRN

## 2023-06-10 MED ORDER — SODIUM CHLORIDE 0.9 % IV SOLN
1.0000 g | INTRAVENOUS | Status: AC
  Administered 2023-06-10: 1 g via INTRAVENOUS
  Filled 2023-06-10: qty 10

## 2023-06-10 MED ORDER — FENTANYL CITRATE (PF) 100 MCG/2ML IJ SOLN: 25.0000 ug | INTRAMUSCULAR | Status: DC | PRN

## 2023-06-10 MED ORDER — PROPOFOL 1000 MG/100ML IV EMUL
INTRAVENOUS | Status: AC
  Filled 2023-06-10: qty 100

## 2023-06-10 MED ORDER — IOHEXOL 180 MG/ML  SOLN
INTRAMUSCULAR | Status: DC | PRN
  Administered 2023-06-10: 10 mL

## 2023-06-10 MED ORDER — SODIUM CHLORIDE 0.9 % IR SOLN
Status: DC | PRN
  Administered 2023-06-10: 3000 mL via INTRAVESICAL

## 2023-06-10 MED ORDER — SODIUM CHLORIDE 0.9 % IV SOLN: INTRAVENOUS | Status: DC

## 2023-06-10 SURGICAL SUPPLY — 20 items
BAG DRAIN SIEMENS DORNER NS (MISCELLANEOUS) ×1 IMPLANT
BRUSH SCRUB EZ 1% IODOPHOR (MISCELLANEOUS) IMPLANT
CATH URETL OPEN 5X70 (CATHETERS) ×1 IMPLANT
GLOVE BIOGEL PI IND STRL 7.5 (GLOVE) ×1 IMPLANT
GOWN STRL REUS W/ TWL LRG LVL3 (GOWN DISPOSABLE) ×1 IMPLANT
GOWN STRL REUS W/ TWL XL LVL3 (GOWN DISPOSABLE) ×1 IMPLANT
GOWN STRL REUS W/TWL LRG LVL3 (GOWN DISPOSABLE) ×1
GOWN STRL REUS W/TWL XL LVL3 (GOWN DISPOSABLE) ×1
GUIDEWIRE STR DUAL SENSOR (WIRE) ×1 IMPLANT
IV NS IRRIG 3000ML ARTHROMATIC (IV SOLUTION) ×1 IMPLANT
KIT TURNOVER CYSTO (KITS) ×1 IMPLANT
PACK CYSTO AR (MISCELLANEOUS) ×1 IMPLANT
SET CYSTO W/LG BORE CLAMP LF (SET/KITS/TRAYS/PACK) ×1 IMPLANT
STENT URET 6FRX24 CONTOUR (STENTS) IMPLANT
STENT URET 6FRX26 CONTOUR (STENTS) IMPLANT
SURGILUBE 2OZ TUBE FLIPTOP (MISCELLANEOUS) ×1 IMPLANT
SYR TOOMEY IRRIG 70ML (MISCELLANEOUS)
SYRINGE TOOMEY IRRIG 70ML (MISCELLANEOUS) IMPLANT
WATER STERILE IRR 1000ML POUR (IV SOLUTION) ×1 IMPLANT
WATER STERILE IRR 500ML POUR (IV SOLUTION) ×1 IMPLANT

## 2023-06-10 NOTE — Op Note (Signed)
Date of procedure: 06/10/23  Preoperative diagnosis:  Left proximal ureteral stone UTI  Postoperative diagnosis:  Same  Procedure: Cystoscopy Left retrograde pyelogram with intraoperative interpretation, left ureteral stent placement Right retrograde pyelogram with intraoperative interpretation  Surgeon: Legrand Rams, MD  Anesthesia: General  Complications: None  Intraoperative findings:  Normal cystoscopy, left kidney with hydronephrosis and curl in proximal ureter, uncomplicated left ureteral stent placement There was a calcification over the right mid abdomen, and I performed a right retrograde pyelogram which showed no hydronephrosis or evidence of right sided ureteral stones  EBL: None  Specimens: None  Drains: Left 6 French by 26 cm ureteral stent  Indication: Claire Day is a 57 y.o. patient with reported 6 mm left proximal ureteral stone on outside CT and ongoing renal colic as well as suspected UTI.  She was started on antibiotics yesterday, and set up for left ureteral stent placement today with plan for delayed management after treatment of her infection..  After reviewing the management options for treatment, they elected to proceed with the above surgical procedure(s). We have discussed the potential benefits and risks of the procedure, side effects of the proposed treatment, the likelihood of the patient achieving the goals of the procedure, and any potential problems that might occur during the procedure or recuperation. Informed consent has been obtained.  Description of procedure:  The patient was taken to the operating room and general anesthesia was induced. SCDs were placed for DVT prophylaxis.. The patient was placed in the dorsal lithotomy position, prepped and draped in the usual sterile fashion, and preoperative antibiotics(Ancef) were administered. A preoperative time-out was performed.   A 21 French rigid cystoscope was used to intubate the urethra  and cystoscopy showed no obvious abnormal findings.  A sensor wire was advanced into the left ureteral orifice and a 5 French access catheter advanced over the wire.  A retrograde pyelogram showed hydronephrosis on the left side with a curl in the proximal ureter.  With the aid of the access catheter, the sensor wire was advanced into the upper pole.  There was purulent drainage noted.  A 6 French by 26 cm ureteral stent was uneventfully placed.  Under fluoroscopy I could see a large circular calcification in the right abdomen.  Since I did not have access to her prior outside CT scan, I opted to perform a right retrograde pyelogram to rule out any right sided ureteral stones.  The access catheter was advanced into the right ureteral orifice and contrast injected which showed no evidence of ureteral stone or hydronephrosis.  The bladder was drained and this concluded procedure.  Disposition: Stable to PACU  Plan: Follow-up with Dr. Lonna Cobb in 2 to 3 weeks for left-sided ureteroscopy, laser lithotripsy, stent change Continue antibiotics  Legrand Rams, MD

## 2023-06-10 NOTE — Progress Notes (Signed)
Patient awake/alert x4.  BP elevated:  per patient she did not take her hypertensive medications today. Anesthesia:  Dr. Suzan Slick made aware:  ok for patient to go home and instruct to take her bp medications, patient verbalizes understanding.  Denies any pain, shortness of breath. Tolerating soda, crackers, coffee without event.  Voided on bedpan:  slight blood tinged no clots.  To remain on po antibiotics.

## 2023-06-10 NOTE — Interval H&P Note (Signed)
UROLOGY H&P UPDATE  Agree with prior H&P dated 06/09/2023 by Dr. Lonna Cobb.  6 mm left proximal ureteral stone, concern for possible UTI, set up for stent placement today with plan for delayed ureteroscopy after treatment with antibiotics.  She denies any fevers or chills.  Primary complaint is left flank pain.  Cardiac: RRR Lungs: CTA bilaterally  Laterality: Left Procedure: Cystoscopy , left ureteral stent placement  We discussed the need for drainage in the setting of an infected and obstructed system.  A ureteral stent is a small plastic tube that is placed cystoscopically with one end in the kidney and the other end in the bladder that allows the infection from the kidney to drain, and relieves pain from the obstructing stone.  We discussed the risks at length including bleeding, infection, sepsis, death, ureteral injury, and stent related symptoms including urgency/frequency/dysuria/flank pain/gross hematuria.  There is a low, but not 0, risk of inability to pass the ureteral stent alongside the stone from below which would require percutaneous nephrostomy tube by interventional radiology.  Finally, we discussed possible prolonged hospitalization and recovery, possible temporary Foley catheter placement, and 10 to 14-day course of antibiotics.  We reviewed the need for a follow-up procedure for definitive management of their stone when the infection has been treated in 2 to 3 weeks with ureteroscopy/laser lithotripsy.   Sondra Come, MD 06/10/2023

## 2023-06-10 NOTE — Anesthesia Preprocedure Evaluation (Addendum)
Anesthesia Evaluation  Patient identified by MRN, date of birth, ID band Patient awake    Reviewed: Allergy & Precautions, H&P , NPO status , Patient's Chart, lab work & pertinent test results  Airway Mallampati: II  TM Distance: >3 FB Neck ROM: full    Dental no notable dental hx. (+) Dental Advidsory Given   Pulmonary Current Smoker and Patient abstained from smoking.   Pulmonary exam normal        Cardiovascular hypertension, On Medications + Past MI  Normal cardiovascular exam+ pacemaker + Cardiac Defibrillator + Valvular Problems/Murmurs MR and AI      Neuro/Psych negative neurological ROS  negative psych ROS   GI/Hepatic negative GI ROS, Neg liver ROS,,,  Endo/Other  negative endocrine ROS    Renal/GU Renal InsufficiencyRenal disease     Musculoskeletal   Abdominal   Peds  Hematology HIV currently on treatment   Anesthesia Other Findings Past Medical History: No date: HIV (human immunodeficiency virus infection) (HCC) No date: Hypertension No date: MI (myocardial infarction) (HCC)  Past Surgical History: No date: CHOLECYSTECTOMY No date: PACEMAKER IMPLANT     Reproductive/Obstetrics negative OB ROS                             Anesthesia Physical Anesthesia Plan  ASA: 3  Anesthesia Plan: General ETT and General   Post-op Pain Management: Ofirmev IV (intra-op)*   Induction: Intravenous  PONV Risk Score and Plan: 2 and Ondansetron, Dexamethasone and Midazolam  Airway Management Planned: Oral ETT  Additional Equipment:   Intra-op Plan:   Post-operative Plan: Extubation in OR  Informed Consent: I have reviewed the patients History and Physical, chart, labs and discussed the procedure including the risks, benefits and alternatives for the proposed anesthesia with the patient or authorized representative who has indicated his/her understanding and acceptance.      Dental Advisory Given  Plan Discussed with: CRNA and Surgeon  Anesthesia Plan Comments: (Patient consented for risks of anesthesia including but not limited to:  - adverse reactions to medications - damage to eyes, teeth, lips or other oral mucosa - nerve damage due to positioning  - sore throat or hoarseness - Damage to heart, brain, nerves, lungs, other parts of body or loss of life  Patient voiced understanding and assent.  Addendum by Suzan Slick, MD @ [phone]: Patient states she had some transient nausea earlier this morning (I questioned her after seeing a blue emesis bag on her bed, unused). She attributes it to empty stomach and nerves. She denies ever vomiting, gagging, retching. She feels well currently. Still OK for natural airway. I did discuss possibility of advanced airway if she did indeed have regurgitation of stomach contents, and the risk of aspiration. She understands.)        Anesthesia Quick Evaluation

## 2023-06-10 NOTE — Anesthesia Postprocedure Evaluation (Signed)
Anesthesia Post Note  Patient: Claire Day  Procedure(s) Performed: CYSTOSCOPY WITH RETROGRADE PYELOGRAM/URETERAL STENT PLACEMENT (Left)  Patient location during evaluation: PACU Anesthesia Type: General Level of consciousness: awake and alert Pain management: pain level controlled Vital Signs Assessment: post-procedure vital signs reviewed and stable Respiratory status: spontaneous breathing, nonlabored ventilation, respiratory function stable and patient connected to nasal cannula oxygen Cardiovascular status: blood pressure returned to baseline and stable Postop Assessment: no apparent nausea or vomiting Anesthetic complications: no   There were no known notable events for this encounter.   Last Vitals:  Vitals:   06/10/23 1313 06/10/23 1539  BP: (!) 157/94 123/77  Pulse: (!) 105 96  Resp: 18 (!) 28  Temp: 36.9 C 36.7 C  SpO2: 96% 95%    Last Pain:  Vitals:   06/10/23 1313  TempSrc: Tympanic  PainSc: 8                  Corinda Gubler

## 2023-06-10 NOTE — Transfer of Care (Signed)
Immediate Anesthesia Transfer of Care Note  Patient: Claire Day  Procedure(s) Performed: CYSTOSCOPY WITH RETROGRADE PYELOGRAM/URETERAL STENT PLACEMENT (Left)  Patient Location: PACU  Anesthesia Type:General  Level of Consciousness: drowsy and patient cooperative  Airway & Oxygen Therapy: Patient Spontanous Breathing  Post-op Assessment: Report given to RN and Post -op Vital signs reviewed and stable  Post vital signs: stable  Last Vitals:  Vitals Value Taken Time  BP 123/77 06/10/23 1539  Temp    Pulse 97 06/10/23 1540  Resp 25 06/10/23 1540  SpO2 94 % 06/10/23 1540  Vitals shown include unfiled device data.  Last Pain:  Vitals:   06/10/23 1313  TempSrc: Tympanic  PainSc: 8          Complications: No notable events documented.

## 2023-06-11 ENCOUNTER — Encounter: Payer: Self-pay | Admitting: Urology

## 2023-06-12 LAB — CULTURE, URINE COMPREHENSIVE

## 2023-06-30 ENCOUNTER — Other Ambulatory Visit: Payer: Self-pay | Admitting: Urology

## 2023-06-30 ENCOUNTER — Other Ambulatory Visit: Payer: Self-pay

## 2023-06-30 DIAGNOSIS — N201 Calculus of ureter: Secondary | ICD-10-CM

## 2023-06-30 NOTE — Progress Notes (Signed)
Surgical Physician Order Form  [facility] [place]  Dr. Irineo Axon, MD  * Scheduling expectation : Next Available  *Length of Case: 90 minutes  *Clearance needed: no  *Anticoagulation Instructions: N/A  *Aspirin Instructions: N/A  *Post-op visit Date/Instructions:  1 week cysto stent removal  *Diagnosis: Left Ureteral Stone  *Procedure: left  Ureteroscopy w/laser lithotripsy & stent exchange ([id number])   Additional orders: N/A  -Admit type: OUTpatient  -Anesthesia: General  -VTE Prophylaxis Standing Order SCD's       Other:   -Standing Lab Orders Per Anesthesia    Lab other: None  -Standing Test orders EKG/Chest x-ray per Anesthesia       Test other:   - Medications:  Ceftriaxone(Rocephin) 1gm IV  -Other orders:  N/A

## 2023-07-01 NOTE — Progress Notes (Signed)
   [facility] Surgical Posting Form  Surgery Date: Date: 07/05/2023  Surgeon: Dr. Irineo Axon, MD  Inpt ( No  )   Outpt (Yes)   Obs ( No  )   Diagnosis: N20.1 Left Ureteral Stone  -CPT: [phone]  Surgery: Left Ureteroscopy with Laser Lithotripsy and Stent Exchange  Stop Anticoagulations: N/A  Cardiac/Medical/Pulmonary Clearance needed: no  *Orders entered into EPIC  Date: 07/01/23   *Case booked in [place]  Date: 06/30/2023  *Notified pt of Surgery: Date: 06/30/2023  PRE-OP UA & CX:No  *Placed into Prior Authorization Work Angela Nevin Date: 07/01/23  Assistant/laser/rep:No

## 2023-07-04 ENCOUNTER — Encounter: Payer: Self-pay | Admitting: Urology

## 2023-07-04 ENCOUNTER — Encounter
Admission: RE | Admit: 2023-07-04 | Discharge: 2023-07-04 | Disposition: A | Discharge status: Home / Self Care | Payer: Medicare HMO | Source: Ambulatory Visit | Attending: Urology | Admitting: Urology

## 2023-07-04 HISTORY — DX: Chronic systolic (congestive) heart failure: I50.22

## 2023-07-04 HISTORY — DX: Allergic rhinitis, unspecified: J30.9

## 2023-07-04 HISTORY — DX: Chronic obstructive pulmonary disease, unspecified: J44.9

## 2023-07-04 HISTORY — DX: Other cardiomyopathies: I42.8

## 2023-07-04 HISTORY — DX: Left bundle-branch block, unspecified: I44.7

## 2023-07-04 HISTORY — DX: Thrombocytopenia, unspecified: D69.6

## 2023-07-04 HISTORY — DX: Nonrheumatic aortic (valve) insufficiency: I35.1

## 2023-07-04 HISTORY — DX: Carcinoma in situ of cervix, unspecified: D06.9

## 2023-07-04 HISTORY — DX: Chronic kidney disease, stage 3 unspecified: N18.30

## 2023-07-04 HISTORY — DX: Personal history of urinary calculi: Z87.442

## 2023-07-04 HISTORY — DX: Anemia, unspecified: D64.9

## 2023-07-04 HISTORY — DX: Unspecified hydronephrosis: N13.30

## 2023-07-04 HISTORY — DX: Calculus of kidney: N20.0

## 2023-07-04 HISTORY — DX: Pneumonia due to other streptococci: J15.4

## 2023-07-04 HISTORY — DX: Tobacco use: Z72.0

## 2023-07-04 HISTORY — DX: Hyperlipidemia, unspecified: E78.5

## 2023-07-04 HISTORY — DX: Nonrheumatic mitral (valve) insufficiency: I34.0

## 2023-07-04 NOTE — Patient Instructions (Addendum)
Your procedure is scheduled on:07-05-23 Tuesday Report to the Registration Desk on the 1st floor of the [facility].Then proceed to the 2nd floor Surgery Desk. Arrive at 10 am   REMEMBER: Instructions that are not followed completely may result in serious medical risk, up to and including death; or upon the discretion of your surgeon and anesthesiologist your surgery may need to be rescheduled.  Do not eat food OR drink any liquids after midnight the night before surgery.  No gum chewing or hard candies.  One week prior to surgery:Stop NOW (07-04-23) Stop Anti-inflammatories (NSAIDS) such as Advil, Aleve, Ibuprofen, Motrin, Naproxen, Naprosyn and Aspirin based products such as Excedrin, Goody's Powder, BC Powder. Stop ANY OVER THE COUNTER supplements until after surgery.  You may however, continue to take Tylenol if needed for pain up until the day of surgery.  Continue taking all of your other prescription medications up until the day of surgery.  Do NOT take any medication the day of surgery  Use your Albuterol Inhaler the day of surgery and bring your Albuterol Inhaler to the hospital  No Alcohol for 24 hours before or after surgery.  No Smoking including e-cigarettes for 24 hours before surgery.  No chewable tobacco products for at least 6 hours before surgery.  No nicotine patches on the day of surgery.  Do not use any "recreational" drugs for at least a week (preferably 2 weeks) before your surgery.  Please be advised that the combination of cocaine and anesthesia may have negative outcomes, up to and including death. If you test positive for cocaine, your surgery will be cancelled.  On the morning of surgery brush your teeth with toothpaste and water, you may rinse your mouth with mouthwash if you wish. Do not swallow any toothpaste or mouthwash.  Do not wear jewelry, make-up, hairpins, clips or nail polish.  For welded (permanent) jewelry: bracelets, anklets, waist bands,  etc.  Please have this removed prior to surgery.  If it is not removed, there is a chance that hospital personnel will need to cut it off on the day of surgery.  Do not wear lotions, powders, or perfumes.   Do not shave body hair from the neck down 48 hours before surgery.  Contact lenses, hearing aids and dentures may not be worn into surgery.  Do not bring valuables to the hospital. [facility] is not responsible for any missing/lost belongings or valuables.   Notify your doctor if there is any change in your medical condition (cold, fever, infection).  Wear comfortable clothing (specific to your surgery type) to the hospital.  After surgery, you can help prevent lung complications by doing breathing exercises.  Take deep breaths and cough every 1-2 hours. Your doctor may order a device called an Incentive Spirometer to help you take deep breaths. When coughing or sneezing, hold a pillow firmly against your incision with both hands. This is called "splinting." Doing this helps protect your incision. It also decreases belly discomfort.  If you are being admitted to the hospital overnight, leave your suitcase in the car. After surgery it may be brought to your room.  In case of increased patient census, it may be necessary for you, the patient, to continue your postoperative care in the Same Day Surgery department.  If you are being discharged the day of surgery, you will not be allowed to drive home. You will need a responsible individual to drive you home and stay with you for 24 hours after surgery.  If you are taking public transportation, you will need to have a responsible individual with you.  Please call the Pre-admissions Testing Dept. at [phone] if you have any questions about these instructions.  Surgery Visitation Policy:  Patients having surgery or a procedure may have two visitors.  Children under the age of 54 must have an adult with them who is not the  patient.

## 2023-07-05 ENCOUNTER — Ambulatory Visit: Payer: Self-pay | Admitting: Urgent Care

## 2023-07-05 ENCOUNTER — Ambulatory Visit: Payer: Medicare HMO

## 2023-07-05 ENCOUNTER — Encounter: Payer: Self-pay | Admitting: Urology

## 2023-07-05 ENCOUNTER — Encounter
Admission: RE | Disposition: A | Discharge status: Home / Self Care | Payer: Self-pay | Source: Home / Self Care | Attending: Urology

## 2023-07-05 ENCOUNTER — Ambulatory Visit
Admission: RE | Admit: 2023-07-05 | Discharge: 2023-07-05 | Disposition: A | Discharge status: Home / Self Care | Payer: Medicare HMO | Attending: Urology | Admitting: Urology

## 2023-07-05 ENCOUNTER — Other Ambulatory Visit: Payer: Self-pay

## 2023-07-05 DIAGNOSIS — N132 Hydronephrosis with renal and ureteral calculous obstruction: Secondary | ICD-10-CM | POA: Diagnosis present

## 2023-07-05 DIAGNOSIS — F17200 Nicotine dependence, unspecified, uncomplicated: Secondary | ICD-10-CM | POA: Insufficient documentation

## 2023-07-05 DIAGNOSIS — N200 Calculus of kidney: Secondary | ICD-10-CM

## 2023-07-05 DIAGNOSIS — N189 Chronic kidney disease, unspecified: Secondary | ICD-10-CM | POA: Diagnosis not present

## 2023-07-05 DIAGNOSIS — N202 Calculus of kidney with calculus of ureter: Secondary | ICD-10-CM

## 2023-07-05 DIAGNOSIS — I252 Old myocardial infarction: Secondary | ICD-10-CM | POA: Insufficient documentation

## 2023-07-05 DIAGNOSIS — J449 Chronic obstructive pulmonary disease, unspecified: Secondary | ICD-10-CM | POA: Insufficient documentation

## 2023-07-05 DIAGNOSIS — I130 Hypertensive heart and chronic kidney disease with heart failure and stage 1 through stage 4 chronic kidney disease, or unspecified chronic kidney disease: Secondary | ICD-10-CM | POA: Insufficient documentation

## 2023-07-05 DIAGNOSIS — N201 Calculus of ureter: Secondary | ICD-10-CM

## 2023-07-05 DIAGNOSIS — I509 Heart failure, unspecified: Secondary | ICD-10-CM | POA: Diagnosis not present

## 2023-07-05 HISTORY — PX: CYSTOSCOPY/URETEROSCOPY/HOLMIUM LASER/STENT PLACEMENT: SHX6546

## 2023-07-05 SURGERY — CYSTOSCOPY/URETEROSCOPY/HOLMIUM LASER/STENT PLACEMENT
Anesthesia: General | Laterality: Left

## 2023-07-05 MED ORDER — PROPOFOL 10 MG/ML IV BOLUS
INTRAVENOUS | Status: DC | PRN
  Administered 2023-07-05: 60 mg via INTRAVENOUS

## 2023-07-05 MED ORDER — SODIUM CHLORIDE 0.9 % IV SOLN: INTRAVENOUS | Status: DC

## 2023-07-05 MED ORDER — MIDAZOLAM HCL 2 MG/2ML IJ SOLN
INTRAMUSCULAR | Status: AC
  Filled 2023-07-05: qty 2

## 2023-07-05 MED ORDER — CHLORHEXIDINE GLUCONATE 0.12 % MT SOLN
OROMUCOSAL | Status: AC
  Filled 2023-07-05: qty 15

## 2023-07-05 MED ORDER — EPHEDRINE 5 MG/ML INJ
INTRAVENOUS | Status: AC
  Filled 2023-07-05: qty 5

## 2023-07-05 MED ORDER — SODIUM CHLORIDE 0.9 % IR SOLN
Status: DC | PRN
  Administered 2023-07-05: 3000 mL

## 2023-07-05 MED ORDER — ACETAMINOPHEN 10 MG/ML IV SOLN: 1000.0000 mg | Freq: Once | INTRAVENOUS | Status: DC | PRN

## 2023-07-05 MED ORDER — ONDANSETRON HCL 4 MG/2ML IJ SOLN: 4.0000 mg | Freq: Once | INTRAMUSCULAR | Status: DC | PRN

## 2023-07-05 MED ORDER — ORAL CARE MOUTH RINSE: 15.0000 mL | Freq: Once | OROMUCOSAL | Status: AC

## 2023-07-05 MED ORDER — PHENYLEPHRINE 80 MCG/ML (10ML) SYRINGE FOR IV PUSH (FOR BLOOD PRESSURE SUPPORT)
PREFILLED_SYRINGE | INTRAVENOUS | Status: DC | PRN
  Administered 2023-07-05: 160 ug via INTRAVENOUS
  Administered 2023-07-05: 80 ug via INTRAVENOUS

## 2023-07-05 MED ORDER — DEXAMETHASONE SODIUM PHOSPHATE 10 MG/ML IJ SOLN
INTRAMUSCULAR | Status: DC | PRN
  Administered 2023-07-05: 10 mg via INTRAVENOUS

## 2023-07-05 MED ORDER — DEXMEDETOMIDINE HCL IN NACL 80 MCG/20ML IV SOLN
INTRAVENOUS | Status: DC | PRN
  Administered 2023-07-05: 8 ug via INTRAVENOUS
  Administered 2023-07-05: 12 ug via INTRAVENOUS

## 2023-07-05 MED ORDER — EPHEDRINE SULFATE-NACL 50-0.9 MG/10ML-% IV SOSY
PREFILLED_SYRINGE | INTRAVENOUS | Status: DC | PRN
  Administered 2023-07-05: 10 mg via INTRAVENOUS
  Administered 2023-07-05: 5 mg via INTRAVENOUS

## 2023-07-05 MED ORDER — PROPOFOL 500 MG/50ML IV EMUL
INTRAVENOUS | Status: DC | PRN
  Administered 2023-07-05: 140 ug/kg/min via INTRAVENOUS

## 2023-07-05 MED ORDER — IOHEXOL 180 MG/ML  SOLN
INTRAMUSCULAR | Status: DC | PRN
  Administered 2023-07-05: 10 mL

## 2023-07-05 MED ORDER — ESMOLOL HCL 100 MG/10ML IV SOLN
INTRAVENOUS | Status: DC | PRN
  Administered 2023-07-05: 30 mg via INTRAVENOUS
  Administered 2023-07-05: 20 mg via INTRAVENOUS

## 2023-07-05 MED ORDER — ONDANSETRON HCL 4 MG/2ML IJ SOLN
INTRAMUSCULAR | Status: DC | PRN
  Administered 2023-07-05: 4 mg via INTRAVENOUS

## 2023-07-05 MED ORDER — OXYCODONE HCL 5 MG PO TABS: 5.0000 mg | ORAL_TABLET | Freq: Once | ORAL | Status: DC | PRN

## 2023-07-05 MED ORDER — HYDROCODONE-ACETAMINOPHEN 5-325 MG PO TABS: 1.0000 | ORAL_TABLET | Freq: Four times a day (QID) | ORAL | 0 refills | Status: DC | PRN

## 2023-07-05 MED ORDER — CHLORHEXIDINE GLUCONATE 0.12 % MT SOLN
15.0000 mL | Freq: Once | OROMUCOSAL | Status: AC
  Administered 2023-07-05: 15 mL via OROMUCOSAL

## 2023-07-05 MED ORDER — OXYCODONE HCL 5 MG/5ML PO SOLN: 5.0000 mg | Freq: Once | ORAL | Status: DC | PRN

## 2023-07-05 MED ORDER — MIDAZOLAM HCL 2 MG/2ML IJ SOLN
INTRAMUSCULAR | Status: DC | PRN
  Administered 2023-07-05: 2 mg via INTRAVENOUS

## 2023-07-05 MED ORDER — FENTANYL CITRATE (PF) 100 MCG/2ML IJ SOLN: 25.0000 ug | INTRAMUSCULAR | Status: DC | PRN

## 2023-07-05 MED ORDER — SODIUM CHLORIDE 0.9 % IV SOLN
1.0000 g | INTRAVENOUS | Status: AC
  Administered 2023-07-05: 1 g via INTRAVENOUS
  Filled 2023-07-05 (×2): qty 10

## 2023-07-05 MED ORDER — LACTATED RINGERS IV SOLN: INTRAVENOUS | Status: AC

## 2023-07-05 SURGICAL SUPPLY — 24 items
BAG DRAIN SIEMENS DORNER NS (MISCELLANEOUS) ×1 IMPLANT
BASKET LASER NITINOL 1.9FR (BASKET) IMPLANT
BASKET ZERO TIP 1.9FR (BASKET) IMPLANT
BRUSH SCRUB EZ 1% IODOPHOR (MISCELLANEOUS) ×1 IMPLANT
BRUSH SCRUB EZ 4% CHG (MISCELLANEOUS) IMPLANT
CATH URET FLEX-TIP 2 LUMEN 10F (CATHETERS) IMPLANT
CATH URETL OPEN END 6X70 (CATHETERS) IMPLANT
CNTNR URN SCR LID CUP LEK RST (MISCELLANEOUS) IMPLANT
DRAPE UTILITY 15X26 TOWEL STRL (DRAPES) ×1 IMPLANT
FIBER LASER MOSES 200 DFL (Laser) IMPLANT
GLOVE BIOGEL PI IND STRL 7.5 (GLOVE) ×1 IMPLANT
GOWN STRL REUS W/ TWL LRG LVL3 (GOWN DISPOSABLE) ×1 IMPLANT
GOWN STRL REUS W/ TWL XL LVL3 (GOWN DISPOSABLE) ×1 IMPLANT
GUIDEWIRE STR DUAL SENSOR (WIRE) ×1 IMPLANT
IV NS IRRIG 3000ML ARTHROMATIC (IV SOLUTION) ×1 IMPLANT
KIT TURNOVER CYSTO (KITS) ×1 IMPLANT
PACK CYSTO AR (MISCELLANEOUS) ×1 IMPLANT
SET CYSTO W/LG BORE CLAMP LF (SET/KITS/TRAYS/PACK) ×1 IMPLANT
SHEATH NAVIGATOR HD 12/14X36 (SHEATH) IMPLANT
STENT URET 6FRX24 CONTOUR (STENTS) IMPLANT
STENT URET 6FRX26 CONTOUR (STENTS) IMPLANT
SURGILUBE 2OZ TUBE FLIPTOP (MISCELLANEOUS) ×1 IMPLANT
VALVE UROSEAL ADJ ENDO (VALVE) IMPLANT
WATER STERILE IRR 500ML POUR (IV SOLUTION) ×1 IMPLANT

## 2023-07-05 NOTE — Interval H&P Note (Signed)
History and Physical Interval Note:   07/05/2023 11:21 AM  Claire Day  has presented today for surgery, with the diagnosis of Left Ureteral Stone.  The various methods of treatment have been discussed with the patient and family. After consideration of risks, benefits and other options for treatment, the patient has consented to  Procedure(s): CYSTOSCOPY/URETEROSCOPY/HOLMIUM LASER/STENT EXCHANGE (Left) as a surgical intervention.  The patient's history has been reviewed, patient examined, no change in status, stable for surgery.  I have reviewed the patient's chart and labs.  Questions were answered to the patient's satisfaction.    Status post left ureteral stent placement by Dr. Richardo Hanks 06/10/2023.  She presents today for definitive stone placement with left ureteroscopy, laser lithotripsy/stone removal.  CT was done at an outside facility.  She does have left renal calculi present however due to overlying stool and bowel gas on KUB difficult to ascertain stone volume.  Will attempt to clear the left kidney however we discussed based on stone burden she may require staged procedure.  All questions were answered and she desires to proceed.  CV: RRR  lungs: Clear   Oluwademilade Kellett C Daman Steffenhagen

## 2023-07-05 NOTE — Anesthesia Preprocedure Evaluation (Signed)
Anesthesia Evaluation  Patient identified by MRN, date of birth, ID band Patient awake    Reviewed: Allergy & Precautions, NPO status , Patient's Chart, lab work & pertinent test results  History of Anesthesia Complications Negative for: history of anesthetic complications  Airway Mallampati: II  TM Distance: >3 FB Neck ROM: full    Dental no notable dental hx.    Pulmonary COPD, Current Smoker and Patient abstained from smoking. Right upper lobe pulmonary nodule   Pulmonary exam normal        Cardiovascular hypertension, + Past MI and +CHF  Normal cardiovascular exam+ Cardiac Defibrillator + Valvular Problems/Murmurs MR and AI   ECHO 01/2023 CONCLUSIONS   1. Left ventricle  The left ventricular cavity size is normal. Wall     thickness is normal. Systolic function is normal by the biplane method of     disks. The ejection fraction is 63%  +--5% . No segmental wall motion     abnormalities. Inconclusive diastolic evaluation due to conflicting data.  2. Atrial septum  There is no evidence of intracardiac shunt by color     Doppler or following injection of agitated saline with maneuvers.  3. Aortic valve  There is mild to moderate aortic regurgitation.  4. Tricuspid valve  There is mild tricuspid regurgitation.  5. Pulmonary arteries  Pulmonary artery estimated peak systolic pressure     30mm Hg. This is consistent with normal pulmonary artery pressure.     Neuro/Psych TIA negative psych ROS   GI/Hepatic negative GI ROS, Neg liver ROS,,,  Endo/Other  negative endocrine ROS    Renal/GU Renal disease  negative genitourinary   Musculoskeletal   Abdominal   Peds  Hematology negative hematology ROS (+)   Anesthesia Other Findings Past Medical History: No date: Allergic rhinitis No date: Anemia No date: Aortic insufficiency 02/12/2023: Cerebral aneurysm     Comment:  a.) CT head 02/12/2023:  3 x 3 mm distal LEFT  M1 MCA               just proximal to the bifurcation No date: Chronic systolic heart failure (HCC) No date: CKD (chronic kidney disease), stage III (HCC) No date: COPD (chronic obstructive pulmonary disease) (HCC) No date: History of kidney stones 2003: HIV (human immunodeficiency virus infection) (HCC)     Comment:  a.) Dx'd in 2003; b.) currently Tx'd with               darunavir-cobicistat + rilpivirine No date: HLD (hyperlipidemia) No date: Hydronephrosis No date: Hypertension No date: LBBB (left bundle branch block) No date: MI (myocardial infarction) (HCC) No date: Moderate mitral regurgitation No date: Nephrolithiasis No date: NICM (nonischemic cardiomyopathy) (HCC) No date: Pneumonia due to Streptococcus (HCC) 09/20/2017: Presence of cardiac resynchronization therapy  defibrillator (CRT-D)     Comment:  a.) Medtronic device placed 09/20/2017 --> [facility] MRI               CRT-D QUAD DF-4 (SN: [id number]) 02/12/2023: Right upper lobe pulmonary nodule     Comment:  a.) CT imaging 02/12/2023:  9 x 7 mm No date: 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 No date: Thrombocytopenia (HCC) 02/12/2023: TIA (transient ischemic attack) No date: Tobacco abuse  Past Surgical History: 09/20/2017: BI-VENTRICULAR IMPLANTABLE CARDIOVERTER DEFIBRILLATOR   (CRT-D); Left     Comment:  Procedure: BI-VENTRICULAR IMPLANTABLE CARDIOVERTER               DEFIBRILLATOR (CRT-D); Location: [facility]; Surgeon: Dorinda Hill  Hegland, MD No date: CHOLECYSTECTOMY 06/10/2023: CYSTOSCOPY W/ URETERAL STENT PLACEMENT; Left     Comment:  Procedure: CYSTOSCOPY WITH RETROGRADE PYELOGRAM/URETERAL              STENT PLACEMENT;  Surgeon: Sondra Come, MD;                Location: [facility];  Service: Urology;  Laterality: Left;     Reproductive/Obstetrics negative OB ROS                              Anesthesia Physical Anesthesia Plan  ASA: 4  Anesthesia Plan:  General   Post-op Pain Management: Ofirmev IV (intra-op)*   Induction: Intravenous  PONV Risk Score and Plan: 2 and Propofol infusion and TIVA  Airway Management Planned: Natural Airway and Nasal Cannula  Additional Equipment:   Intra-op Plan:   Post-operative Plan:   Informed Consent: I have reviewed the patients History and Physical, chart, labs and discussed the procedure including the risks, benefits and alternatives for the proposed anesthesia with the patient or authorized representative who has indicated his/her understanding and acceptance.     Dental Advisory Given  Plan Discussed with: Anesthesiologist, CRNA and Surgeon  Anesthesia Plan Comments: (Patient consented for risks of anesthesia including but not limited to:  - adverse reactions to medications - damage to eyes, teeth, lips or other oral mucosa - nerve damage due to positioning  - sore throat or hoarseness - Damage to heart, brain, nerves, lungs, other parts of body or loss of life  Patient voiced understanding and assent.)         Anesthesia Quick Evaluation

## 2023-07-05 NOTE — Discharge Instructions (Addendum)
DISCHARGE INSTRUCTIONS FOR KIDNEY STONE/URETERAL STENT   MEDICATIONS:  1. Resume all your other meds from home.  2.  AZO (over-the-counter) can help with the burning/stinging when you urinate. 3.  Hydrocodone is for moderate/severe pain, Rx was sent to your pharmacy.  ACTIVITY:  1. May resume regular activities in 24 hours. 2. No driving while on narcotic pain medications  3. Drink plenty of water  4. Continue to walk at home - you can still get blood clots when you are at home, so keep active, but don't over do it.  5. May return to work/school tomorrow or when you feel ready    SIGNS/SYMPTOMS TO CALL:  Common postoperative symptoms include urinary frequency, urgency, bladder spasm and blood in the urine  Please call us if you have a fever greater than 101.5, uncontrolled nausea/vomiting, uncontrolled pain, dizziness, unable to urinate, excessively bloody urine, chest pain, shortness of breath, leg swelling, leg pain, or any other concerns or questions.   You can reach [place] at [phone].   FOLLOW-UP:  1. You will be contacted for an appointment to obtain a follow-up x-ray.  You may possibly need a second look procedure depending on the x-ray results.

## 2023-07-05 NOTE — Anesthesia Postprocedure Evaluation (Signed)
Anesthesia Post Note  Patient: Claire Day  Procedure(s) Performed: CYSTOSCOPY/URETEROSCOPY/HOLMIUM LASER/STENT EXCHANGE (Left)  Patient location during evaluation: PACU Anesthesia Type: General Level of consciousness: awake and alert Pain management: pain level controlled Vital Signs Assessment: post-procedure vital signs reviewed and stable Respiratory status: spontaneous breathing, nonlabored ventilation, respiratory function stable and patient connected to nasal cannula oxygen Cardiovascular status: blood pressure returned to baseline and stable Postop Assessment: no apparent nausea or vomiting Anesthetic complications: no   There were no known notable events for this encounter.   Last Vitals:  Vitals:   07/05/23 1345 07/05/23 1402  BP: (!) 143/90 (!) 164/84  Pulse: 88 87  Resp: 13 (!) 27  Temp:    SpO2: 95% 97%    Last Pain:  Vitals:   07/05/23 1345  PainSc: 0-No pain                 Cleda Mccreedy Josuha Fontanez

## 2023-07-05 NOTE — Transfer of Care (Signed)
Immediate Anesthesia Transfer of Care Note  Patient: Claire Day  Procedure(s) Performed: CYSTOSCOPY/URETEROSCOPY/HOLMIUM LASER/STENT EXCHANGE (Left)  Patient Location: PACU  Anesthesia Type:General  Level of Consciousness: drowsy  Airway & Oxygen Therapy: Patient Spontanous Breathing and Patient connected to face mask oxygen  Post-op Assessment: Report given to RN and Post -op Vital signs reviewed and stable  Post vital signs: stable semi-sitting position, 4L via facemask  Last Vitals:  Vitals Value Taken Time  BP 131/86 07/05/23 1315  Temp 36.3 C 07/05/23 1315  Pulse 71 07/05/23 1320  Resp 13 07/05/23 1320  SpO2 100 % 07/05/23 1320  Vitals shown include unfiled device data.  Last Pain:  Vitals:   07/05/23 1005  PainSc: 0-No pain         Complications: No notable events documented.

## 2023-07-06 ENCOUNTER — Encounter: Payer: Self-pay | Admitting: Urology

## 2023-07-08 NOTE — Op Note (Signed)
Preoperative diagnosis:  Left ureteral calculus Left nephrolithiasis  Postoperative diagnosis:  Left ureteral calculi Left nephrolithiasis  Procedure:  Cystoscopy Left ureteroscopy and stone removal Ureteroscopic laser lithotripsy Left ureteral stent exchange (66F/24 cm) Left retrograde pyelography with interpretation  Surgeon: Lorin Picket C. Milt Coye, M.D.  Anesthesia: General  Complications: None  Intraoperative findings:  Cystoscopy: Inflammatory changes left hemitrigone secondary to indwelling stent.  No solid or papillary lesions Ureteropyeloscopy: No ureteral abnormalities or mucosal lesions identified.  The following stones were identified: 4 mm calculus left distal ureter; fragmented 0.3J/40 Hz (200 m Moses holmium laser fiber) fragments removed with 1.9 [place] basket 6 mm left mid ureteral calculus; dusted with holmium laser; larger fragments removed with 1.9 [place] nitinol basket 2 small proximal ureteral calculi measuring ~ 3 mm; removed with 1.23F basket 9 mm and 11 mm mid calyceal calculi dusted at 0.3J/120 Hz Retrograde pyelography post procedure showed no filling defects, stone fragments or contrast extravasation  EBL: Minimal  Specimens: Calculus fragments for analysis   Indication: Claire Day is a 57 y.o. female with a history of an obstructing 6 mm left ureteral calculus and infection status post stent placement by Dr. Richardo Hanks.  She presents today for definitive stone management.  Her CT was performed at an outside hospital and not available for review.  KUB was suboptimal with large amount stool and bowel gas however there were calculi overlying the left renal outline.  After reviewing the management options for treatment, the patient elected to proceed with the above surgical procedure(s). We have discussed the potential benefits and risks of the procedure, side effects of the proposed treatment, the likelihood of the patient achieving the goals of the  procedure, and any potential problems that might occur during the procedure or recuperation. Informed consent has been obtained.  Description of procedure:  The patient was taken to the operating room and general anesthesia was induced.  The patient was placed in the dorsal lithotomy position, prepped and draped in the usual sterile fashion, and preoperative antibiotics were administered. A preoperative time-out was performed.   A 21 French cystoscope was lubricated and placed per urethra.  Panendoscopy was performed with findings as described above.  Attention was directed to the left ureteral orifice and the indwelling stent was grasped with endoscopic forceps and brought out through the urethral meatus.  A 0.038 Sensor wire was then placed through the stent and advanced into the renal pelvis on fluoroscopic guidance followed by stent removal.   A 4.5 Fr semirigid ureteroscope was then advanced into the ureter next to the guidewire.  The distal and mid ureteral calculi were identified and treated as described above.  The semirigid ureteroscope was removed and a single channel digital flexible ureteroscope was placed per urethra and advanced into the left ureter alongside the guidewire.  The proximal ureteral calculi were then removed with the basket as described above.  Pyeloscopy was then performed and the identified stones were treated as described above.    Retrograde pyelogram was performed with findings as described above.  All calyces were then examined under fluoroscopic guidance.  Large amount of dust noted in upper and middle calyces but no significantly sized fragments were identified.  A 6 FR/ 24 CM Contour ureteral stent was placed under fluoroscopic guidance.  The wire was then removed with an adequate stent curl noted in the renal pelvis as well as in the bladder.  The bladder was then emptied and the procedure ended.  The patient appeared to  tolerate the procedure well and without  complications.  After anesthetic reversal the patient was transported to the PACU in stable condition.   Plan: KUB will performed in approximately 2 weeks; possible need for second look ureteroscopy based on KUB findings was discussed If no significant fragments identified cystoscopy with stent removal in office approximately 10 days   Irineo Axon, MD  Irineo Axon, MD

## 2023-07-19 LAB — CALCULI, WITH PHOTOGRAPH (CLINICAL LAB)
Calcium Oxalate Monohydrate: 50 %
Weight Calculi: 65 mg

## 2023-08-03 ENCOUNTER — Encounter: Payer: Self-pay | Admitting: Urology

## 2023-08-15 ENCOUNTER — Other Ambulatory Visit: Payer: Self-pay

## 2023-08-15 ENCOUNTER — Ambulatory Visit
Admission: RE | Admit: 2023-08-15 | Discharge: 2023-08-15 | Disposition: A | Discharge status: Home / Self Care | Payer: Medicare HMO | Source: Ambulatory Visit | Attending: Urology | Admitting: Urology

## 2023-08-15 DIAGNOSIS — N201 Calculus of ureter: Secondary | ICD-10-CM | POA: Insufficient documentation

## 2023-08-18 ENCOUNTER — Ambulatory Visit (INDEPENDENT_AMBULATORY_CARE_PROVIDER_SITE_OTHER): Payer: Medicare HMO | Admitting: Urology

## 2023-08-18 VITALS — BP 150/99 | HR 102

## 2023-08-18 DIAGNOSIS — Z466 Encounter for fitting and adjustment of urinary device: Secondary | ICD-10-CM | POA: Diagnosis not present

## 2023-08-18 DIAGNOSIS — N200 Calculus of kidney: Secondary | ICD-10-CM

## 2023-08-18 LAB — URINALYSIS, COMPLETE
Bilirubin, UA: NEGATIVE
Glucose, UA: NEGATIVE
Ketones, UA: NEGATIVE
Nitrite, UA: NEGATIVE
Specific Gravity, UA: 1.02 (ref 1.005–1.030)
Urobilinogen, Ur: 4 mg/dL — ABNORMAL HIGH (ref 0.2–1.0)
pH, UA: 7 (ref 5.0–7.5)

## 2023-08-18 LAB — MICROSCOPIC EXAMINATION
RBC, Urine: >30 /[HPF] — AB (ref 0–2)
WBC, UA: >30 /[HPF] — AB (ref 0–5)

## 2023-08-18 MED ORDER — CEPHALEXIN 250 MG PO CAPS
500.0000 mg | ORAL_CAPSULE | Freq: Once | ORAL | Status: AC
  Administered 2023-08-18: 500 mg via ORAL

## 2023-08-18 NOTE — Progress Notes (Signed)
   Indications: Patient is 58 y.o., who is s/p ureteroscopic removal of a left ureteral/renal calculi 07/05/2023.  She had no postoperative problems.  KUB performed earlier this week showed no significant stone fragments.  The patient is presenting today for stent removal.  Procedure:  Flexible Cystoscopy with stent removal (42595)  Timeout was performed and the correct patient, procedure and participants were identified.    Description:  The patient was prepped and draped in the usual sterile fashion. Flexible cystosopy was performed.  The stent was visualized, grasped, and removed intact without difficulty. The patient tolerated the procedure well.  A single dose of oral antibiotics was given.  Complications:  None  Plan:  Call for flank pain post stent removal 80-month follow-up with KUB Stone analysis 50% calcium oxalate monohydrate/50% atazanvir.***

## 2023-08-19 ENCOUNTER — Encounter: Payer: Self-pay | Admitting: Urology

## 2023-12-02 ENCOUNTER — Other Ambulatory Visit: Payer: Self-pay

## 2023-12-02 ENCOUNTER — Emergency Department (HOSPITAL_COMMUNITY)

## 2023-12-02 ENCOUNTER — Encounter (HOSPITAL_COMMUNITY): Payer: Self-pay | Admitting: *Deleted

## 2023-12-02 ENCOUNTER — Inpatient Hospital Stay (HOSPITAL_COMMUNITY)
Admission: EM | Admit: 2023-12-02 | Discharge: 2023-12-05 | DRG: 189 | Disposition: A | Discharge status: Home / Self Care | Attending: Internal Medicine | Admitting: Internal Medicine

## 2023-12-02 DIAGNOSIS — J441 Chronic obstructive pulmonary disease with (acute) exacerbation: Secondary | ICD-10-CM | POA: Diagnosis present

## 2023-12-02 DIAGNOSIS — I130 Hypertensive heart and chronic kidney disease with heart failure and stage 1 through stage 4 chronic kidney disease, or unspecified chronic kidney disease: Secondary | ICD-10-CM | POA: Diagnosis present

## 2023-12-02 DIAGNOSIS — E785 Hyperlipidemia, unspecified: Secondary | ICD-10-CM | POA: Diagnosis present

## 2023-12-02 DIAGNOSIS — F17200 Nicotine dependence, unspecified, uncomplicated: Secondary | ICD-10-CM | POA: Diagnosis not present

## 2023-12-02 DIAGNOSIS — R918 Other nonspecific abnormal finding of lung field: Secondary | ICD-10-CM | POA: Diagnosis present

## 2023-12-02 DIAGNOSIS — R7989 Other specified abnormal findings of blood chemistry: Secondary | ICD-10-CM | POA: Diagnosis present

## 2023-12-02 DIAGNOSIS — Z8673 Personal history of transient ischemic attack (TIA), and cerebral infarction without residual deficits: Secondary | ICD-10-CM

## 2023-12-02 DIAGNOSIS — Z681 Body mass index (BMI) 19 or less, adult: Secondary | ICD-10-CM | POA: Diagnosis not present

## 2023-12-02 DIAGNOSIS — I340 Nonrheumatic mitral (valve) insufficiency: Secondary | ICD-10-CM | POA: Diagnosis present

## 2023-12-02 DIAGNOSIS — I671 Cerebral aneurysm, nonruptured: Secondary | ICD-10-CM | POA: Diagnosis present

## 2023-12-02 DIAGNOSIS — I10 Essential (primary) hypertension: Secondary | ICD-10-CM | POA: Diagnosis present

## 2023-12-02 DIAGNOSIS — J9601 Acute respiratory failure with hypoxia: Secondary | ICD-10-CM | POA: Diagnosis present

## 2023-12-02 DIAGNOSIS — J4551 Severe persistent asthma with (acute) exacerbation: Principal | ICD-10-CM

## 2023-12-02 DIAGNOSIS — F17210 Nicotine dependence, cigarettes, uncomplicated: Secondary | ICD-10-CM | POA: Diagnosis present

## 2023-12-02 DIAGNOSIS — M549 Dorsalgia, unspecified: Secondary | ICD-10-CM | POA: Diagnosis present

## 2023-12-02 DIAGNOSIS — R54 Age-related physical debility: Secondary | ICD-10-CM | POA: Diagnosis present

## 2023-12-02 DIAGNOSIS — I2489 Other forms of acute ischemic heart disease: Secondary | ICD-10-CM | POA: Diagnosis present

## 2023-12-02 DIAGNOSIS — R0602 Shortness of breath: Secondary | ICD-10-CM | POA: Diagnosis present

## 2023-12-02 DIAGNOSIS — B20 Human immunodeficiency virus [HIV] disease: Secondary | ICD-10-CM | POA: Diagnosis present

## 2023-12-02 DIAGNOSIS — I428 Other cardiomyopathies: Secondary | ICD-10-CM | POA: Diagnosis present

## 2023-12-02 DIAGNOSIS — N1832 Chronic kidney disease, stage 3b: Secondary | ICD-10-CM | POA: Diagnosis present

## 2023-12-02 DIAGNOSIS — Z716 Tobacco abuse counseling: Secondary | ICD-10-CM

## 2023-12-02 DIAGNOSIS — I5022 Chronic systolic (congestive) heart failure: Secondary | ICD-10-CM | POA: Diagnosis present

## 2023-12-02 DIAGNOSIS — D72829 Elevated white blood cell count, unspecified: Secondary | ICD-10-CM | POA: Diagnosis present

## 2023-12-02 DIAGNOSIS — Z1152 Encounter for screening for COVID-19: Secondary | ICD-10-CM | POA: Diagnosis not present

## 2023-12-02 DIAGNOSIS — Z95810 Presence of automatic (implantable) cardiac defibrillator: Secondary | ICD-10-CM

## 2023-12-02 DIAGNOSIS — Z87442 Personal history of urinary calculi: Secondary | ICD-10-CM

## 2023-12-02 DIAGNOSIS — D696 Thrombocytopenia, unspecified: Secondary | ICD-10-CM | POA: Diagnosis present

## 2023-12-02 DIAGNOSIS — Z8701 Personal history of pneumonia (recurrent): Secondary | ICD-10-CM

## 2023-12-02 DIAGNOSIS — Z885 Allergy status to narcotic agent status: Secondary | ICD-10-CM

## 2023-12-02 DIAGNOSIS — Z887 Allergy status to serum and vaccine status: Secondary | ICD-10-CM

## 2023-12-02 DIAGNOSIS — I252 Old myocardial infarction: Secondary | ICD-10-CM

## 2023-12-02 DIAGNOSIS — Z79899 Other long term (current) drug therapy: Secondary | ICD-10-CM

## 2023-12-02 DIAGNOSIS — Z86008 Personal history of in-situ neoplasm of other site: Secondary | ICD-10-CM

## 2023-12-02 DIAGNOSIS — I351 Nonrheumatic aortic (valve) insufficiency: Secondary | ICD-10-CM | POA: Diagnosis present

## 2023-12-02 DIAGNOSIS — Z9049 Acquired absence of other specified parts of digestive tract: Secondary | ICD-10-CM

## 2023-12-02 DIAGNOSIS — D751 Secondary polycythemia: Secondary | ICD-10-CM | POA: Diagnosis present

## 2023-12-02 DIAGNOSIS — Z21 Asymptomatic human immunodeficiency virus [HIV] infection status: Secondary | ICD-10-CM | POA: Diagnosis present

## 2023-12-02 DIAGNOSIS — E43 Unspecified severe protein-calorie malnutrition: Secondary | ICD-10-CM | POA: Diagnosis present

## 2023-12-02 LAB — COMPREHENSIVE METABOLIC PANEL WITH GFR
ALT: 10 U/L (ref 0–44)
AST: 20 U/L (ref 15–41)
Albumin: 3.8 g/dL (ref 3.5–5.0)
Alkaline Phosphatase: 144 U/L — ABNORMAL HIGH (ref 38–126)
Anion gap: 12 (ref 5–15)
BUN: 17 mg/dL (ref 6–20)
CO2: 24 mmol/L (ref 22–32)
Calcium: 9.2 mg/dL (ref 8.9–10.3)
Chloride: 103 mmol/L (ref 98–111)
Creatinine, Ser: 1.4 mg/dL — ABNORMAL HIGH (ref 0.44–1.00)
GFR, Estimated: 44 mL/min — ABNORMAL LOW (ref >60)
Glucose, Bld: 133 mg/dL — ABNORMAL HIGH (ref 70–99)
Potassium: 3.8 mmol/L (ref 3.5–5.1)
Sodium: 139 mmol/L (ref 135–145)
Total Bilirubin: 0.8 mg/dL (ref 0.0–1.2)
Total Protein: 6.9 g/dL (ref 6.5–8.1)

## 2023-12-02 LAB — CBC WITH DIFFERENTIAL/PLATELET
Abs Immature Granulocytes: 0.04 10*3/uL (ref 0.00–0.07)
Basophils Absolute: 0 10*3/uL (ref 0.0–0.1)
Basophils Relative: 0 %
Eosinophils Absolute: 0 10*3/uL (ref 0.0–0.5)
Eosinophils Relative: 0 %
HCT: 51 % — ABNORMAL HIGH (ref 36.0–46.0)
Hemoglobin: 16.8 g/dL — ABNORMAL HIGH (ref 12.0–15.0)
Immature Granulocytes: 0 %
Lymphocytes Relative: 15 %
Lymphs Abs: 1.5 10*3/uL (ref 0.7–4.0)
MCH: 29.7 pg (ref 26.0–34.0)
MCHC: 32.9 g/dL (ref 30.0–36.0)
MCV: 90.3 fL (ref 80.0–100.0)
Monocytes Absolute: 0.7 10*3/uL (ref 0.1–1.0)
Monocytes Relative: 7 %
Neutro Abs: 7.8 10*3/uL — ABNORMAL HIGH (ref 1.7–7.7)
Neutrophils Relative %: 78 %
Platelets: 113 10*3/uL — ABNORMAL LOW (ref 150–400)
RBC: 5.65 MIL/uL — ABNORMAL HIGH (ref 3.87–5.11)
RDW: 14.2 % (ref 11.5–15.5)
WBC: 10.1 10*3/uL (ref 4.0–10.5)
nRBC: 0 % (ref 0.0–0.2)

## 2023-12-02 LAB — RESP PANEL BY RT-PCR (RSV, FLU A&B, COVID)  RVPGX2
Influenza A by PCR: NEGATIVE
Influenza B by PCR: NEGATIVE
Resp Syncytial Virus by PCR: NEGATIVE
SARS Coronavirus 2 by RT PCR: NEGATIVE

## 2023-12-02 LAB — BRAIN NATRIURETIC PEPTIDE: B Natriuretic Peptide: 105 pg/mL — ABNORMAL HIGH (ref 0.0–100.0)

## 2023-12-02 LAB — TROPONIN I (HIGH SENSITIVITY)
Troponin I (High Sensitivity): 33 ng/L — ABNORMAL HIGH (ref <18)
Troponin I (High Sensitivity): 36 ng/L — ABNORMAL HIGH (ref <18)

## 2023-12-02 MED ORDER — IPRATROPIUM-ALBUTEROL 0.5-2.5 (3) MG/3ML IN SOLN
3.0000 mL | Freq: Once | RESPIRATORY_TRACT | Status: AC
  Administered 2023-12-02: 3 mL via RESPIRATORY_TRACT
  Filled 2023-12-02: qty 3

## 2023-12-02 MED ORDER — ALBUTEROL SULFATE (2.5 MG/3ML) 0.083% IN NEBU: 2.5000 mg | INHALATION_SOLUTION | RESPIRATORY_TRACT | Status: DC | PRN

## 2023-12-02 MED ORDER — IPRATROPIUM-ALBUTEROL 0.5-2.5 (3) MG/3ML IN SOLN
3.0000 mL | Freq: Three times a day (TID) | RESPIRATORY_TRACT | Status: DC
  Administered 2023-12-03 – 2023-12-05 (×7): 3 mL via RESPIRATORY_TRACT
  Filled 2023-12-02 (×7): qty 3

## 2023-12-02 MED ORDER — HYDROCODONE-ACETAMINOPHEN 5-325 MG PO TABS
1.0000 | ORAL_TABLET | Freq: Four times a day (QID) | ORAL | Status: DC | PRN
  Administered 2023-12-02 – 2023-12-03 (×2): 1 via ORAL
  Filled 2023-12-02 (×2): qty 1

## 2023-12-02 MED ORDER — ALBUTEROL SULFATE (2.5 MG/3ML) 0.083% IN NEBU
10.0000 mg | INHALATION_SOLUTION | Freq: Once | RESPIRATORY_TRACT | Status: AC
  Administered 2023-12-02: 10 mg via RESPIRATORY_TRACT
  Filled 2023-12-02: qty 12

## 2023-12-02 MED ORDER — MORPHINE SULFATE (PF) 4 MG/ML IV SOLN
4.0000 mg | Freq: Once | INTRAVENOUS | Status: AC
  Administered 2023-12-02: 4 mg via INTRAVENOUS
  Filled 2023-12-02: qty 1

## 2023-12-02 MED ORDER — ONDANSETRON HCL 4 MG/2ML IJ SOLN
4.0000 mg | Freq: Once | INTRAMUSCULAR | Status: AC
  Administered 2023-12-02: 4 mg via INTRAVENOUS
  Filled 2023-12-02: qty 2

## 2023-12-02 MED ORDER — SODIUM CHLORIDE 0.9 % IV SOLN
1.0000 g | INTRAVENOUS | Status: DC
  Administered 2023-12-02 – 2023-12-04 (×3): 1 g via INTRAVENOUS
  Filled 2023-12-02 (×3): qty 10

## 2023-12-02 MED ORDER — IPRATROPIUM-ALBUTEROL 0.5-2.5 (3) MG/3ML IN SOLN
3.0000 mL | RESPIRATORY_TRACT | Status: DC
  Administered 2023-12-02 (×2): 3 mL via RESPIRATORY_TRACT
  Filled 2023-12-02 (×2): qty 3

## 2023-12-02 MED ORDER — LOSARTAN POTASSIUM 50 MG PO TABS
25.0000 mg | ORAL_TABLET | Freq: Every day | ORAL | Status: DC
  Administered 2023-12-02 – 2023-12-05 (×4): 25 mg via ORAL
  Filled 2023-12-02 (×4): qty 1

## 2023-12-02 MED ORDER — CARVEDILOL 3.125 MG PO TABS
6.2500 mg | ORAL_TABLET | Freq: Two times a day (BID) | ORAL | Status: DC
  Administered 2023-12-02 – 2023-12-03 (×3): 6.25 mg via ORAL
  Filled 2023-12-02 (×3): qty 2

## 2023-12-02 MED ORDER — ENOXAPARIN SODIUM 40 MG/0.4ML IJ SOSY
40.0000 mg | PREFILLED_SYRINGE | INTRAMUSCULAR | Status: DC
  Administered 2023-12-03 – 2023-12-04 (×2): 40 mg via SUBCUTANEOUS
  Filled 2023-12-02 (×2): qty 0.4

## 2023-12-02 MED ORDER — ARFORMOTEROL TARTRATE 15 MCG/2ML IN NEBU
15.0000 ug | INHALATION_SOLUTION | Freq: Two times a day (BID) | RESPIRATORY_TRACT | Status: DC
  Administered 2023-12-02 – 2023-12-05 (×6): 15 ug via RESPIRATORY_TRACT
  Filled 2023-12-02 (×6): qty 2

## 2023-12-02 MED ORDER — UMECLIDINIUM BROMIDE 62.5 MCG/ACT IN AEPB
1.0000 | INHALATION_SPRAY | Freq: Every day | RESPIRATORY_TRACT | Status: DC
  Administered 2023-12-03 – 2023-12-05 (×3): 1 via RESPIRATORY_TRACT
  Filled 2023-12-02: qty 7

## 2023-12-02 MED ORDER — PANTOPRAZOLE SODIUM 40 MG PO TBEC
40.0000 mg | DELAYED_RELEASE_TABLET | Freq: Every day | ORAL | Status: DC
  Administered 2023-12-02 – 2023-12-03 (×2): 40 mg via ORAL
  Filled 2023-12-02 (×2): qty 1

## 2023-12-02 MED ORDER — METHYLPREDNISOLONE SODIUM SUCC 125 MG IJ SOLR
125.0000 mg | Freq: Once | INTRAMUSCULAR | Status: AC
  Administered 2023-12-02: 125 mg via INTRAVENOUS
  Filled 2023-12-02: qty 2

## 2023-12-02 MED ORDER — IOHEXOL 350 MG/ML SOLN
60.0000 mL | Freq: Once | INTRAVENOUS | Status: AC | PRN
  Administered 2023-12-02: 60 mL via INTRAVENOUS

## 2023-12-02 MED ORDER — ASPIRIN 325 MG PO TABS
325.0000 mg | ORAL_TABLET | Freq: Once | ORAL | Status: AC
  Administered 2023-12-02: 325 mg via ORAL
  Filled 2023-12-02: qty 1

## 2023-12-02 MED ORDER — KETOROLAC TROMETHAMINE 15 MG/ML IJ SOLN
15.0000 mg | Freq: Once | INTRAMUSCULAR | Status: AC
  Administered 2023-12-02: 15 mg via INTRAVENOUS
  Filled 2023-12-02: qty 1

## 2023-12-02 MED ORDER — SODIUM CHLORIDE 0.9 % IV BOLUS
1000.0000 mL | Freq: Once | INTRAVENOUS | Status: AC
  Administered 2023-12-02: 1000 mL via INTRAVENOUS

## 2023-12-02 MED ORDER — METHYLPREDNISOLONE SODIUM SUCC 125 MG IJ SOLR: 125.0000 mg | Freq: Every day | INTRAMUSCULAR | Status: DC

## 2023-12-02 MED ORDER — MAGNESIUM SULFATE 2 GM/50ML IV SOLN
2.0000 g | Freq: Once | INTRAVENOUS | Status: AC
  Administered 2023-12-02: 2 g via INTRAVENOUS
  Filled 2023-12-02: qty 50

## 2023-12-02 MED ORDER — DAPSONE 100 MG PO TABS
100.0000 mg | ORAL_TABLET | Freq: Every day | ORAL | Status: DC
  Administered 2023-12-02 – 2023-12-04 (×3): 100 mg via ORAL
  Filled 2023-12-02 (×4): qty 1

## 2023-12-02 MED ORDER — DARUNAVIR-COBICISTAT 800-150 MG PO TABS
1.0000 | ORAL_TABLET | Freq: Every day | ORAL | Status: DC
  Administered 2023-12-02 – 2023-12-04 (×3): 1 via ORAL
  Filled 2023-12-02 (×4): qty 1

## 2023-12-02 MED ORDER — NICOTINE 21 MG/24HR TD PT24
21.0000 mg | MEDICATED_PATCH | Freq: Every day | TRANSDERMAL | Status: DC
Start: 2023-12-02 — End: 2023-12-03
  Administered 2023-12-02 – 2023-12-03 (×2): 21 mg via TRANSDERMAL
  Filled 2023-12-02 (×2): qty 1

## 2023-12-02 NOTE — H&P (Signed)
 [facility] H&P   Patient Demographics:    Claire Day, is a 58 y.o. female  MRN: [id number]   DOB - 07/22/1966  Admit Date - 12/02/2023  Outpatient Primary MD for the patient is Tressie Fryer, MD  Referring MD/NP/PA: Dr. Martina Sledge  Outpatient Specialists: Cardiology and pulmonary and ID at [facility]  Patient coming from: Home  Chief Complaint  Patient presents with   Shortness of Breath      HPI:    Claire Day  is a 58 y.o. female, with past medical history of CKD, COPD, HIV, nonischemic cardiomyopathy, AICD, cerebral aneurysm. - Patient still smoking, she presents to the ED secondary to complaints of feeling unwell, cough, congestion, and dyspnea, reports some chest pain related to cough, and back discomfort, reports dyspnea worsened this morning, mainly with activity, cough remains nonproductive, denies fever, chills or sick contacts, so she came to ED for further evaluation - In ED she was hypoxic, with wheezing and increased work of breathing, so she was started on oxygen, CTA chest significant for multiple pulmonary nodules but no PE or pneumonia, no oxygen requirement and increased work of breathing.  Hospitalist consulted to admit.    Review of systems:      A full 10 point Review of Systems was done, except as stated above, all other Review of Systems were negative.   With Past History of the following :    Past Medical History:  Diagnosis Date   Allergic rhinitis    Anemia    Aortic insufficiency    Cerebral aneurysm 02/12/2023   a.) CT head 02/12/2023:  3 x 3 mm distal LEFT M1 MCA just proximal to the bifurcation   Chronic systolic heart failure (HCC)    CKD (chronic kidney disease), stage III (HCC)    COPD (chronic obstructive pulmonary disease) (HCC)    History of kidney stones    HIV (human immunodeficiency virus infection) (HCC) 2003   a.) Dx'd in 2003; b.)  currently Tx'd with darunavir-cobicistat + rilpivirine   HLD (hyperlipidemia)    Hydronephrosis    Hypertension    LBBB (left bundle branch block)    MI (myocardial infarction) (HCC)    Moderate mitral regurgitation    Nephrolithiasis    NICM (nonischemic cardiomyopathy) (HCC)    Pneumonia due to Streptococcus (HCC)    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   a.) Medtronic device placed 09/20/2017 --> CLARIA MRI CRT-D QUAD DF-4 (SN: [id number])   Right upper lobe pulmonary nodule 02/12/2023   a.) CT imaging 02/12/2023:  9 x 7 mm   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    Thrombocytopenia (HCC)    TIA (transient ischemic attack) 02/12/2023   Tobacco abuse       Past Surgical History:  Procedure Laterality Date   BI-VENTRICULAR IMPLANTABLE CARDIOVERTER DEFIBRILLATOR  (CRT-D) Left 09/20/2017   Procedure: BI-VENTRICULAR  IMPLANTABLE CARDIOVERTER DEFIBRILLATOR (CRT-D); Location: [facility]; Surgeon: Arvella Latina, MD   CHOLECYSTECTOMY     CYSTOSCOPY W/ URETERAL STENT PLACEMENT Left 06/10/2023   Procedure: CYSTOSCOPY WITH RETROGRADE PYELOGRAM/URETERAL STENT PLACEMENT;  Surgeon: Lawerence Pressman, MD;  Location: [facility];  Service: Urology;  Laterality: Left;   CYSTOSCOPY/URETEROSCOPY/HOLMIUM LASER/STENT PLACEMENT Left 07/05/2023   Procedure: CYSTOSCOPY/URETEROSCOPY/HOLMIUM LASER/STENT EXCHANGE;  Surgeon: Geraline Knapp, MD;  Location: [facility];  Service: Urology;  Laterality: Left;      Social History:     Social History   Tobacco Use   Smoking status: Every Day    Current packs/day: 0.50    Types: Cigarettes   Smokeless tobacco: Not on file  Substance Use Topics   Alcohol use: No        Family History :    History reviewed. No pertinent family history.    Home Medications:   Prior to Admission medications   Medication Sig Start Date End Date Taking? Authorizing Provider  predniSONE  (STERAPRED UNI-PAK 21 TAB) 10 MG (21)  TBPK tablet Take by mouth. 09/29/23  Yes [provider]  albuterol  (VENTOLIN  HFA) 108 (90 Base) MCG/ACT inhaler Inhale 2 puffs into the lungs every 4 (four) hours as needed for wheezing or shortness of breath. 11/14/21   Early Glisson, MD  amLODipine (NORVASC) 5 MG tablet Take 1 tablet every day by oral route. 02/04/20   [provider]  carvedilol (COREG) 6.25 MG tablet Take 1 tablet by mouth 2 (two) times daily.    [provider]  dapsone 100 MG tablet Take 100 mg by mouth at bedtime.    [provider]  darunavir-cobicistat (PREZCOBIX) 800-150 MG tablet Take 1 tablet by mouth at bedtime. Swallow whole. Do NOT crush, break or chew tablets. Take with food.    [provider]  HYDROcodone -acetaminophen  (NORCO/VICODIN) 5-325 MG tablet Take 1 tablet by mouth every 6 (six) hours as needed for moderate pain (pain score 4-6). 07/05/23   Stoioff, Kizzie Perks, MD  mirtazapine (REMERON SOL-TAB) 15 MG disintegrating tablet Take 15 mg by mouth at bedtime.    [provider]  naproxen  (NAPROSYN ) 500 MG tablet Take 1 tablet (500 mg total) by mouth 2 (two) times daily with a meal. Patient taking differently: Take 500 mg by mouth as needed. 08/21/20   Early Glisson, MD  predniSONE  (DELTASONE ) 10 MG tablet TAKE 6 TABLETS BY MOUTH ON DAY 1; THEN DECREASE BY 1 TABLET EACH DAY UNTIL ALL ARE TAKEN.    [provider]  rilpivirine (EDURANT) 25 MG TABS tablet Take 25 mg by mouth at bedtime.    [provider]  sacubitril-valsartan (ENTRESTO) 97-103 MG 1 [tbl] by oral route.    [provider]  Tiotropium Bromide-Olodaterol (STIOLTO RESPIMAT) 2.5-2.5 MCG/ACT AERS INHALE 2 PUFFS BY MOUTH EVERY DAY Inhalation for 30 Days    [provider]     Allergies:     Allergies  Allergen Reactions   Sulfonamide Derivatives Hives and Dermatitis   Tramadol Other (See Comments)    Unknown      Physical Exam:   Vitals  Blood pressure (!)  153/85, pulse (!) 111, temperature 97.7 F (36.5 C), temperature source Oral, resp. rate 16, height 5\' 8"  (1.727 m), weight 47.6 kg, SpO2 92%.   1. General frail, thin appearing, deconditioned, appears older than stated age  14. Normal affect and insight, Not Suicidal or Homicidal, Awake Alert, Oriented X 3.  3. No F.N deficits, ALL C.Nerves Intact, Strength  5/5 all 4 extremities, Sensation intact all 4 extremities, Plantars down going.  4. Ears and Eyes appear Normal, Conjunctivae clear, PERRLA. Moist Oral Mucosa.  5. Supple Neck, No JVD, No cervical lymphadenopathy appriciated, No Carotid Bruits.  6. Symmetrical Chest wall movement, diminished air entry with bilateral wheezing  7.  Cardiac, No Gallops, Rubs or Murmurs, No Parasternal Heave.  8. Positive Bowel Sounds, Abdomen Soft, No tenderness, No organomegaly appriciated,No rebound -guarding or rigidity.  9.  No Cyanosis, Normal Skin Turgor, No Skin Rash or Bruise.  10.  Significant muscle wasting,  joints appear normal , no effusions, Normal ROM.     Data Review:    CBC Recent Labs  Lab 12/02/23 0907  WBC 10.1  HGB 16.8*  HCT 51.0*  PLT 113*  MCV 90.3  MCH 29.7  MCHC 32.9  RDW 14.2  LYMPHSABS 1.5  MONOABS 0.7  EOSABS 0.0  BASOSABS 0.0   ------------------------------------------------------------------------------------------------------------------  Chemistries  Recent Labs  Lab 12/02/23 0907  NA 139  K 3.8  CL 103  CO2 24  GLUCOSE 133*  BUN 17  CREATININE 1.40*  CALCIUM 9.2  AST 20  ALT 10  ALKPHOS 144*  BILITOT 0.8   ------------------------------------------------------------------------------------------------------------------ estimated creatinine clearance is 33.3 mL/min (A) (by C-G formula based on SCr of 1.4 mg/dL (H)). ------------------------------------------------------------------------------------------------------------------ No results for input(s): "TSH", "T4TOTAL", "T3FREE",  "THYROIDAB" in the last 72 hours.  Invalid input(s): "FREET3"  Coagulation profile No results for input(s): "INR", "PROTIME" in the last 168 hours. ------------------------------------------------------------------------------------------------------------------- No results for input(s): "DDIMER" in the last 72 hours. -------------------------------------------------------------------------------------------------------------------  Cardiac Enzymes No results for input(s): "CKMB", "TROPONINI", "MYOGLOBIN" in the last 168 hours.  Invalid input(s): "CK" ------------------------------------------------------------------------------------------------------------------    Component Value Date/Time   BNP 105.0 (H) 12/02/2023 0907     ---------------------------------------------------------------------------------------------------------------  Urinalysis    Component Value Date/Time   COLORURINE YELLOW (A) 06/08/2023 1220   APPEARANCEUR Hazy (A) 08/18/2023 0900   LABSPEC 1.015 06/08/2023 1220   PHURINE 7.0 06/08/2023 1220   GLUCOSEU Negative 08/18/2023 0900   GLUCOSEU NEG mg/dL 16/04/9603 5409   HGBUR LARGE (A) 06/08/2023 1220   BILIRUBINUR Negative 08/18/2023 0900   KETONESUR NEGATIVE 06/08/2023 1220   PROTEINUR 1+ (A) 08/18/2023 0900   PROTEINUR 100 (A) 06/08/2023 1220   UROBILINOGEN 0.2 04/22/2014 2209   NITRITE Negative 08/18/2023 0900   NITRITE NEGATIVE 06/08/2023 1220   LEUKOCYTESUR 2+ (A) 08/18/2023 0900   LEUKOCYTESUR MODERATE (A) 06/08/2023 1220    ----------------------------------------------------------------------------------------------------------------   Imaging Results:    CT Angio Chest PE W and/or Wo Contrast Result Date: 12/02/2023 CLINICAL DATA:  Pulmonary embolism (PE) suspected, high prob. Shortness of breath. Cough. EXAM: CT ANGIOGRAPHY CHEST WITH CONTRAST TECHNIQUE: Multidetector CT imaging of the chest was performed using the standard protocol  during bolus administration of intravenous contrast. Multiplanar CT image reconstructions and MIPs were obtained to evaluate the vascular anatomy. RADIATION DOSE REDUCTION: This exam was performed according to the departmental dose-optimization program which includes automated exposure control, adjustment of the mA and/or kV according to patient size and/or use of iterative reconstruction technique. CONTRAST:  60mL OMNIPAQUE  IOHEXOL  350 MG/ML SOLN COMPARISON:  CT scan chest from 01/06/2004. FINDINGS: Cardiovascular: No evidence of embolism to the proximal subsegmental pulmonary artery level. Normal cardiac size. No pericardial effusion. No aortic aneurysm. There are coronary artery calcifications, in keeping with coronary artery disease. There are also mild peripheral atherosclerotic vascular calcifications of thoracic aorta and its major branches. Mediastinum/Nodes: Visualized thyroid gland appears grossly unremarkable. No  solid / cystic mediastinal masses. The esophagus is nondistended precluding optimal assessment. No axillary, mediastinal or hilar lymphadenopathy by size criteria. Lungs/Pleura: The central tracheo-bronchial tree is patent. There is mild, smooth, circumferential thickening of the segmental and subsegmental bronchial walls, throughout bilateral lungs, which is nonspecific. Findings are most commonly seen with bronchitis or reactive airway disease, such as asthma. Diffuse mild-to-moderate upper lobe predominant centrilobular emphysematous changes noted. There is a spiculated marginated 6 x 9 mm solid, noncalcified nodule in the right lung upper lobe (series 6, image 54). There are also multiple part solid nodules throughout bilateral lungs (marked with electronic arrow sign on series 6), with largest in the middle lobe measuring up to 2.2 x 2.8 cm. There are additional peripheral areas of scarring/atelectasis throughout bilateral lungs, mainly in the lower lobes. No consolidation, pleural effusion  or pneumothorax. Upper Abdomen: Surgically absent gallbladder. There multiple punctate calcifications in the spleen, likely sequela of prior granulomatous infection. There are at least 2, 5 mm or smaller nonobstructing calculi in bilateral kidneys. Remaining visualized upper abdominal viscera within normal limits. Musculoskeletal: A triple lead cardiac pacemaker / defibrillator is noted with its battery pack overlying the left chest wall and the leads terminating in the right atrium, apex of right ventricle and coronary sinus. Visualized soft tissues of the chest wall are otherwise grossly unremarkable. No suspicious osseous lesions. There are mild multilevel degenerative changes in the visualized spine. Review of the MIP images confirms the above findings. IMPRESSION: 1. No embolism to the proximal subsegmental pulmonary artery level. 2. Multiple part solid nodules throughout bilateral lungs with largest in the middle lobe measuring up to 2.2 x 2.8 cm. There is also a spiculated marginated 6 x 9 mm solid noncalcified nodule in the right lung upper lobe. Please see follow-up recommendations below. 3. Multiple other nonacute observations, as described above. Pulmonary nodule follow-up recommendation: Part-solid lung nodule more than or equal to 6 mm: - CT at 3-6 months to confirm persistence. If unchanged and solid component remains < 6 mm, annual CT should be performed for 5 years. Recommendations for evaluation of incidental nodules derived from guidelines developed by the Fleischner Society ( Radiology 2017; [phone]). These guidelines apply to incidental nodules, which can be managed according to the specific recommendations. These guidelines do not apply to patient's younger than 35 years, immunocompromised patients, or patients with cancer. For lung cancer screening, adherence to the existing [employer] of radiology lung CT Screening Reporting and Data System (lung-RADS) guidelines is recommended. High  risk factors include older age, heavy smoking, larger nodule size, irregular or spiculated margins and upper lobe location. Clinical risk factors include smoking, exposure to other carcinogens, emphysema, fibrosis, upper lobe location, family history of lung cancer, age and sex. Electronically Signed   By: Beula Brunswick M.D.   On: 12/02/2023 10:52   DG Chest Port 1 View Result Date: 12/02/2023 CLINICAL DATA:  cp dib. EXAM: PORTABLE CHEST 1 VIEW COMPARISON:  11/14/2021. FINDINGS: Bilateral lungs appear hyperexpanded and hyperlucent with coarse bronchovascular markings, concerning for underlying COPD. Bilateral lungs otherwise appear clear. No dense consolidation or lung collapse. Bilateral costophrenic angles are clear. Normal cardio-mediastinal silhouette. There is a left sided 3-lead pacemaker. No acute osseous abnormalities. The soft tissues are within normal limits. IMPRESSION: *No active disease. Probable COPD. Electronically Signed   By: Beula Brunswick M.D.   On: 12/02/2023 09:45     EKG:  Vent. rate 111 BPM PR interval 133 ms QRS duration 145 ms QT/QTcB 376/511  ms P-R-T axes 79 265 69 Sinus tachycardia Biatrial enlargement IVCD, consider atypical RBBB  Assessment & Plan:    Principal Problem:   Acute respiratory failure with hypoxia (HCC) Active Problems:   HIV DISEASE   TOBACCO USER   Essential hypertension    Acute respiratory failure with hypoxia COPD exacerbation -Patient presents with dyspnea, increased work of breathing, tachypnea, hypoxic on room air, requiring new oxygen supplements. - Workup significant for COPD exacerbation - Continue with IV Solu-Medrol  -Encouraged use incentive spirometry and flutter valve -Continue with scheduled DuoNebs and as needed albuterol  -Continue with Stiolto Respimat - Oxygen as tolerated - CTA chest negative for PE - Will check COVID, RSV and influenza  Tobacco abuse - She was counseled, reports she is trying to cut down, will start  on nicotine patch  Chronic systolic CHF AICD Elevated troponins Nontypical chest pain -Patient with known history of chronic systolic CHF with EF showing 30% from reviewing on Care Everywhere on records in in 2021. - Reports he is following with cardiologist Dr. Raechel Bulla and Conway Dennis - Continue with Coreg, reports he cannot afford Entresto, will start on low-dose losartan - Appears to be a euvolemic - Elevated troponin secondary to CKD and hypoxia, non-ACS pattern, her chest pain is not typical, related to cough, CTA chest negative for PE  Lung nodules -Aware of these findings, following with pulmonary at [facility]  HIV - Continue with home medications, continue with dapsone as well, for prophylaxis, patient reports she is compliant  Severe protein calorie malnutrition - Body mass index is 15.97 kg/m. - Will consult nutritionist  CKD stage IIIb - Does appear to be at baseline, avoid nephrotoxic medications  Secondary polycythemia -Secondary to tobacco abuse and chronic hypoxia  Thrombocytopenia - Stable, appears to be chronic by reviewing previous documentations     DVT Prophylaxis Lovenox  AM Labs Ordered, also please review Full Orders  Family Communication: Admission, patients condition and plan of care including tests being ordered have been discussed with the patient  who indicate understanding and agree with the plan and Code Status.  Code Status Full  Likely DC to  home  Condition GUARDED    Consults called: none    Admission status: inpatient    Time spent in minutes : 75 minutes   Seena Dadds M.D on 12/02/2023 at 3:31 PM   [facility] - Office  [phone]
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
## 2023-12-02 NOTE — ED Notes (Signed)
Cup of ice water given to patient.

## 2023-12-02 NOTE — ED Notes (Addendum)
 Patient ambulated to the restroom and back to room, oxygen levels at 79-84% on room air. Patient Claire Day shortness of breath other that "the usual", Pulse 125-130.  Patient put back on 3L of o2 via nasal canula, oxygen level come up to 92%. MD Made aware.

## 2023-12-02 NOTE — ED Triage Notes (Signed)
 Pt c/o SOB and cough that started this morning, along with back pain while breathing that started yesterday.

## 2023-12-02 NOTE — Progress Notes (Signed)
   12/02/23 2144  TOC Brief Assessment  Insurance and Status Reviewed  Patient has primary care physician Yes  Home environment has been reviewed From home  Prior level of function: Independent  Prior/Current Home Services No current home services  Social Drivers of Health Review SDOH reviewed interventions complete (smoking cessation added to AVS)  Readmission risk has been reviewed Yes  Transition of care needs no transition of care needs at this time   Transition of Care Department [facility]) has reviewed patient and no other TOC needs have been identified at this time. We will continue to monitor patient advancement through interdisciplinary progression rounds. If new patient needs arise, please place a TOC consult.

## 2023-12-02 NOTE — ED Notes (Signed)
 ED TO INPATIENT HANDOFF REPORT  ED Nurse Name and Phone #: Micah Ade RN [phone]  S Name/Age/Gender Claire Day 58 y.o. female Room/Bed: APA01/APA01  Code Status   Code Status: Full Code  Home/SNF/Other Home Patient oriented to: self, place, time, and situation Is this baseline? Yes   Triage Complete: Triage complete  Chief Complaint Acute respiratory failure with hypoxia (HCC) [J96.01]  Triage Note Pt c/o SOB and cough that started this morning, along with back pain while breathing that started yesterday.   Allergies Allergies  Allergen Reactions   Sulfonamide Derivatives Hives and Dermatitis   Tramadol Other (See Comments)    Unknown     Level of Care/Admitting Diagnosis ED Disposition     ED Disposition  Admit   Condition  --   Comment  Hospital Area: [facility] [[id number]]  Level of Care: Telemetry [5]  Covid Evaluation: Symptomatic Person Under Investigation (PUI) or recent exposure (last 10 days) *Testing Required*  Diagnosis: Acute respiratory failure with hypoxia [facility]) [[id number]]  Admitting Physician: Shyrl Doyne  Attending Physician: Osborne Blazer, DAWOOD S [[id number]]  Certification:: I certify this patient will need inpatient services for at least 2 midnights  Expected Medical Readiness: 12/05/2023          B Medical/Surgery History Past Medical History:  Diagnosis Date   Allergic rhinitis    Anemia    Aortic insufficiency    Cerebral aneurysm 02/12/2023   a.) CT head 02/12/2023:  3 x 3 mm distal LEFT M1 MCA just proximal to the bifurcation   Chronic systolic heart failure (HCC)    CKD (chronic kidney disease), stage III (HCC)    COPD (chronic obstructive pulmonary disease) (HCC)    History of kidney stones    HIV (human immunodeficiency virus infection) (HCC) 2003   a.) Dx'd in 2003; b.) currently Tx'd with darunavir-cobicistat + rilpivirine   HLD (hyperlipidemia)    Hydronephrosis    Hypertension    LBBB (left  bundle branch block)    MI (myocardial infarction) (HCC)    Moderate mitral regurgitation    Nephrolithiasis    NICM (nonischemic cardiomyopathy) (HCC)    Pneumonia due to Streptococcus (HCC)    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   a.) Medtronic device placed 09/20/2017 --> CLARIA MRI CRT-D QUAD DF-4 (SN: [id number])   Right upper lobe pulmonary nodule 02/12/2023   a.) CT imaging 02/12/2023:  9 x 7 mm   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    Thrombocytopenia (HCC)    TIA (transient ischemic attack) 02/12/2023   Tobacco abuse    Past Surgical History:  Procedure Laterality Date   BI-VENTRICULAR IMPLANTABLE CARDIOVERTER DEFIBRILLATOR  (CRT-D) Left 09/20/2017   Procedure: BI-VENTRICULAR IMPLANTABLE CARDIOVERTER DEFIBRILLATOR (CRT-D); Location: [facility]; Surgeon: Arvella Latina, MD   CHOLECYSTECTOMY     CYSTOSCOPY W/ URETERAL STENT PLACEMENT Left 06/10/2023   Procedure: CYSTOSCOPY WITH RETROGRADE PYELOGRAM/URETERAL STENT PLACEMENT;  Surgeon: Lawerence Pressman, MD;  Location: [facility] OR[facility];  Service: Urology;  Laterality: Left;   CYSTOSCOPY/URETEROSCOPY/HOLMIUM LASER/STENT PLACEMENT Left 07/05/2023   Procedure: CYSTOSCOPY/URETEROSCOPY/HOLMIUM LASER/STENT EXCHANGE;  Surgeon: Geraline Knapp, MD;  Location: [facility] OR[facility];  Service: Urology;  Laterality: Left;     A IV Location/Drains/Wounds Patient Lines/Drains/Airways Status     Active Line/Drains/Airways     Name Placement date Placement time Site Days   Peripheral IV 12/02/23 22 G 1" Right Antecubital 12/02/23  0923  Antecubital  less than 1   Ureteral Drain/Stent  Left ureter 6 Fr. 07/05/23  1305  Left ureter  150            Intake/Output Last 24 hours No intake or output data in the 24 hours ending 12/02/23 1549  Labs/Imaging Results for orders placed or performed during the hospital encounter of 12/02/23 (from the past 48 hours)  Troponin I (High Sensitivity)     Status:  Abnormal   Collection Time: 12/02/23  9:07 AM  Result Value Ref Range   Troponin I (High Sensitivity) 33 (H) <18 ng/L    Comment: (NOTE) Elevated high sensitivity troponin I (hsTnI) values and significant  changes across serial measurements may suggest ACS but many other  chronic and acute conditions are known to elevate hsTnI results.  Refer to the "Links" section for chest pain algorithms and additional  guidance. Performed at [facility], [address], [place], [place] [address]   Comprehensive metabolic panel     Status: Abnormal   Collection Time: 12/02/23  9:07 AM  Result Value Ref Range   Sodium 139 135 - 145 mmol/L   Potassium 3.8 3.5 - 5.1 mmol/L   Chloride 103 98 - 111 mmol/L   CO2 24 22 - 32 mmol/L   Glucose, Bld 133 (H) 70 - 99 mg/dL    Comment: Glucose reference range applies only to samples taken after fasting for at least 8 hours.   BUN 17 6 - 20 mg/dL   Creatinine, Ser 1.91 (H) 0.44 - 1.00 mg/dL   Calcium 9.2 8.9 - 47.8 mg/dL   Total Protein 6.9 6.5 - 8.1 g/dL   Albumin 3.8 3.5 - 5.0 g/dL   AST 20 15 - 41 U/L   ALT 10 0 - 44 U/L   Alkaline Phosphatase 144 (H) 38 - 126 U/L   Total Bilirubin 0.8 0.0 - 1.2 mg/dL   GFR, Estimated 44 (L) >60 mL/min    Comment: (NOTE) Calculated using the CKD-EPI Creatinine Equation (2021)    Anion gap 12 5 - 15    Comment: Performed at [facility], [address], [place], [place] [address]  CBC with Differential     Status: Abnormal   Collection Time: 12/02/23  9:07 AM  Result Value Ref Range   WBC 10.1 4.0 - 10.5 K/uL   RBC 5.65 (H) 3.87 - 5.11 MIL/uL   Hemoglobin 16.8 (H) 12.0 - 15.0 g/dL   HCT 13.0 (H) 86.5 - 78.4 %   MCV 90.3 80.0 - 100.0 fL   MCH 29.7 26.0 - 34.0 pg   MCHC 32.9 30.0 - 36.0 g/dL   RDW 69.6 29.5 - 28.4 %   Platelets 113 (L) 150 - 400 K/uL   nRBC 0.0 0.0 - 0.2 %   Neutrophils Relative % 78 %   Neutro Abs 7.8 (H) 1.7 - 7.7 K/uL   Lymphocytes Relative 15 %   Lymphs Abs 1.5 0.7 - 4.0 K/uL   Monocytes  Relative 7 %   Monocytes Absolute 0.7 0.1 - 1.0 K/uL   Eosinophils Relative 0 %   Eosinophils Absolute 0.0 0.0 - 0.5 K/uL   Basophils Relative 0 %   Basophils Absolute 0.0 0.0 - 0.1 K/uL   Immature Granulocytes 0 %   Abs Immature Granulocytes 0.04 0.00 - 0.07 K/uL    Comment: Performed at [facility], [address], [place], [place] [address]  Brain natriuretic peptide     Status: Abnormal   Collection Time: 12/02/23  9:07 AM  Result Value Ref Range  B Natriuretic Peptide 105.0 (H) 0.0 - 100.0 pg/mL    Comment: Performed at [facility], [address] [place], [place] [address]  Troponin I (High Sensitivity)     Status: Abnormal   Collection Time: 12/02/23 11:05 AM  Result Value Ref Range   Troponin I (High Sensitivity) 36 (H) <18 ng/L    Comment: (NOTE) Elevated high sensitivity troponin I (hsTnI) values and significant  changes across serial measurements may suggest ACS but many other  chronic and acute conditions are known to elevate hsTnI results.  Refer to the "Links" section for chest pain algorithms and additional  guidance. Performed at [facility], [address], [place], [place] [address]    CT Angio Chest PE W and/or Wo Contrast Result Date: 12/02/2023 CLINICAL DATA:  Pulmonary embolism (PE) suspected, high prob. Shortness of breath. Cough. EXAM: CT ANGIOGRAPHY CHEST WITH CONTRAST TECHNIQUE: Multidetector CT imaging of the chest was performed using the standard protocol during bolus administration of intravenous contrast. Multiplanar CT image reconstructions and MIPs were obtained to evaluate the vascular anatomy. RADIATION DOSE REDUCTION: This exam was performed according to the departmental dose-optimization program which includes automated exposure control, adjustment of the mA and/or kV according to patient size and/or use of iterative reconstruction technique. CONTRAST:  60mL OMNIPAQUE  IOHEXOL  350 MG/ML SOLN COMPARISON:  CT scan chest from 01/06/2004. FINDINGS:  Cardiovascular: No evidence of embolism to the proximal subsegmental pulmonary artery level. Normal cardiac size. No pericardial effusion. No aortic aneurysm. There are coronary artery calcifications, in keeping with coronary artery disease. There are also mild peripheral atherosclerotic vascular calcifications of thoracic aorta and its major branches. Mediastinum/Nodes: Visualized thyroid gland appears grossly unremarkable. No solid / cystic mediastinal masses. The esophagus is nondistended precluding optimal assessment. No axillary, mediastinal or hilar lymphadenopathy by size criteria. Lungs/Pleura: The central tracheo-bronchial tree is patent. There is mild, smooth, circumferential thickening of the segmental and subsegmental bronchial walls, throughout bilateral lungs, which is nonspecific. Findings are most commonly seen with bronchitis or reactive airway disease, such as asthma. Diffuse mild-to-moderate upper lobe predominant centrilobular emphysematous changes noted. There is a spiculated marginated 6 x 9 mm solid, noncalcified nodule in the right lung upper lobe (series 6, image 54). There are also multiple part solid nodules throughout bilateral lungs (marked with electronic arrow sign on series 6), with largest in the middle lobe measuring up to 2.2 x 2.8 cm. There are additional peripheral areas of scarring/atelectasis throughout bilateral lungs, mainly in the lower lobes. No consolidation, pleural effusion or pneumothorax. Upper Abdomen: Surgically absent gallbladder. There multiple punctate calcifications in the spleen, likely sequela of prior granulomatous infection. There are at least 2, 5 mm or smaller nonobstructing calculi in bilateral kidneys. Remaining visualized upper abdominal viscera within normal limits. Musculoskeletal: A triple lead cardiac pacemaker / defibrillator is noted with its battery pack overlying the left chest wall and the leads terminating in the right atrium, apex of right  ventricle and coronary sinus. Visualized soft tissues of the chest wall are otherwise grossly unremarkable. No suspicious osseous lesions. There are mild multilevel degenerative changes in the visualized spine. Review of the MIP images confirms the above findings. IMPRESSION: 1. No embolism to the proximal subsegmental pulmonary artery level. 2. Multiple part solid nodules throughout bilateral lungs with largest in the middle lobe measuring up to 2.2 x 2.8 cm. There is also a spiculated marginated 6 x 9 mm solid noncalcified nodule in the right lung upper lobe. Please see follow-up recommendations below. 3. Multiple  other nonacute observations, as described above. Pulmonary nodule follow-up recommendation: Part-solid lung nodule more than or equal to 6 mm: - CT at 3-6 months to confirm persistence. If unchanged and solid component remains < 6 mm, annual CT should be performed for 5 years. Recommendations for evaluation of incidental nodules derived from guidelines developed by the Fleischner Society ( Radiology 2017; [phone]). These guidelines apply to incidental nodules, which can be managed according to the specific recommendations. These guidelines do not apply to patient's younger than 35 years, immunocompromised patients, or patients with cancer. For lung cancer screening, adherence to the existing [employer] of radiology lung CT Screening Reporting and Data System (lung-RADS) guidelines is recommended. High risk factors include older age, heavy smoking, larger nodule size, irregular or spiculated margins and upper lobe location. Clinical risk factors include smoking, exposure to other carcinogens, emphysema, fibrosis, upper lobe location, family history of lung cancer, age and sex. Electronically Signed   By: Beula Brunswick M.D.   On: 12/02/2023 10:52   DG Chest Port 1 View Result Date: 12/02/2023 CLINICAL DATA:  cp dib. EXAM: PORTABLE CHEST 1 VIEW COMPARISON:  11/14/2021. FINDINGS: Bilateral lungs  appear hyperexpanded and hyperlucent with coarse bronchovascular markings, concerning for underlying COPD. Bilateral lungs otherwise appear clear. No dense consolidation or lung collapse. Bilateral costophrenic angles are clear. Normal cardio-mediastinal silhouette. There is a left sided 3-lead pacemaker. No acute osseous abnormalities. The soft tissues are within normal limits. IMPRESSION: *No active disease. Probable COPD. Electronically Signed   By: Beula Brunswick M.D.   On: 12/02/2023 09:45    Pending Labs Unresulted Labs (From admission, onward)     Start     Ordered   12/03/23 0500  Basic metabolic panel  Tomorrow morning,   R        12/02/23 1530   12/03/23 0500  CBC  Tomorrow morning,   R        12/02/23 1530   12/02/23 1519  Resp panel by RT-PCR (RSV, Flu A&B, Covid) Anterior Nasal Swab  (Resp Panel by RT-PCR (RSV, Flu A&B, Covid) with precautions)  Once,   R        12/02/23 1519            Vitals/Pain Today's Vitals   12/02/23 1502 12/02/23 1504 12/02/23 1515 12/02/23 1530  BP:   (!) 163/99 (!) 153/82  Pulse: (!) 111  (!) 119 (!) 111  Resp: 16  (!) 22 20  Temp:  97.7 F (36.5 C)    TempSrc:  Oral    SpO2: 92%  (!) 88% 91%  Weight:      Height:      PainSc:        Isolation Precautions Airborne and Contact precautions  Medications Medications  HYDROcodone -acetaminophen  (NORCO/VICODIN) 5-325 MG per tablet 1 tablet (has no administration in time range)  dapsone tablet 100 mg (has no administration in time range)  darunavir-cobicistat (PREZCOBIX) 800-150 MG per tablet 1 tablet (has no administration in time range)  carvedilol (COREG) tablet 6.25 mg (has no administration in time range)  arformoterol (BROVANA) nebulizer solution 15 mcg (has no administration in time range)    And  umeclidinium bromide (INCRUSE ELLIPTA) 62.5 MCG/ACT 1 puff (has no administration in time range)  enoxaparin (LOVENOX) injection 40 mg (has no administration in time range)  albuterol   (PROVENTIL ) (2.5 MG/3ML) 0.083% nebulizer solution 2.5 mg (has no administration in time range)  nicotine (NICODERM CQ - dosed in mg/24 hours) patch 21 mg (  has no administration in time range)  cefTRIAXone  (ROCEPHIN ) 1 g in sodium chloride  0.9 % 100 mL IVPB (has no administration in time range)  methylPREDNISolone  sodium succinate (SOLU-MEDROL ) 125 mg/2 mL injection 125 mg (has no administration in time range)  pantoprazole (PROTONIX) EC tablet 40 mg (has no administration in time range)  ipratropium-albuterol  (DUONEB) 0.5-2.5 (3) MG/3ML nebulizer solution 3 mL (has no administration in time range)  losartan (COZAAR) tablet 25 mg (has no administration in time range)  sodium chloride  0.9 % bolus 1,000 mL (1,000 mLs Intravenous Bolus 12/02/23 1109)  ipratropium-albuterol  (DUONEB) 0.5-2.5 (3) MG/3ML nebulizer solution 3 mL (3 mLs Nebulization Given 12/02/23 0919)  magnesium sulfate IVPB 2 g 50 mL (0 g Intravenous Stopped 12/02/23 1430)  methylPREDNISolone  sodium succinate (SOLU-MEDROL ) 125 mg/2 mL injection 125 mg (125 mg Intravenous Given 12/02/23 1108)  iohexol  (OMNIPAQUE ) 350 MG/ML injection 60 mL (60 mLs Intravenous Contrast Given 12/02/23 1025)  ketorolac  (TORADOL ) 15 MG/ML injection 15 mg (15 mg Intravenous Given 12/02/23 1146)  ipratropium-albuterol  (DUONEB) 0.5-2.5 (3) MG/3ML nebulizer solution 3 mL (3 mLs Nebulization Given 12/02/23 1123)  morphine (PF) 4 MG/ML injection 4 mg (4 mg Intravenous Given 12/02/23 1147)  ondansetron  (ZOFRAN ) injection 4 mg (4 mg Intravenous Given 12/02/23 1146)  albuterol  (PROVENTIL ) (2.5 MG/3ML) 0.083% nebulizer solution 10 mg (10 mg Nebulization Given 12/02/23 1248)  aspirin tablet 325 mg (325 mg Oral Given 12/02/23 1301)    Mobility walks with person assist     Focused Assessments Pulmonary Assessment Handoff:  Lung sounds: Bilateral Breath Sounds: Diminished, Expiratory wheezes O2 Device: Nasal Cannula O2 Flow Rate (L/min): 3 L/min    R Recommendations: See  Admitting Provider Note  Report given to:   Additional Notes:

## 2023-12-03 DIAGNOSIS — F17200 Nicotine dependence, unspecified, uncomplicated: Secondary | ICD-10-CM

## 2023-12-03 DIAGNOSIS — B20 Human immunodeficiency virus [HIV] disease: Secondary | ICD-10-CM | POA: Diagnosis not present

## 2023-12-03 DIAGNOSIS — I10 Essential (primary) hypertension: Secondary | ICD-10-CM | POA: Diagnosis not present

## 2023-12-03 DIAGNOSIS — J441 Chronic obstructive pulmonary disease with (acute) exacerbation: Secondary | ICD-10-CM

## 2023-12-03 DIAGNOSIS — J9601 Acute respiratory failure with hypoxia: Principal | ICD-10-CM

## 2023-12-03 LAB — CBC
HCT: 45.2 % (ref 36.0–46.0)
Hemoglobin: 14.9 g/dL (ref 12.0–15.0)
MCH: 29.9 pg (ref 26.0–34.0)
MCHC: 33 g/dL (ref 30.0–36.0)
MCV: 90.8 fL (ref 80.0–100.0)
Platelets: 106 10*3/uL — ABNORMAL LOW (ref 150–400)
RBC: 4.98 MIL/uL (ref 3.87–5.11)
RDW: 14.4 % (ref 11.5–15.5)
WBC: 16 10*3/uL — ABNORMAL HIGH (ref 4.0–10.5)
nRBC: 0 % (ref 0.0–0.2)

## 2023-12-03 LAB — BASIC METABOLIC PANEL WITH GFR
Anion gap: 10 (ref 5–15)
BUN: 21 mg/dL — ABNORMAL HIGH (ref 6–20)
CO2: 25 mmol/L (ref 22–32)
Calcium: 8.8 mg/dL — ABNORMAL LOW (ref 8.9–10.3)
Chloride: 104 mmol/L (ref 98–111)
Creatinine, Ser: 1.25 mg/dL — ABNORMAL HIGH (ref 0.44–1.00)
GFR, Estimated: 50 mL/min — ABNORMAL LOW (ref >60)
Glucose, Bld: 136 mg/dL — ABNORMAL HIGH (ref 70–99)
Potassium: 3.9 mmol/L (ref 3.5–5.1)
Sodium: 139 mmol/L (ref 135–145)

## 2023-12-03 MED ORDER — NICOTINE 14 MG/24HR TD PT24
14.0000 mg | MEDICATED_PATCH | Freq: Every day | TRANSDERMAL | Status: DC
  Administered 2023-12-04 – 2023-12-05 (×2): 14 mg via TRANSDERMAL
  Filled 2023-12-03 (×2): qty 1

## 2023-12-03 MED ORDER — METHYLPREDNISOLONE SODIUM SUCC 125 MG IJ SOLR
80.0000 mg | Freq: Every day | INTRAMUSCULAR | Status: DC
  Administered 2023-12-03 – 2023-12-05 (×3): 80 mg via INTRAVENOUS
  Filled 2023-12-03 (×3): qty 2

## 2023-12-03 MED ORDER — ACETAMINOPHEN 325 MG PO TABS
650.0000 mg | ORAL_TABLET | Freq: Four times a day (QID) | ORAL | Status: DC
  Administered 2023-12-03 – 2023-12-05 (×4): 650 mg via ORAL
  Filled 2023-12-03 (×7): qty 2

## 2023-12-03 MED ORDER — ENSURE ENLIVE PO LIQD
237.0000 mL | Freq: Two times a day (BID) | ORAL | Status: DC
  Administered 2023-12-03 – 2023-12-05 (×3): 237 mL via ORAL

## 2023-12-03 MED ORDER — RILPIVIRINE HCL 25 MG PO TABS
25.0000 mg | ORAL_TABLET | Freq: Every day | ORAL | Status: DC
  Administered 2023-12-03 – 2023-12-05 (×3): 25 mg via ORAL
  Filled 2023-12-03 (×4): qty 1

## 2023-12-03 NOTE — Plan of Care (Signed)

## 2023-12-03 NOTE — Progress Notes (Signed)
 PROGRESS NOTE  Claire Day RUE:[id number] DOB: [birth year]   PCP: Tressie Fryer, MD  Patient is from: Home.  DOA: 12/02/2023 LOS: 1  Chief complaints Chief Complaint  Patient presents with   Shortness of Breath     Brief Narrative / Interim history: 58 year old F with PMH of COPD, CKD-3B, HIV, systolic CHF/NICM s/p AICD, tobacco use disorder and cerebral aneurysm presenting with feeling unwell, dry cough, shortness of breath, congestion and chest pain with cough, and admitted with acute respiratory failure with hypoxia in the setting of COPD exacerbation.  Patient is followed by pulmonology in [place].  Desaturated to 85% on RA and started on supplemental oxygen.  She was also slightly tachycardic and tachypneic.  CT angio chest negative for PE but multiple pulmonary nodules.  COVID-19, influenza and RSV PCR negative.  Started on IV Solu-Medrol , scheduled and as needed nebulizers and antibiotics.   Subjective: Seen and examined earlier this morning.  No major events overnight of this morning.  Reports improvement in her breathing.  Continues to have dry cough.  She reports diffuse back pain.  Denies trauma, injury, heavy lifting or prior back surgery.  Denies focal neurosymptoms in her legs.  Objective: Vitals:   12/02/23 2042 12/03/23 0048 12/03/23 0251 12/03/23 0925  BP:  109/78 (!) 149/88   Pulse:  90 75   Resp:  17 18   Temp:  98.7 F (37.1 C) 97.7 F (36.5 C)   TempSrc:   Oral   SpO2: 95% 95% 95% 91%  Weight:      Height:        Examination:  GENERAL: No apparent distress.  Appears frail. HEENT: MMM.  Vision and hearing grossly intact.  NECK: Supple.  No apparent JVD.  RESP:  No IWOB.  Diminished aeration bilaterally. CVS:  RRR. Heart sounds normal.  ABD/GI/GU: BS+. Abd soft, NTND.  MSK/EXT:  Moves extremities.  Fuhs tenderness in the buttocks. SKIN: no apparent skin lesion or wound NEURO: Awake, alert and oriented appropriately.  No apparent focal neuro  deficit. PSYCH: Calm. Normal affect.   Consultants:  None  Procedures: None  Microbiology summarized: COVID-19, influenza and RSV PCR nonreactive  Assessment and plan: Acute respiratory failure with hypoxia due to COPD exacerbation: Current smoker.  Desaturated to 85% on RA requiring supplemental oxygen.  CT angio chest negative for PE but multiple pulmonary nodules.  Currently saturating in mid 90s on 3 L.  Respiratory symptoms improved.  Exam with diminished aeration. -Encouraged smoking cessation -Decrease Solu-Medrol  to 80 mg daily -Continue scheduled and as needed nebulizers -Wean oxygen as able.  Incentive spirometry, OOB and ambulatory saturation  Chronic systolic CHF/NICM s/p AICD: Appears euvolemic on exam.  TTE in 2021 with LVEF of 30%.  Followed by cardiologist, Dr. Raechel Bulla in [place].  Does not seem to be on medications for this.  Reportedly not able to afford Entresto.  Started on losartan here.  Appears euvolemic on exam. - Continue losartan - Outpatient follow-up  Elevated troponins/atypical chest pain: Chest pain is with cough.  Mild troponin elevation without delta. -Mild COPD as above  CKD-3B: Creatinine improved. Recent Labs    06/08/23 1053 12/02/23 0907 12/03/23 0439  BUN 26* 17 21*  CREATININE 1.59* 1.40* 1.25*  - Continue monitoring  Lung nodules: Aware of these findings, following with pulmonary at [facility] -Outpatient follow-up with pulmonology -Encouraged smoking cessation  Tobacco use disorder: Reports smoking about 6 to 7 cigarettes a day.  Working on quitting. -Encouraged smoking cessation -Nicotine patch  HIV -Continue home meds  Secondary polycythemia: Resolved   Thrombocytopenia: Stable - Continue monitoring  Leukocytosis: Likely demargination from steroid  Severe protein calorie malnutrition Body mass index is 15.97 kg/m. - Consult dietitian          DVT prophylaxis:  enoxaparin (LOVENOX) injection 40 mg Start:  12/03/23 1000  Code Status: Full code Family Communication: None at bedside Level of care: Med-Surg Status is: Inpatient Remains inpatient appropriate because: Respiratory failure with hypoxia due to COPD exacerbation   Final disposition: Home in the next 24 to 48 hours   55 minutes with more than 50% spent in reviewing records, counseling patient/family and coordinating care.   Sch Meds:  Scheduled Meds:  arformoterol  15 mcg Nebulization BID   And   umeclidinium bromide  1 puff Inhalation Daily   carvedilol  6.25 mg Oral BID WC   dapsone  100 mg Oral Daily   darunavir-cobicistat  1 tablet Oral QHS   enoxaparin (LOVENOX) injection  40 mg Subcutaneous Q24H   feeding supplement  237 mL Oral BID BM   ipratropium-albuterol   3 mL Nebulization TID   losartan  25 mg Oral Daily   methylPREDNISolone  (SOLU-MEDROL ) injection  80 mg Intravenous Daily   nicotine  21 mg Transdermal Daily   pantoprazole  40 mg Oral Daily   rilpivirine  25 mg Oral Q breakfast   Continuous Infusions:  cefTRIAXone  (ROCEPHIN )  IV 1 g (12/02/23 1942)   PRN Meds:.albuterol , HYDROcodone -acetaminophen   Antimicrobials: Anti-infectives (From admission, onward)    Start     Dose/Rate Route Frequency Ordered Stop   12/03/23 0800  rilpivirine (EDURANT) tablet 25 mg        25 mg Oral Daily with breakfast 12/03/23 0051     12/02/23 2200  dapsone tablet 100 mg        100 mg Oral Daily 12/02/23 1530     12/02/23 2200  darunavir-cobicistat (PREZCOBIX) 800-150 MG per tablet 1 tablet       Note to Pharmacy: Swallow whole. Do NOT crush, break or chew tablets. Take with food.     1 tablet Oral Daily at bedtime 12/02/23 1530     12/02/23 1600  cefTRIAXone  (ROCEPHIN ) 1 g in sodium chloride  0.9 % 100 mL IVPB        1 g 200 mL/hr over 30 Minutes Intravenous Every 24 hours 12/02/23 1530          I have personally reviewed the following labs and images: CBC: Recent Labs  Lab 12/02/23 0907 12/03/23 0439  WBC 10.1  16.0*  NEUTROABS 7.8*  --   HGB 16.8* 14.9  HCT 51.0* 45.2  MCV 90.3 90.8  PLT 113* 106*   BMP &GFR Recent Labs  Lab 12/02/23 0907 12/03/23 0439  NA 139 139  K 3.8 3.9  CL 103 104  CO2 24 25  GLUCOSE 133* 136*  BUN 17 21*  CREATININE 1.40* 1.25*  CALCIUM 9.2 8.8*   Estimated Creatinine Clearance: 37.3 mL/min (A) (by C-G formula based on SCr of 1.25 mg/dL (H)). Liver & Pancreas: Recent Labs  Lab 12/02/23 0907  AST 20  ALT 10  ALKPHOS 144*  BILITOT 0.8  PROT 6.9  ALBUMIN 3.8   No results for input(s): "LIPASE", "AMYLASE" in the last 168 hours. No results for input(s): "AMMONIA" in the last 168 hours. Diabetic: No results for input(s): "HGBA1C" in the last 72 hours. No results for input(s): "GLUCAP" in the last 168 hours. Cardiac Enzymes: No results  for input(s): "CKTOTAL", "CKMB", "CKMBINDEX", "TROPONINI" in the last 168 hours. No results for input(s): "PROBNP" in the last 8760 hours. Coagulation Profile: No results for input(s): "INR", "PROTIME" in the last 168 hours. Thyroid Function Tests: No results for input(s): "TSH", "T4TOTAL", "FREET4", "T3FREE", "THYROIDAB" in the last 72 hours. Lipid Profile: No results for input(s): "CHOL", "HDL", "LDLCALC", "TRIG", "CHOLHDL", "LDLDIRECT" in the last 72 hours. Anemia Panel: No results for input(s): "VITAMINB12", "FOLATE", "FERRITIN", "TIBC", "IRON", "RETICCTPCT" in the last 72 hours. Urine analysis:    Component Value Date/Time   COLORURINE YELLOW (A) 06/08/2023 1220   APPEARANCEUR Hazy (A) 08/18/2023 0900   LABSPEC 1.015 06/08/2023 1220   PHURINE 7.0 06/08/2023 1220   GLUCOSEU Negative 08/18/2023 0900   GLUCOSEU NEG mg/dL 08/65/7846 9629   HGBUR LARGE (A) 06/08/2023 1220   BILIRUBINUR Negative 08/18/2023 0900   KETONESUR NEGATIVE 06/08/2023 1220   PROTEINUR 1+ (A) 08/18/2023 0900   PROTEINUR 100 (A) 06/08/2023 1220   UROBILINOGEN 0.2 04/22/2014 2209   NITRITE Negative 08/18/2023 0900   NITRITE NEGATIVE  06/08/2023 1220   LEUKOCYTESUR 2+ (A) 08/18/2023 0900   LEUKOCYTESUR MODERATE (A) 06/08/2023 1220   Sepsis Labs: Invalid input(s): "PROCALCITONIN", "LACTICIDVEN"  Microbiology: Recent Results (from the past 240 hours)  Resp panel by RT-PCR (RSV, Flu A&B, Covid) Anterior Nasal Swab     Status: None   Collection Time: 12/02/23  3:19 PM   Specimen: Anterior Nasal Swab  Result Value Ref Range Status   SARS Coronavirus 2 by RT PCR NEGATIVE NEGATIVE Final    Comment: (NOTE) SARS-CoV-2 target nucleic acids are NOT DETECTED.  The SARS-CoV-2 RNA is generally detectable in upper respiratory specimens during the acute phase of infection. The lowest concentration of SARS-CoV-2 viral copies this assay can detect is 138 copies/mL. A negative result does not preclude SARS-Cov-2 infection and should not be used as the sole basis for treatment or other patient management decisions. A negative result may occur with  improper specimen collection/handling, submission of specimen other than nasopharyngeal swab, presence of viral mutation(s) within the areas targeted by this assay, and inadequate number of viral copies(<138 copies/mL). A negative result must be combined with clinical observations, patient history, and epidemiological information. The expected result is Negative.  Fact Sheet for Patients:  BloggerCourse.com  Fact Sheet for Healthcare Providers:  SeriousBroker.it  This test is no t yet approved or cleared by the [place]  FDA and  has been authorized for detection and/or diagnosis of SARS-CoV-2 by FDA under an Emergency Use Authorization (EUA). This EUA will remain  in effect (meaning this test can be used) for the duration of the COVID-19 declaration under Section 564(b)(1) of the Act, 21 U.S.C.section 360bbb-3(b)(1), unless the authorization is terminated  or revoked sooner.       Influenza A by PCR NEGATIVE NEGATIVE Final    Influenza B by PCR NEGATIVE NEGATIVE Final    Comment: (NOTE) The Xpert Xpress SARS-CoV-2/FLU/RSV plus assay is intended as an aid in the diagnosis of influenza from Nasopharyngeal swab specimens and should not be used as a sole basis for treatment. Nasal washings and aspirates are unacceptable for Xpert Xpress SARS-CoV-2/FLU/RSV testing.  Fact Sheet for Patients: BloggerCourse.com  Fact Sheet for Healthcare Providers: SeriousBroker.it  This test is not yet approved or cleared by the [place]  FDA and has been authorized for detection and/or diagnosis of SARS-CoV-2 by FDA under an Emergency Use Authorization (EUA). This EUA will remain in effect (meaning this test can be used) for  the duration of the COVID-19 declaration under Section 564(b)(1) of the Act, 21 U.S.C. section 360bbb-3(b)(1), unless the authorization is terminated or revoked.     Resp Syncytial Virus by PCR NEGATIVE NEGATIVE Final    Comment: (NOTE) Fact Sheet for Patients: BloggerCourse.com  Fact Sheet for Healthcare Providers: SeriousBroker.it  This test is not yet approved or cleared by the [place]  FDA and has been authorized for detection and/or diagnosis of SARS-CoV-2 by FDA under an Emergency Use Authorization (EUA). This EUA will remain in effect (meaning this test can be used) for the duration of the COVID-19 declaration under Section 564(b)(1) of the Act, 21 U.S.C. section 360bbb-3(b)(1), unless the authorization is terminated or revoked.  Performed at [facility], [address], [place], [place] [address]     Radiology Studies: No results found.    Maecie Sevcik T. Eldean Nanna [facility]  If 7PM-7AM, please contact night-coverage [email or web address] 12/03/2023, 11:30 AM
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
## 2023-12-03 NOTE — Progress Notes (Signed)
   12/03/23 1449  TOC Assessment  TOC screening is complete Yes  Once discharged, how will the patient get to their discharge location? Family/Friend - Partnered Transport  Barriers to Discharge Continued Medical Work up  Patient states their goals for this hospitalization and ongoing recovery are: Home  Living arrangements for the past 2 months Apartment  Lives with: Adult Children  Share Information with NAME America Bake  Permission granted to share info w Contact Information [phone]  Patient language and need for interpreter reviewed: No  Care giver support system in place? Y  Appearance: Appears stated age  Attitude/Demeanor/Rapport Engaged  Affect (typically observed) Stable  Orientation:  Oriented to Self;Oriented to Place;Oriented to  Time;Oriented to Situation   Admitted with acute respiratory failure with hypoxia.

## 2023-12-04 DIAGNOSIS — J9601 Acute respiratory failure with hypoxia: Secondary | ICD-10-CM | POA: Diagnosis not present

## 2023-12-04 DIAGNOSIS — I10 Essential (primary) hypertension: Secondary | ICD-10-CM | POA: Diagnosis not present

## 2023-12-04 DIAGNOSIS — B20 Human immunodeficiency virus [HIV] disease: Secondary | ICD-10-CM | POA: Diagnosis not present

## 2023-12-04 DIAGNOSIS — F17200 Nicotine dependence, unspecified, uncomplicated: Secondary | ICD-10-CM | POA: Diagnosis not present

## 2023-12-04 LAB — CBC
HCT: 40.7 % (ref 36.0–46.0)
Hemoglobin: 13.2 g/dL (ref 12.0–15.0)
MCH: 29.8 pg (ref 26.0–34.0)
MCHC: 32.4 g/dL (ref 30.0–36.0)
MCV: 91.9 fL (ref 80.0–100.0)
Platelets: 113 10*3/uL — ABNORMAL LOW (ref 150–400)
RBC: 4.43 MIL/uL (ref 3.87–5.11)
RDW: 14.5 % (ref 11.5–15.5)
WBC: 15.7 10*3/uL — ABNORMAL HIGH (ref 4.0–10.5)
nRBC: 0 % (ref 0.0–0.2)

## 2023-12-04 LAB — RENAL FUNCTION PANEL
Albumin: 2.8 g/dL — ABNORMAL LOW (ref 3.5–5.0)
Anion gap: 5 (ref 5–15)
BUN: 29 mg/dL — ABNORMAL HIGH (ref 6–20)
CO2: 25 mmol/L (ref 22–32)
Calcium: 8.6 mg/dL — ABNORMAL LOW (ref 8.9–10.3)
Chloride: 106 mmol/L (ref 98–111)
Creatinine, Ser: 1.5 mg/dL — ABNORMAL HIGH (ref 0.44–1.00)
GFR, Estimated: 40 mL/min — ABNORMAL LOW (ref >60)
Glucose, Bld: 133 mg/dL — ABNORMAL HIGH (ref 70–99)
Phosphorus: 2.9 mg/dL (ref 2.5–4.6)
Potassium: 4.7 mmol/L (ref 3.5–5.1)
Sodium: 136 mmol/L (ref 135–145)

## 2023-12-04 LAB — MAGNESIUM: Magnesium: 2.2 mg/dL (ref 1.7–2.4)

## 2023-12-04 MED ORDER — HYDRALAZINE HCL 25 MG PO TABS
25.0000 mg | ORAL_TABLET | Freq: Three times a day (TID) | ORAL | Status: DC
  Administered 2023-12-04 – 2023-12-05 (×3): 25 mg via ORAL
  Filled 2023-12-04 (×3): qty 1

## 2023-12-04 MED ORDER — ISOSORBIDE MONONITRATE ER 30 MG PO TB24
15.0000 mg | ORAL_TABLET | Freq: Every day | ORAL | Status: DC
  Administered 2023-12-04 – 2023-12-05 (×2): 15 mg via ORAL
  Filled 2023-12-04 (×2): qty 1

## 2023-12-04 MED ORDER — PREDNISONE 50 MG PO TABS: 50.0000 mg | ORAL_TABLET | Freq: Every day | ORAL | 0 refills | Status: AC

## 2023-12-04 MED ORDER — ADULT MULTIVITAMIN W/MINERALS CH
1.0000 | ORAL_TABLET | Freq: Every day | ORAL | Status: DC
  Administered 2023-12-04 – 2023-12-05 (×2): 1 via ORAL
  Filled 2023-12-04 (×2): qty 1

## 2023-12-04 MED ORDER — NICOTINE 14 MG/24HR TD PT24: 14.0000 mg | MEDICATED_PATCH | Freq: Every day | TRANSDERMAL | Status: DC

## 2023-12-04 MED ORDER — LOSARTAN POTASSIUM 25 MG PO TABS: 25.0000 mg | ORAL_TABLET | Freq: Every day | ORAL | 0 refills | Status: DC

## 2023-12-04 NOTE — Progress Notes (Signed)
SATURATION QUALIFICATIONS: (This note is used to comply with regulatory documentation for home oxygen)  Patient Saturations on Room Air at Rest = 96%  Patient Saturations on Room Air while Ambulating = 87%  Patient Saturations on 2 Liters of oxygen while Ambulating = 96%  Please briefly explain why patient needs home oxygen: 

## 2023-12-04 NOTE — Progress Notes (Signed)
 PROGRESS NOTE  Claire Day RUE:[id number] DOB: 22-Oct-1965   PCP: Tressie Fryer, MD  Patient is from: Home.  DOA: 12/02/2023 LOS: 2  Chief complaints Chief Complaint  Patient presents with   Shortness of Breath     Brief Narrative / Interim history: 58 year old F with PMH of COPD, CKD-3B, HIV, systolic CHF/NICM s/p AICD, tobacco use disorder and cerebral aneurysm presenting with feeling unwell, dry cough, shortness of breath, congestion and chest pain with cough, and admitted with acute respiratory failure with hypoxia in the setting of COPD exacerbation.  Patient is followed by pulmonology in [place].  Desaturated to 85% on RA and started on supplemental oxygen.  She was also slightly tachycardic and tachypneic.  CT angio chest negative for PE but multiple pulmonary nodules.  COVID-19, influenza and RSV PCR negative.  Started on IV Solu-Medrol , scheduled and as needed nebulizers and antibiotics.   Subjective: Seen and examined earlier this morning.  No major events overnight of this morning.  Reports improvement in her breathing.  However, she desaturated to 87% with ambulation on room air requiring 2 L to recover to 90s.  Objective: Vitals:   12/04/23 0616 12/04/23 0835 12/04/23 0911 12/04/23 0912  BP: 137/88     Pulse: 80     Resp: 16     Temp: 98.3 F (36.8 C)     TempSrc: Oral     SpO2: 100% 98% (!) 88% 90%  Weight:      Height:        Examination:  GENERAL: No apparent distress.  Appears frail. HEENT: MMM.  Vision and hearing grossly intact.  NECK: Supple.  No apparent JVD.  RESP:  No IWOB.  Diminished aeration bilaterally. CVS:  RRR. Heart sounds normal.  ABD/GI/GU: BS+. Abd soft, NTND.  MSK/EXT:  Moves extremities.  Fuhs tenderness in the buttocks. SKIN: no apparent skin lesion or wound NEURO: Awake, alert and oriented appropriately.  No apparent focal neuro deficit. PSYCH: Calm. Normal affect.   Consultants:  None  Procedures: None  Microbiology  summarized: COVID-19, influenza and RSV PCR nonreactive  Assessment and plan: Acute respiratory failure with hypoxia due to COPD exacerbation: Current smoker.  Desaturated to 85% on RA requiring supplemental oxygen.  CT angio chest negative for PE but multiple pulmonary nodules.  Respiratory symptoms improved with treatment but desaturated to 87% with ambulation on room air. -Encouraged smoking cessation -Continue Solu-Medrol  to 80 mg daily -Continue scheduled and as needed inhalers/nebulizers -Wean oxygen as able.  Minimum oxygen to keep saturation above 88%. -Encourage incentive spirometry.  Daily ambulation for saturation assessment -Plan for discharge home on 5/12 with or without oxygen.  Hopefully she comes off oxygen.   Chronic systolic CHF/NICM s/p AICD: Appears euvolemic on exam.  TTE in 2021 with LVEF of 30%.  Followed by cardiologist, Dr. Raechel Bulla in [place].  Does not seem to be on medications for this.  Reportedly not able to afford Entresto.  Started on losartan here.  Appears euvolemic on exam. -Continue losartan-started here.  Consider changing to BiDil if creatinine continues to trend up -Discontinue amlodipine on discharge -Coreg started here on admission but discontinued due to COPD -Outpatient follow-up  Elevated troponins/atypical chest pain: Chest pain is with cough.  Mild troponin elevation without delta. -Mild COPD as above  CKD-3B: Relatively stable. Recent Labs    06/08/23 1053 12/02/23 0907 12/03/23 0439 12/04/23 0428  BUN 26* 17 21* 29*  CREATININE 1.59* 1.40* 1.25* 1.50*  - Continue monitoring - Consider changing  losartan to BiDil if creatinine trends up.  Lung nodules: Aware of these findings, following with pulmonary at [facility] -Outpatient follow-up with pulmonology -Encouraged smoking cessation  Tobacco use disorder: Reports smoking about 6 to 7 cigarettes a day.  Working on quitting. -Encouraged smoking cessation -Nicotine patch    HIV -Continue home meds  Secondary polycythemia: Resolved   Thrombocytopenia: Stable - Continue monitoring  Leukocytosis: Likely demargination from steroid   Severe protein calorie malnutrition Body mass index is 15.97 kg/m. -Dietitian consulted.          DVT prophylaxis:  enoxaparin (LOVENOX) injection 40 mg Start: 12/03/23 1000  Code Status: Full code Family Communication: None at bedside Level of care: Med-Surg Status is: Inpatient Remains inpatient appropriate because: Respiratory failure with hypoxia due to COPD exacerbation   Final disposition: Home on 5/12   55 minutes with more than 50% spent in reviewing records, counseling patient/family and coordinating care.   Sch Meds:  Scheduled Meds:  acetaminophen   650 mg Oral Q6H WA   arformoterol  15 mcg Nebulization BID   And   umeclidinium bromide  1 puff Inhalation Daily   dapsone  100 mg Oral Daily   darunavir-cobicistat  1 tablet Oral QHS   enoxaparin (LOVENOX) injection  40 mg Subcutaneous Q24H   feeding supplement  237 mL Oral BID BM   ipratropium-albuterol   3 mL Nebulization TID   losartan  25 mg Oral Daily   methylPREDNISolone  (SOLU-MEDROL ) injection  80 mg Intravenous Daily   nicotine  14 mg Transdermal Daily   rilpivirine  25 mg Oral Q breakfast   Continuous Infusions:  cefTRIAXone  (ROCEPHIN )  IV 1 g (12/03/23 1709)   PRN Meds:.albuterol , HYDROcodone -acetaminophen   Antimicrobials: Anti-infectives (From admission, onward)    Start     Dose/Rate Route Frequency Ordered Stop   12/03/23 0800  rilpivirine (EDURANT) tablet 25 mg        25 mg Oral Daily with breakfast 12/03/23 0051     12/02/23 2200  dapsone tablet 100 mg        100 mg Oral Daily 12/02/23 1530     12/02/23 2200  darunavir-cobicistat (PREZCOBIX) 800-150 MG per tablet 1 tablet       Note to Pharmacy: Swallow whole. Do NOT crush, break or chew tablets. Take with food.     1 tablet Oral Daily at bedtime 12/02/23 1530     12/02/23  1600  cefTRIAXone  (ROCEPHIN ) 1 g in sodium chloride  0.9 % 100 mL IVPB        1 g 200 mL/hr over 30 Minutes Intravenous Every 24 hours 12/02/23 1530          I have personally reviewed the following labs and images: CBC: Recent Labs  Lab 12/02/23 0907 12/03/23 0439 12/04/23 0428  WBC 10.1 16.0* 15.7*  NEUTROABS 7.8*  --   --   HGB 16.8* 14.9 13.2  HCT 51.0* 45.2 40.7  MCV 90.3 90.8 91.9  PLT 113* 106* 113*   BMP &GFR Recent Labs  Lab 12/02/23 0907 12/03/23 0439 12/04/23 0428  NA 139 139 136  K 3.8 3.9 4.7  CL 103 104 106  CO2 24 25 25   GLUCOSE 133* 136* 133*  BUN 17 21* 29*  CREATININE 1.40* 1.25* 1.50*  CALCIUM 9.2 8.8* 8.6*  MG  --   --  2.2  PHOS  --   --  2.9   Estimated Creatinine Clearance: 31.1 mL/min (A) (by C-G formula based on SCr of 1.5 mg/dL (H)).  Liver & Pancreas: Recent Labs  Lab 12/02/23 0907 12/04/23 0428  AST 20  --   ALT 10  --   ALKPHOS 144*  --   BILITOT 0.8  --   PROT 6.9  --   ALBUMIN 3.8 2.8*   No results for input(s): "LIPASE", "AMYLASE" in the last 168 hours. No results for input(s): "AMMONIA" in the last 168 hours. Diabetic: No results for input(s): "HGBA1C" in the last 72 hours. No results for input(s): "GLUCAP" in the last 168 hours. Cardiac Enzymes: No results for input(s): "CKTOTAL", "CKMB", "CKMBINDEX", "TROPONINI" in the last 168 hours. No results for input(s): "PROBNP" in the last 8760 hours. Coagulation Profile: No results for input(s): "INR", "PROTIME" in the last 168 hours. Thyroid Function Tests: No results for input(s): "TSH", "T4TOTAL", "FREET4", "T3FREE", "THYROIDAB" in the last 72 hours. Lipid Profile: No results for input(s): "CHOL", "HDL", "LDLCALC", "TRIG", "CHOLHDL", "LDLDIRECT" in the last 72 hours. Anemia Panel: No results for input(s): "VITAMINB12", "FOLATE", "FERRITIN", "TIBC", "IRON", "RETICCTPCT" in the last 72 hours. Urine analysis:    Component Value Date/Time   COLORURINE YELLOW (A) 06/08/2023  1220   APPEARANCEUR Hazy (A) 08/18/2023 0900   LABSPEC 1.015 06/08/2023 1220   PHURINE 7.0 06/08/2023 1220   GLUCOSEU Negative 08/18/2023 0900   GLUCOSEU NEG mg/dL 16/04/9603 5409   HGBUR LARGE (A) 06/08/2023 1220   BILIRUBINUR Negative 08/18/2023 0900   KETONESUR NEGATIVE 06/08/2023 1220   PROTEINUR 1+ (A) 08/18/2023 0900   PROTEINUR 100 (A) 06/08/2023 1220   UROBILINOGEN 0.2 04/22/2014 2209   NITRITE Negative 08/18/2023 0900   NITRITE NEGATIVE 06/08/2023 1220   LEUKOCYTESUR 2+ (A) 08/18/2023 0900   LEUKOCYTESUR MODERATE (A) 06/08/2023 1220   Sepsis Labs: Invalid input(s): "PROCALCITONIN", "LACTICIDVEN"  Microbiology: Recent Results (from the past 240 hours)  Resp panel by RT-PCR (RSV, Flu A&B, Covid) Anterior Nasal Swab     Status: None   Collection Time: 12/02/23  3:19 PM   Specimen: Anterior Nasal Swab  Result Value Ref Range Status   SARS Coronavirus 2 by RT PCR NEGATIVE NEGATIVE Final    Comment: (NOTE) SARS-CoV-2 target nucleic acids are NOT DETECTED.  The SARS-CoV-2 RNA is generally detectable in upper respiratory specimens during the acute phase of infection. The lowest concentration of SARS-CoV-2 viral copies this assay can detect is 138 copies/mL. A negative result does not preclude SARS-Cov-2 infection and should not be used as the sole basis for treatment or other patient management decisions. A negative result may occur with  improper specimen collection/handling, submission of specimen other than nasopharyngeal swab, presence of viral mutation(s) within the areas targeted by this assay, and inadequate number of viral copies(<138 copies/mL). A negative result must be combined with clinical observations, patient history, and epidemiological information. The expected result is Negative.  Fact Sheet for Patients:  BloggerCourse.com  Fact Sheet for Healthcare Providers:  SeriousBroker.it  This test is no t yet  approved or cleared by the [place]  FDA and  has been authorized for detection and/or diagnosis of SARS-CoV-2 by FDA under an Emergency Use Authorization (EUA). This EUA will remain  in effect (meaning this test can be used) for the duration of the COVID-19 declaration under Section 564(b)(1) of the Act, 21 U.S.C.section 360bbb-3(b)(1), unless the authorization is terminated  or revoked sooner.       Influenza A by PCR NEGATIVE NEGATIVE Final   Influenza B by PCR NEGATIVE NEGATIVE Final    Comment: (NOTE) The Xpert Xpress SARS-CoV-2/FLU/RSV plus assay is  intended as an aid in the diagnosis of influenza from Nasopharyngeal swab specimens and should not be used as a sole basis for treatment. Nasal washings and aspirates are unacceptable for Xpert Xpress SARS-CoV-2/FLU/RSV testing.  Fact Sheet for Patients: BloggerCourse.com  Fact Sheet for Healthcare Providers: SeriousBroker.it  This test is not yet approved or cleared by the [place]  FDA and has been authorized for detection and/or diagnosis of SARS-CoV-2 by FDA under an Emergency Use Authorization (EUA). This EUA will remain in effect (meaning this test can be used) for the duration of the COVID-19 declaration under Section 564(b)(1) of the Act, 21 U.S.C. section 360bbb-3(b)(1), unless the authorization is terminated or revoked.     Resp Syncytial Virus by PCR NEGATIVE NEGATIVE Final    Comment: (NOTE) Fact Sheet for Patients: BloggerCourse.com  Fact Sheet for Healthcare Providers: SeriousBroker.it  This test is not yet approved or cleared by the [place]  FDA and has been authorized for detection and/or diagnosis of SARS-CoV-2 by FDA under an Emergency Use Authorization (EUA). This EUA will remain in effect (meaning this test can be used) for the duration of the COVID-19 declaration under Section 564(b)(1) of  the Act, 21 U.S.C. section 360bbb-3(b)(1), unless the authorization is terminated or revoked.  Performed at [facility], [address] [place], [place] [address]     Radiology Studies: No results found.    Cassey Bacigalupo T. Jerelyn Trimarco [facility]  If 7PM-7AM, please contact night-coverage [email or web address] 12/04/2023, 12:13 PM

## 2023-12-04 NOTE — Plan of Care (Signed)
   Problem: Education: Goal: Knowledge of General Education information will improve Description Including pain rating scale, medication(s)/side effects and non-pharmacologic comfort measures Outcome: Progressing   Problem: Health Behavior/Discharge Planning: Goal: Ability to manage health-related needs will improve Outcome: Progressing

## 2023-12-04 NOTE — Progress Notes (Signed)
 Initial Nutrition Assessment  DOCUMENTATION CODES:   Not applicable  INTERVENTION:  Measure Weight Multivitamin w/ minerals daily Ensure Enlive po BID, each supplement provides 350 kcal and 20 grams of protein. Encourage good PO intake  NUTRITION DIAGNOSIS:   Increased nutrient needs related to chronic illness (COPD, CHF, HIV) as evidenced by estimated needs.  GOAL:   Patient will meet greater than or equal to 90% of their needs  MONITOR:   PO intake, Weight trends, Supplement acceptance, Labs, I & O's  REASON FOR ASSESSMENT:   Consult Assessment of nutrition requirement/status  ASSESSMENT:   58 y.o. female presented to the ED with SOB, cough and congestion. PNH includes COPD, HIV, CHF, HLD, HTN, and CKD III. Pt admitted with COPD exacerbation.   RD working remotely at time of assessment. RD able to reach pt via phone in room.   Pt reports that she has been eating good currently and PTA. Reports typically eating 1-2 meals per day, just depends. Does drink ONS occasionally at home and is drinking them at the hospital. Pt denies any nausea or vomiting. RD encouraged pt to continue to drink ONS while admitted.   Meal Intakes  5/10: 100% x 2 meals 5/11: 100% x 1 meal  Per EMR, pt with no weight loss over the past year. Although, suspect that current weight is pulled from previous visits for multiple years. Would recommend obtaining standing scale weight to obtain an accurate weight.  Pt reports a UBW of 100-105#, denies any significant weight changes, just minor fluctuation.   Nutrition Related Medications: Solu-Medrol , IV antibiotics  Labs reviewed.     NUTRITION - FOCUSED PHYSICAL EXAM:  Deferred to follow-up.   Diet Order:   Diet Order             Diet - low sodium heart healthy           Diet regular Room service appropriate? Yes; Fluid consistency: Thin  Diet effective now                   EDUCATION NEEDS:   No education needs have been identified  at this time  Skin:  Skin Assessment: Reviewed RN Assessment  Last BM:  Unknown  Height:  Ht Readings from Last 1 Encounters:  12/02/23 5\' 8"  (1.727 m)   Weight:  Wt Readings from Last 1 Encounters:  12/02/23 47.6 kg   Ideal Body Weight:  63.6 kg  BMI:  Body mass index is 15.97 kg/m.  Estimated Nutritional Needs:  Kcal:  1600-1800 Protein:  75-95 grams Fluid:  >/= 1.6 L   Doneta Furbish RD, LDN Clinical Dietitian

## 2023-12-05 DIAGNOSIS — J4551 Severe persistent asthma with (acute) exacerbation: Principal | ICD-10-CM

## 2023-12-05 DIAGNOSIS — R918 Other nonspecific abnormal finding of lung field: Secondary | ICD-10-CM

## 2023-12-05 DIAGNOSIS — J9601 Acute respiratory failure with hypoxia: Secondary | ICD-10-CM | POA: Diagnosis not present

## 2023-12-05 DIAGNOSIS — I10 Essential (primary) hypertension: Secondary | ICD-10-CM | POA: Diagnosis not present

## 2023-12-05 LAB — CBC
HCT: 40.7 % (ref 36.0–46.0)
Hemoglobin: 13.3 g/dL (ref 12.0–15.0)
MCH: 29.8 pg (ref 26.0–34.0)
MCHC: 32.7 g/dL (ref 30.0–36.0)
MCV: 91.1 fL (ref 80.0–100.0)
Platelets: 132 10*3/uL — ABNORMAL LOW (ref 150–400)
RBC: 4.47 MIL/uL (ref 3.87–5.11)
RDW: 14.3 % (ref 11.5–15.5)
WBC: 14.6 10*3/uL — ABNORMAL HIGH (ref 4.0–10.5)
nRBC: 0 % (ref 0.0–0.2)

## 2023-12-05 LAB — RENAL FUNCTION PANEL
Albumin: 2.7 g/dL — ABNORMAL LOW (ref 3.5–5.0)
Anion gap: 5 (ref 5–15)
BUN: 32 mg/dL — ABNORMAL HIGH (ref 6–20)
CO2: 25 mmol/L (ref 22–32)
Calcium: 8.3 mg/dL — ABNORMAL LOW (ref 8.9–10.3)
Chloride: 107 mmol/L (ref 98–111)
Creatinine, Ser: 1.43 mg/dL — ABNORMAL HIGH (ref 0.44–1.00)
GFR, Estimated: 43 mL/min — ABNORMAL LOW (ref >60)
Glucose, Bld: 127 mg/dL — ABNORMAL HIGH (ref 70–99)
Phosphorus: 2.5 mg/dL (ref 2.5–4.6)
Potassium: 3.8 mmol/L (ref 3.5–5.1)
Sodium: 137 mmol/L (ref 135–145)

## 2023-12-05 LAB — MAGNESIUM: Magnesium: 2.2 mg/dL (ref 1.7–2.4)

## 2023-12-05 MED ORDER — ISOSORBIDE MONONITRATE ER 30 MG PO TB24: 15.0000 mg | ORAL_TABLET | Freq: Every day | ORAL | 2 refills | Status: DC

## 2023-12-05 MED ORDER — IPRATROPIUM-ALBUTEROL 0.5-2.5 (3) MG/3ML IN SOLN: 3.0000 mL | Freq: Four times a day (QID) | RESPIRATORY_TRACT | 1 refills | Status: AC | PRN

## 2023-12-05 MED ORDER — HYDRALAZINE HCL 50 MG PO TABS
50.0000 mg | ORAL_TABLET | Freq: Three times a day (TID) | ORAL | 1 refills | Status: DC
Start: 2023-12-05 — End: 2024-03-17

## 2023-12-05 MED ORDER — ADULT MULTIVITAMIN W/MINERALS CH: 1.0000 | ORAL_TABLET | Freq: Every day | ORAL | 1 refills | Status: DC

## 2023-12-05 NOTE — Discharge Summary (Signed)
 Physician Discharge Summary   Patient: Claire Day MRN: [id number] DOB: 10/28/1965  Admit date:     12/02/2023  Discharge date: 12/05/23  Discharge Physician: Luna Salinas   PCP: Tressie Fryer, MD   Recommendations at discharge:  Please obtain CBC and BMP on follow-up Follow-up with primary care provider Follow-up with pulmonology  Discharge Diagnoses: Principal Problem:   Acute respiratory failure with hypoxia [facility]) Active Problems:   HIV DISEASE   TOBACCO USER   Essential hypertension   Severe persistent asthma with acute exacerbation   Pulmonary nodules   Hospital Course: 58 year old F with PMH of COPD, CKD-3B, HIV, systolic CHF/NICM s/p AICD, tobacco use disorder and cerebral aneurysm presenting with feeling unwell, dry cough, shortness of breath, congestion and chest pain with cough, and admitted with acute respiratory failure with hypoxia in the setting of COPD exacerbation. Patient is followed by pulmonology in [place]. Desaturated to 85% on RA and started on supplemental oxygen. She was also slightly tachycardic and tachypneic. CT angio chest negative for PE but multiple pulmonary nodules. COVID-19, influenza and RSV PCR negative. Started on IV Solu-Medrol , scheduled and as needed nebulizers and antibiotics.   Patient initially received Solu-Medrol  followed by prednisone .  She was able to wean back to room air.  She was also given a prescription for nebulizer and DuoNeb to use at home as needed.  Patient has an history of HFrEF with EF of 30%, she followed up with cardiology as outpatient and appears euvolemic and well compensated during current hospitalization.  She will continue her home medications and follow-up with her cardiologist for further assistance regarding HFrEF.  Patient is well aware of multiple lung nodule and follow-up with pulmonary at [facility] which she will continue.  She was again counseled for smoking cessation.  She was provided with nicotine patch  to help.  Patient was also found to have severe protein caloric malnutrition and need to continue with her supplements to help.  Patient will continue on current medications and follow-up with her providers for further assistance.      Consultants: None Procedures performed: None Disposition: Home Diet recommendation:  Discharge Diet Orders (From admission, onward)     Start     Ordered   12/04/23 0000  Diet - low sodium heart healthy        12/04/23 0821           Cardiac diet DISCHARGE MEDICATION: Allergies as of 12/05/2023       Reactions   Sulfonamide Derivatives Hives, Dermatitis   Tramadol Other (See Comments)   Upset stomach and it "gave her the shakes"         Medication List     STOP taking these medications    amLODipine 5 MG tablet Commonly known as: NORVASC       TAKE these medications    albuterol  108 (90 Base) MCG/ACT inhaler Commonly known as: VENTOLIN  HFA Inhale 2 puffs into the lungs every 4 (four) hours as needed for wheezing or shortness of breath.   dapsone 100 MG tablet Take 100 mg by mouth at bedtime.   darunavir-cobicistat 800-150 MG tablet Commonly known as: PREZCOBIX Take 1 tablet by mouth at bedtime. Swallow whole. Do NOT crush, break or chew tablets. Take with food.   hydrALAZINE 50 MG tablet Commonly known as: APRESOLINE Take 1 tablet (50 mg total) by mouth every 8 (eight) hours.   ipratropium-albuterol  0.5-2.5 (3) MG/3ML Soln Commonly known as: DUONEB Take 3 mLs by nebulization every 6 (  six) hours as needed (Shortness of breath and wheezing).   isosorbide mononitrate 30 MG 24 hr tablet Commonly known as: IMDUR Take 0.5 tablets (15 mg total) by mouth daily. Start taking on: Dec 06, 2023   losartan 25 MG tablet Commonly known as: COZAAR Take 1 tablet (25 mg total) by mouth daily.   multivitamin with minerals Tabs tablet Take 1 tablet by mouth daily. Start taking on: Dec 06, 2023   nicotine 14 mg/24hr  patch Commonly known as: NICODERM CQ - dosed in mg/24 hours Place 1 patch (14 mg total) onto the skin daily.   predniSONE  50 MG tablet Commonly known as: DELTASONE  Take 1 tablet (50 mg total) by mouth daily for 3 days.   rilpivirine 25 MG Tabs tablet Commonly known as: EDURANT Take 25 mg by mouth at bedtime.   Stiolto Respimat 2.5-2.5 MCG/ACT Aers Generic drug: Tiotropium Bromide-Olodaterol Inhale 2 puffs into the lungs daily as needed (wheezing, shortness of breath).               Durable Medical Equipment  (From admission, onward)           Start     Ordered   12/05/23 1035  For home use only DME Nebulizer machine  Once       Question Answer Comment  Patient needs a nebulizer to treat with the following condition COPD exacerbation (HCC)   Length of Need Lifetime   Additional equipment included Administration kit   Additional equipment included Filter      12/05/23 1034            Follow-up Information     Tressie Fryer, MD. Schedule an appointment as soon as possible for a visit in 1 week(s).   Specialties: Internal Medicine, Infectious Diseases Contact information: [facility] [address] [place] [address] [phone]                Discharge Exam: Claire Day Weights   12/02/23 0857 12/05/23 0500  Weight: 47.6 kg 46.2 kg   General.  Severely malnourished lady, in no acute distress. Pulmonary.  Lungs clear bilaterally, normal respiratory effort. CV.  Regular rate and rhythm, no JVD, rub or murmur. Abdomen.  Soft, nontender, nondistended, BS positive. CNS.  Alert and oriented .  No focal neurologic deficit. Extremities.  No edema, no cyanosis, pulses intact and symmetrical. Psychiatry.  Judgment and insight appears normal.   Condition at discharge: stable  The results of significant diagnostics from this hospitalization (including imaging, microbiology, ancillary and laboratory) are listed below for reference.    Imaging Studies: CT Angio Chest PE W and/or Wo Contrast Result Date: 12/02/2023 CLINICAL DATA:  Pulmonary embolism (PE) suspected, high prob. Shortness of breath. Cough. EXAM: CT ANGIOGRAPHY CHEST WITH CONTRAST TECHNIQUE: Multidetector CT imaging of the chest was performed using the standard protocol during bolus administration of intravenous contrast. Multiplanar CT image reconstructions and MIPs were obtained to evaluate the vascular anatomy. RADIATION DOSE REDUCTION: This exam was performed according to the departmental dose-optimization program which includes automated exposure control, adjustment of the mA and/or kV according to patient size and/or use of iterative reconstruction technique. CONTRAST:  60mL OMNIPAQUE  IOHEXOL  350 MG/ML SOLN COMPARISON:  CT scan chest from 01/06/2004. FINDINGS: Cardiovascular: No evidence of embolism to the proximal subsegmental pulmonary artery level. Normal cardiac size. No pericardial effusion. No aortic aneurysm. There are coronary artery calcifications, in keeping with coronary artery disease. There are also mild peripheral atherosclerotic vascular calcifications of thoracic aorta  and its major branches. Mediastinum/Nodes: Visualized thyroid gland appears grossly unremarkable. No solid / cystic mediastinal masses. The esophagus is nondistended precluding optimal assessment. No axillary, mediastinal or hilar lymphadenopathy by size criteria. Lungs/Pleura: The central tracheo-bronchial tree is patent. There is mild, smooth, circumferential thickening of the segmental and subsegmental bronchial walls, throughout bilateral lungs, which is nonspecific. Findings are most commonly seen with bronchitis or reactive airway disease, such as asthma. Diffuse mild-to-moderate upper lobe predominant centrilobular emphysematous changes noted. There is a spiculated marginated 6 x 9 mm solid, noncalcified nodule in the right lung upper lobe (series 6, image 54). There are also multiple  part solid nodules throughout bilateral lungs (marked with electronic arrow sign on series 6), with largest in the middle lobe measuring up to 2.2 x 2.8 cm. There are additional peripheral areas of scarring/atelectasis throughout bilateral lungs, mainly in the lower lobes. No consolidation, pleural effusion or pneumothorax. Upper Abdomen: Surgically absent gallbladder. There multiple punctate calcifications in the spleen, likely sequela of prior granulomatous infection. There are at least 2, 5 mm or smaller nonobstructing calculi in bilateral kidneys. Remaining visualized upper abdominal viscera within normal limits. Musculoskeletal: A triple lead cardiac pacemaker / defibrillator is noted with its battery pack overlying the left chest wall and the leads terminating in the right atrium, apex of right ventricle and coronary sinus. Visualized soft tissues of the chest wall are otherwise grossly unremarkable. No suspicious osseous lesions. There are mild multilevel degenerative changes in the visualized spine. Review of the MIP images confirms the above findings. IMPRESSION: 1. No embolism to the proximal subsegmental pulmonary artery level. 2. Multiple part solid nodules throughout bilateral lungs with largest in the middle lobe measuring up to 2.2 x 2.8 cm. There is also a spiculated marginated 6 x 9 mm solid noncalcified nodule in the right lung upper lobe. Please see follow-up recommendations below. 3. Multiple other nonacute observations, as described above. Pulmonary nodule follow-up recommendation: Part-solid lung nodule more than or equal to 6 mm: - CT at 3-6 months to confirm persistence. If unchanged and solid component remains < 6 mm, annual CT should be performed for 5 years. Recommendations for evaluation of incidental nodules derived from guidelines developed by the Fleischner Society ( Radiology 2017; [id number]). These guidelines apply to incidental nodules, which can be managed according to the  specific recommendations. These guidelines do not apply to patient's younger than 35 years, immunocompromised patients, or patients with cancer. For lung cancer screening, adherence to the existing [employer] of radiology lung CT Screening Reporting and Data System (lung-RADS) guidelines is recommended. High risk factors include older age, heavy smoking, larger nodule size, irregular or spiculated margins and upper lobe location. Clinical risk factors include smoking, exposure to other carcinogens, emphysema, fibrosis, upper lobe location, family history of lung cancer, age and sex. Electronically Signed   By: Beula Brunswick M.D.   On: 12/02/2023 10:52   DG Chest Port 1 View Result Date: 12/02/2023 CLINICAL DATA:  cp dib. EXAM: PORTABLE CHEST 1 VIEW COMPARISON:  11/14/2021. FINDINGS: Bilateral lungs appear hyperexpanded and hyperlucent with coarse bronchovascular markings, concerning for underlying COPD. Bilateral lungs otherwise appear clear. No dense consolidation or lung collapse. Bilateral costophrenic angles are clear. Normal cardio-mediastinal silhouette. There is a left sided 3-lead pacemaker. No acute osseous abnormalities. The soft tissues are within normal limits. IMPRESSION: *No active disease. Probable COPD. Electronically Signed   By: Beula Brunswick M.D.   On: 12/02/2023 09:45    Microbiology: Results for orders placed  or performed during the hospital encounter of 12/02/23  Resp panel by RT-PCR (RSV, Flu A&B, Covid) Anterior Nasal Swab     Status: None   Collection Time: 12/02/23  3:19 PM   Specimen: Anterior Nasal Swab  Result Value Ref Range Status   SARS Coronavirus 2 by RT PCR NEGATIVE NEGATIVE Final    Comment: (NOTE) SARS-CoV-2 target nucleic acids are NOT DETECTED.  The SARS-CoV-2 RNA is generally detectable in upper respiratory specimens during the acute phase of infection. The lowest concentration of SARS-CoV-2 viral copies this assay can detect is 138 copies/mL. A  negative result does not preclude SARS-Cov-2 infection and should not be used as the sole basis for treatment or other patient management decisions. A negative result may occur with  improper specimen collection/handling, submission of specimen other than nasopharyngeal swab, presence of viral mutation(s) within the areas targeted by this assay, and inadequate number of viral copies(<138 copies/mL). A negative result must be combined with clinical observations, patient history, and epidemiological information. The expected result is Negative.  Fact Sheet for Patients:  BloggerCourse.com  Fact Sheet for Healthcare Providers:  SeriousBroker.it  This test is no t yet approved or cleared by the [place]  FDA and  has been authorized for detection and/or diagnosis of SARS-CoV-2 by FDA under an Emergency Use Authorization (EUA). This EUA will remain  in effect (meaning this test can be used) for the duration of the COVID-19 declaration under Section 564(b)(1) of the Act, 21 U.S.C.section 360bbb-3(b)(1), unless the authorization is terminated  or revoked sooner.       Influenza A by PCR NEGATIVE NEGATIVE Final   Influenza B by PCR NEGATIVE NEGATIVE Final    Comment: (NOTE) The Xpert Xpress SARS-CoV-2/FLU/RSV plus assay is intended as an aid in the diagnosis of influenza from Nasopharyngeal swab specimens and should not be used as a sole basis for treatment. Nasal washings and aspirates are unacceptable for Xpert Xpress SARS-CoV-2/FLU/RSV testing.  Fact Sheet for Patients: BloggerCourse.com  Fact Sheet for Healthcare Providers: SeriousBroker.it  This test is not yet approved or cleared by the [place]  FDA and has been authorized for detection and/or diagnosis of SARS-CoV-2 by FDA under an Emergency Use Authorization (EUA). This EUA will remain in effect (meaning this test can  be used) for the duration of the COVID-19 declaration under Section 564(b)(1) of the Act, 21 U.S.C. section 360bbb-3(b)(1), unless the authorization is terminated or revoked.     Resp Syncytial Virus by PCR NEGATIVE NEGATIVE Final    Comment: (NOTE) Fact Sheet for Patients: BloggerCourse.com  Fact Sheet for Healthcare Providers: SeriousBroker.it  This test is not yet approved or cleared by the [place]  FDA and has been authorized for detection and/or diagnosis of SARS-CoV-2 by FDA under an Emergency Use Authorization (EUA). This EUA will remain in effect (meaning this test can be used) for the duration of the COVID-19 declaration under Section 564(b)(1) of the Act, 21 U.S.C. section 360bbb-3(b)(1), unless the authorization is terminated or revoked.  Performed at [facility], [address], [place], [place] [address]     Labs: CBC: Recent Labs  Lab 12/02/23 [phone] 12/03/23 0439 12/04/23 0428 12/05/23 0409  WBC 10.1 16.0* 15.7* 14.6*  NEUTROABS 7.8*  --   --   --   HGB 16.8* 14.9 13.2 13.3  HCT 51.0* 45.2 40.7 40.7  MCV 90.3 90.8 91.9 91.1  PLT 113* 106* 113* 132*   Basic Metabolic Panel: Recent Labs  Lab 12/02/23 0907 12/03/23 0439 12/04/23  0428 12/05/23 0409  NA 139 139 136 137  K 3.8 3.9 4.7 3.8  CL 103 104 106 107  CO2 24 25 25 25   GLUCOSE 133* 136* 133* 127*  BUN 17 21* 29* 32*  CREATININE 1.40* 1.25* 1.50* 1.43*  CALCIUM 9.2 8.8* 8.6* 8.3*  MG  --   --  2.2 2.2  PHOS  --   --  2.9 2.5   Liver Function Tests: Recent Labs  Lab 12/02/23 0907 12/04/23 0428 12/05/23 0409  AST 20  --   --   ALT 10  --   --   ALKPHOS 144*  --   --   BILITOT 0.8  --   --   PROT 6.9  --   --   ALBUMIN 3.8 2.8* 2.7*   CBG: No results for input(s): "GLUCAP" in the last 168 hours.  Discharge time spent: greater than 30 minutes.  This record has been created using Conservation officer, historic buildings. Errors have  been sought and corrected,but may not always be located. Such creation errors do not reflect on the standard of care.   Signed: Luna Salinas, MD [facility] 12/05/2023

## 2023-12-05 NOTE — Care Management Important Message (Signed)
 Important Message  Patient Details  Name: Claire Day MRN: [id number] Date of Birth: [birth year]   Important Message Given:  N/A - LOS <3 / Initial given by admissions     Neila Bally 12/05/2023, 11:48 AM

## 2023-12-05 NOTE — Progress Notes (Signed)
SATURATION QUALIFICATIONS: (This note is used to comply with regulatory documentation for home oxygen) ° °Patient Saturations on Room Air at Rest = 90% ° °Patient Saturations on Room Air while Ambulating = 89% ° ° °

## 2023-12-05 NOTE — TOC Transition Note (Signed)
 Transition of Care [facility]) - Discharge Note   Patient Details  Name: Claire Day MRN: [id number] Date of Birth: June 04, 1966  Transition of Care [facility]) CM/SW Contact:  Ander Katos, LCSW Phone Number: 12/05/2023, 10:40 AM   Clinical Narrative: Pt d/c today. Will need nebulizer. Discussed with pt who has no preference on agency. Referred to Zach with [employer] to deliver to room today. Pt and RN aware. No other needs reported.        Final next level of care: Home/Self Care Barriers to Discharge: Continued Medical Work up   Patient Goals and CMS Choice Patient states their goals for this hospitalization and ongoing recovery are:: Home          Discharge Placement                    Patient and family notified of of transfer: 12/05/23  Discharge Plan and Services Additional resources added to the After Visit Summary for                  DME Arranged: Nebulizer machine DME Agency: [employer] Date DME Agency Contacted: 12/05/23 Time DME Agency Contacted: 1040 Representative spoke with at DME Agency: Gladys Lamp            Social Drivers of Health (SDOH) Interventions SDOH Screenings   Food Insecurity: No Food Insecurity (12/02/2023)  Housing: High Risk (12/02/2023)  Transportation Needs: No Transportation Needs (12/02/2023)  Utilities: Not At Risk (12/02/2023)  Tobacco Use: High Risk (12/02/2023)     Readmission Risk Interventions    12/02/2023    9:43 PM  Readmission Risk Prevention Plan  Post Dischage Appt Complete  Medication Screening Complete  Transportation Screening Complete

## 2024-02-16 ENCOUNTER — Ambulatory Visit
Admission: RE | Admit: 2024-02-16 | Discharge: 2024-02-16 | Disposition: A | Discharge status: Home / Self Care | Source: Ambulatory Visit | Attending: Urology | Admitting: Urology

## 2024-02-16 ENCOUNTER — Encounter: Payer: Self-pay | Admitting: Urology

## 2024-02-16 ENCOUNTER — Ambulatory Visit: Payer: Self-pay | Admitting: Urology

## 2024-02-16 VITALS — BP 168/108 | HR 76 | Ht 67.0 in | Wt 93.4 lb

## 2024-02-16 DIAGNOSIS — R310 Gross hematuria: Secondary | ICD-10-CM | POA: Diagnosis not present

## 2024-02-16 DIAGNOSIS — Z466 Encounter for fitting and adjustment of urinary device: Secondary | ICD-10-CM | POA: Insufficient documentation

## 2024-02-16 DIAGNOSIS — N200 Calculus of kidney: Secondary | ICD-10-CM

## 2024-02-16 LAB — URINALYSIS, COMPLETE
Bilirubin, UA: NEGATIVE
Glucose, UA: NEGATIVE
Ketones, UA: NEGATIVE
Leukocytes,UA: NEGATIVE
Nitrite, UA: NEGATIVE
Protein,UA: NEGATIVE
Specific Gravity, UA: 1.015 (ref 1.005–1.030)
Urobilinogen, Ur: 0.2 mg/dL (ref 0.2–1.0)
pH, UA: 6 (ref 5.0–7.5)

## 2024-02-16 LAB — MICROSCOPIC EXAMINATION: RBC, Urine: >30 /HPF — AB (ref 0–2)

## 2024-02-16 NOTE — Patient Instructions (Signed)
 Scheduling for ultrasound: [phone]

## 2024-02-16 NOTE — Progress Notes (Signed)
 I, Maysun LITTIE Griffiths, acting as a scribe for Glendia JAYSON Barba, MD., have documented all relevant documentation on the behalf of Glendia JAYSON Barba, MD, as directed by Glendia JAYSON Barba, MD while in the presence of Glendia JAYSON Barba, MD.  02/16/2024 5:57 PM   Claire Day 1965-11-26 [id number]  Referring provider: Meade Bigness, MD [facility] [address] [place],  [place] [address]  Chief Complaint  Patient presents with   Follow-up    HPI: Claire Day is a 58 y.o. female presents for a 6 month follow-up.  States she had an episode of total gross painless hematuria approximately 2 weeks ago describing urine looking like a pure blood. She had no flank, abdominal, or pelvic pain, or a dysuria, frequency, urgency, and states this occurred only 1 time.  She does not think it was vaginal bleeding.   PMH: Past Medical History:  Diagnosis Date   Allergic rhinitis    Anemia    Aortic insufficiency    Cerebral aneurysm 02/12/2023   a.) CT head 02/12/2023:  3 x 3 mm distal LEFT M1 MCA just proximal to the bifurcation   Chronic systolic heart failure (HCC)    CKD (chronic kidney disease), stage III (HCC)    COPD (chronic obstructive pulmonary disease) (HCC)    History of kidney stones    HIV (human immunodeficiency virus infection) (HCC) 2003   a.) Dx'd in 2003; b.) currently Tx'd with darunavir -cobicistat  + rilpivirine    HLD (hyperlipidemia)    Hydronephrosis    Hypertension    LBBB (left bundle branch block)    MI (myocardial infarction) (HCC)    Moderate mitral regurgitation    Nephrolithiasis    NICM (nonischemic cardiomyopathy) (HCC)    Pneumonia due to Streptococcus (HCC)    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   a.) Medtronic device placed 09/20/2017 --> CLARIA MRI CRT-D QUAD DF-4 (SN: [id number])   Right upper lobe pulmonary nodule 02/12/2023   a.) CT imaging 02/12/2023:  9 x 7 mm   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    Thrombocytopenia (HCC)    TIA (transient ischemic attack) 02/12/2023   Tobacco abuse     Surgical History: Past Surgical History:  Procedure Laterality Date   BI-VENTRICULAR IMPLANTABLE CARDIOVERTER DEFIBRILLATOR  (CRT-D) Left 09/20/2017   Procedure: BI-VENTRICULAR IMPLANTABLE CARDIOVERTER DEFIBRILLATOR (CRT-D); Location: [facility]; Surgeon: Nancyann Ceo, MD   CHOLECYSTECTOMY     CYSTOSCOPY W/ URETERAL STENT PLACEMENT Left 06/10/2023   Procedure: CYSTOSCOPY WITH RETROGRADE PYELOGRAM/URETERAL STENT PLACEMENT;  Surgeon: Francisca Redell JAYSON, MD;  Location: [facility];  Service: Urology;  Laterality: Left;   CYSTOSCOPY/URETEROSCOPY/HOLMIUM LASER/STENT PLACEMENT Left 07/05/2023   Procedure: CYSTOSCOPY/URETEROSCOPY/HOLMIUM LASER/STENT EXCHANGE;  Surgeon: Barba Glendia JAYSON, MD;  Location: [facility];  Service: Urology;  Laterality: Left;    Home Medications:  Allergies as of 02/16/2024       Reactions   Sulfonamide Derivatives Hives, Dermatitis   Tramadol Other (See Comments)   Upset stomach and it gave her the shakes         Medication List        Accurate as of February 16, 2024  5:57 PM. If you have any questions, ask your nurse or doctor.          albuterol  108 (90 Base) MCG/ACT inhaler Commonly known as: VENTOLIN  HFA Inhale 2 puffs into the lungs every 4 (four) hours as needed for wheezing or shortness of breath.   dapsone   100 MG tablet Take 100 mg by mouth at bedtime.   darunavir -cobicistat  800-150 MG tablet Commonly known as: PREZCOBIX  Take 1 tablet by mouth at bedtime. Swallow whole. Do NOT crush, break or chew tablets. Take with food.   hydrALAZINE  50 MG tablet Commonly known as: APRESOLINE  Take 1 tablet (50 mg total) by mouth every 8 (eight) hours.   ipratropium-albuterol  0.5-2.5 (3) MG/3ML Soln Commonly known as: DUONEB Take 3 mLs by nebulization every 6 (six) hours as needed (Shortness of breath and wheezing).   isosorbide  mononitrate 30 MG  24 hr tablet Commonly known as: IMDUR  Take 0.5 tablets (15 mg total) by mouth daily.   losartan  25 MG tablet Commonly known as: COZAAR  Take 1 tablet (25 mg total) by mouth daily.   multivitamin with minerals Tabs tablet Take 1 tablet by mouth daily.   nicotine  14 mg/24hr patch Commonly known as: NICODERM CQ  - dosed in mg/24 hours Place 1 patch (14 mg total) onto the skin daily.   rilpivirine  25 MG Tabs tablet Commonly known as: EDURANT  Take 25 mg by mouth at bedtime.   Stiolto Respimat 2.5-2.5 MCG/ACT Aers Generic drug: Tiotropium Bromide-Olodaterol Inhale 2 puffs into the lungs daily as needed (wheezing, shortness of breath).        Allergies:  Allergies  Allergen Reactions   Sulfonamide Derivatives Hives and Dermatitis   Tramadol Other (See Comments)    Upset stomach and it gave her the shakes     Social History:  reports that she has been smoking cigarettes. She does not have any smokeless tobacco history on file. She reports that she does not drink alcohol and does not use drugs.   Physical Exam: BP (!) 168/108   Pulse 76   Ht 5' 7 (1.702 m)   Wt 93 lb 7 oz (42.4 kg)   BMI 14.63 kg/m   Constitutional:  Alert and oriented, No acute distress. HEENT: [place] AT, moist mucus membranes.  Trachea midline, no masses. Cardiovascular: No clubbing, cyanosis, or edema. Respiratory: Normal respiratory effort, no increased work of breathing. GI: Abdomen is soft, nontender, nondistended, no abdominal masses Skin: No rashes, bruises or suspicious lesions. Neurologic: Grossly intact, no focal deficits, moving all 4 extremities. Psychiatric: Normal mood and affect.   Pertinent Imaging: KUB performed today was personally reviewed and interpretive. Moderate amount of stool and bowel gas present. There are calcifications overlying the right upper quadrant, consistent with cholelithiasis. Faint calcification overlying the left renal outline, which could potentially represent  renal calculus.   Assessment & Plan:    1. Recurrent nephrothiasis Possible left renal calculus  2. Gross hematuria Recent episode of gross hematuria UA today with persistent microhematuria, >30 RBC Recommend further evaluation with CT urogram and cystoscopy.  I have reviewed the above documentation for accuracy and completeness, and I agree with the above.   Glendia JAYSON Barba, MD  [facility] [address] [place], [place] [address] [phone]

## 2024-02-17 ENCOUNTER — Ambulatory Visit: Payer: Self-pay | Admitting: Urology

## 2024-02-21 ENCOUNTER — Ambulatory Visit
Admission: RE | Admit: 2024-02-21 | Discharge: 2024-02-21 | Disposition: A | Discharge status: Home / Self Care | Source: Ambulatory Visit | Attending: Urology | Admitting: Urology

## 2024-02-21 DIAGNOSIS — N200 Calculus of kidney: Secondary | ICD-10-CM | POA: Diagnosis present

## 2024-02-22 ENCOUNTER — Telehealth: Payer: Self-pay

## 2024-02-22 DIAGNOSIS — R310 Gross hematuria: Secondary | ICD-10-CM

## 2024-02-22 NOTE — Telephone Encounter (Signed)
 Pt also set up for cysto

## 2024-02-22 NOTE — Telephone Encounter (Signed)
 Patient returned call for results and we went ahead set up her appt for a cysto and she is going to schedule her CT.

## 2024-02-22 NOTE — Telephone Encounter (Signed)
 Patient returned call and was able to set up for CT

## 2024-02-29 ENCOUNTER — Ambulatory Visit
Admission: RE | Admit: 2024-02-29 | Discharge: 2024-02-29 | Disposition: A | Discharge status: Home / Self Care | Source: Ambulatory Visit | Attending: Urology | Admitting: Urology

## 2024-02-29 DIAGNOSIS — R310 Gross hematuria: Secondary | ICD-10-CM | POA: Insufficient documentation

## 2024-02-29 MED ORDER — IOHEXOL 300 MG/ML  SOLN
100.0000 mL | Freq: Once | INTRAMUSCULAR | Status: AC | PRN
  Administered 2024-02-29: 100 mL via INTRAVENOUS

## 2024-03-14 ENCOUNTER — Other Ambulatory Visit: Admitting: Urology

## 2024-03-16 ENCOUNTER — Ambulatory Visit: Admitting: Urology

## 2024-03-16 VITALS — BP 129/86 | HR 109 | Ht 67.0 in | Wt 100.0 lb

## 2024-03-16 DIAGNOSIS — R310 Gross hematuria: Secondary | ICD-10-CM | POA: Diagnosis not present

## 2024-03-16 DIAGNOSIS — N200 Calculus of kidney: Secondary | ICD-10-CM

## 2024-03-16 LAB — URINALYSIS, COMPLETE
Bilirubin, UA: NEGATIVE
Glucose, UA: NEGATIVE
Ketones, UA: NEGATIVE
Nitrite, UA: NEGATIVE
Specific Gravity, UA: 1.015 (ref 1.005–1.030)
Urobilinogen, Ur: 2 mg/dL — ABNORMAL HIGH (ref 0.2–1.0)
pH, UA: 7 (ref 5.0–7.5)

## 2024-03-16 LAB — MICROSCOPIC EXAMINATION: RBC, Urine: >30 /HPF — AB (ref 0–2)

## 2024-03-17 NOTE — Progress Notes (Signed)
   03/17/24  CC:  Chief Complaint  Patient presents with   Cysto    HPI: Refer to my prior note 02/16/2024.  Denies recurrent hematuria.  CT urogram 02/29/2024 remarkable for bilateral nonobstructing renal calculi up to 8 mm on the right and 6 mm on the left.  Blood pressure 129/86, pulse (!) 109, height 5' 7 (1.702 m), weight 100 lb (45.4 kg), SpO2 96%. NED. A&Ox3.   No respiratory distress   Abd soft, NT, ND Normal external genitalia with patent urethral meatus  Cystoscopy Procedure Note  Patient identification was confirmed, informed consent was obtained, and patient was prepped using Betadine solution.  Lidocaine  jelly was administered per urethral meatus.    Procedure: - Flexible cystoscope introduced, without any difficulty.   - Thorough search of the bladder revealed:    normal urethral meatus    normal urothelium    no stones    no ulcers     no tumors    no urethral polyps    no trabeculation  - Ureteral orifices were normal in position and appearance.  Post-Procedure: - Patient tolerated the procedure well  Assessment/ Plan: No bladder mucosal abnormalities Bilateral, nonobstructing renal calculi Follow-up 3 months with [facility]    Glendia JAYSON Barba, MD

## 2024-04-04 ENCOUNTER — Telehealth: Payer: Self-pay

## 2024-04-04 NOTE — Telephone Encounter (Signed)
 Pt LVM on the triage line stating she was seen a couple weeks ago for blood in urine and is now experiencing back pain,  LVM for pt to return call to triage symptoms.

## 2024-04-05 ENCOUNTER — Emergency Department
Admission: EM | Admit: 2024-04-05 | Discharge: 2024-04-05 | Disposition: A | Discharge status: Home / Self Care | Attending: Emergency Medicine | Admitting: Emergency Medicine

## 2024-04-05 ENCOUNTER — Other Ambulatory Visit: Payer: Self-pay

## 2024-04-05 ENCOUNTER — Emergency Department

## 2024-04-05 DIAGNOSIS — Z21 Asymptomatic human immunodeficiency virus [HIV] infection status: Secondary | ICD-10-CM | POA: Insufficient documentation

## 2024-04-05 DIAGNOSIS — M545 Low back pain, unspecified: Secondary | ICD-10-CM | POA: Diagnosis present

## 2024-04-05 DIAGNOSIS — I509 Heart failure, unspecified: Secondary | ICD-10-CM | POA: Insufficient documentation

## 2024-04-05 DIAGNOSIS — J45909 Unspecified asthma, uncomplicated: Secondary | ICD-10-CM | POA: Diagnosis not present

## 2024-04-05 DIAGNOSIS — N12 Tubulo-interstitial nephritis, not specified as acute or chronic: Secondary | ICD-10-CM | POA: Diagnosis not present

## 2024-04-05 LAB — BASIC METABOLIC PANEL WITH GFR
Anion gap: 10 (ref 5–15)
BUN: 16 mg/dL (ref 6–20)
CO2: 24 mmol/L (ref 22–32)
Calcium: 8.7 mg/dL — ABNORMAL LOW (ref 8.9–10.3)
Chloride: 108 mmol/L (ref 98–111)
Creatinine, Ser: 1.08 mg/dL — ABNORMAL HIGH (ref 0.44–1.00)
GFR, Estimated: 60 mL/min — ABNORMAL LOW (ref >60)
Glucose, Bld: 78 mg/dL (ref 70–99)
Potassium: 4.2 mmol/L (ref 3.5–5.1)
Sodium: 142 mmol/L (ref 135–145)

## 2024-04-05 LAB — URINALYSIS, ROUTINE W REFLEX MICROSCOPIC
Bilirubin Urine: NEGATIVE
Glucose, UA: NEGATIVE mg/dL
Ketones, ur: NEGATIVE mg/dL
Nitrite: NEGATIVE
Protein, ur: 30 mg/dL — AB
RBC / HPF: >50 RBC/hpf (ref 0–5)
Specific Gravity, Urine: 1.009 (ref 1.005–1.030)
pH: 6 (ref 5.0–8.0)

## 2024-04-05 LAB — HEPATIC FUNCTION PANEL
ALT: 8 U/L (ref 0–44)
AST: 17 U/L (ref 15–41)
Albumin: 3.1 g/dL — ABNORMAL LOW (ref 3.5–5.0)
Alkaline Phosphatase: 97 U/L (ref 38–126)
Bilirubin, Direct: <0.1 mg/dL (ref 0.0–0.2)
Total Bilirubin: 0.4 mg/dL (ref 0.0–1.2)
Total Protein: 6.8 g/dL (ref 6.5–8.1)

## 2024-04-05 LAB — CBC
HCT: 45.9 % (ref 36.0–46.0)
Hemoglobin: 14.4 g/dL (ref 12.0–15.0)
MCH: 28.9 pg (ref 26.0–34.0)
MCHC: 31.4 g/dL (ref 30.0–36.0)
MCV: 92.2 fL (ref 80.0–100.0)
Platelets: 181 K/uL (ref 150–400)
RBC: 4.98 MIL/uL (ref 3.87–5.11)
RDW: 13.4 % (ref 11.5–15.5)
WBC: 7.1 K/uL (ref 4.0–10.5)
nRBC: 0 % (ref 0.0–0.2)

## 2024-04-05 LAB — LIPASE, BLOOD: Lipase: 29 U/L (ref 11–51)

## 2024-04-05 MED ORDER — IOHEXOL 300 MG/ML  SOLN
75.0000 mL | Freq: Once | INTRAMUSCULAR | Status: AC | PRN
  Administered 2024-04-05: 75 mL via INTRAVENOUS

## 2024-04-05 MED ORDER — LIDOCAINE 5 % EX PTCH: 1.0000 | MEDICATED_PATCH | CUTANEOUS | 0 refills | Status: AC

## 2024-04-05 MED ORDER — ONDANSETRON HCL 4 MG/2ML IJ SOLN
4.0000 mg | Freq: Once | INTRAMUSCULAR | Status: AC
  Administered 2024-04-05: 4 mg via INTRAVENOUS
  Filled 2024-04-05: qty 2

## 2024-04-05 MED ORDER — HYDROMORPHONE HCL 1 MG/ML IJ SOLN
0.5000 mg | Freq: Once | INTRAMUSCULAR | Status: AC
  Administered 2024-04-05: 0.5 mg via INTRAVENOUS
  Filled 2024-04-05: qty 0.5

## 2024-04-05 MED ORDER — ONDANSETRON 4 MG PO TBDP: 4.0000 mg | ORAL_TABLET | Freq: Three times a day (TID) | ORAL | 0 refills | Status: AC | PRN

## 2024-04-05 MED ORDER — CEFDINIR 300 MG PO CAPS: 300.0000 mg | ORAL_CAPSULE | Freq: Two times a day (BID) | ORAL | 0 refills | Status: AC

## 2024-04-05 MED ORDER — LIDOCAINE 5 % EX PTCH
1.0000 | MEDICATED_PATCH | CUTANEOUS | Status: DC
  Administered 2024-04-05: 1 via TRANSDERMAL
  Filled 2024-04-05: qty 1

## 2024-04-05 MED ORDER — OXYCODONE HCL 5 MG PO TABS: 5.0000 mg | ORAL_TABLET | Freq: Four times a day (QID) | ORAL | 0 refills | Status: AC | PRN

## 2024-04-05 NOTE — Discharge Instructions (Addendum)
 We are covering you for possible pyelonephritis.  We have started you on some antibiotics to cover a UTI that could have spread into your kidneys.  You should return to the ER if you develop fevers, worsening symptoms or any other concerns.  For your back pain the safest thing to take is Tylenol  1 g every 8 hours with ibuprofen 600 every 8 hours with food for 1 week.  You can also use lidocaine  patches.  You can use the oxycodone  for breakthrough pain but the effects of this can be stronger related to your medications that you are on so try to take the least amount but is still helpful.  Please call your primary care doctor to make a follow-up appointment in 2 days to be reevaluated return to the ER for fevers, leg weakness, numbness or any other concerns  IMPRESSION: 1. No acute findings or clear explanation for the patient's symptoms. 2. Nonobstructing bilateral renal calculi. No evidence of ureteral calculus or hydronephrosis. 3. Septated, low-density left retroperitoneal mass just inferior to the left renal vessels, grossly unchanged from the previous CT of approximately 5 weeks ago but not well seen on previous older noncontrast studies. This is nonspecific, but could reflect a lymphangioma or other benign lesion. Malignancy considered unlikely. Consider further evaluation with nonemergent abdominal MRI with and without contrast. 4.  Aortic Atherosclerosis (ICD10-I70.0).  Take oxycodone  as prescribed. Do not drink alcohol, drive or participate in any other potentially dangerous activities while taking this medication as it may make you sleepy. Do not take this medication with any other sedating medications, either prescription or over-the-counter. If you were prescribed Percocet or Vicodin, do not take these with acetaminophen  (Tylenol ) as it is already contained within these medications.  This medication is an opiate (or narcotic) pain medication and can be habit forming. Use it as little  as possible to achieve adequate pain control. Do not use or use it with extreme caution if you have a history of opiate abuse or dependence. If you are on a pain contract with your primary care doctor or a pain specialist, be sure to let them know you were prescribed this medication today from the Emergency Department. This medication is intended for your use only - do not give any to anyone else and keep it in a secure place where nobody else, especially children, have access to it.

## 2024-04-05 NOTE — ED Notes (Signed)
Pt taken to ct via w/c with ct tech

## 2024-04-05 NOTE — ED Provider Notes (Signed)
 [facility] Provider Note    Event Date/Time   First MD Initiated Contact with Patient 04/05/24 1106     (approximate)   History   Flank Pain   HPI  Claire Day is a 58 y.o. female with history of asthma, kidney stones, HIV, CHF with AICD who comes in with concerns for flank pain.  Patient had a CT scan on 02/29/2024 that showed nonobstructing bilateral kidney stones 8 mm on the right and 6 mm on the left.  Patient reports pain on her whole lower back.  She denies any pain in her upper back.  Denies any chest pain, shortness of breath.  She reports that the pain is there no matter what even if she is moving.  She denies it radiating into her abdomen.  She reports being compliant with her medications although she has not taken her blood pressure medication yet today.  When I discussed with her that upon last discharge summary they had recommended losartan  hydralazine  she reports having symptoms from taking this and so she has only been taking her amlodipine but she did not take it this morning.   Physical Exam   Triage Vital Signs: ED Triage Vitals  Encounter Vitals Group     BP 04/05/24 1000 (!) 180/110     Girls Systolic BP Percentile --      Girls Diastolic BP Percentile --      Boys Systolic BP Percentile --      Boys Diastolic BP Percentile --      Pulse Rate 04/05/24 1000 (!) 107     Resp 04/05/24 1000 19     Temp 04/05/24 1000 98.3 F (36.8 C)     Temp src --      SpO2 04/05/24 1000 100 %     Weight 04/05/24 1002 100 lb (45.4 kg)     Height 04/05/24 1002 5' 7 (1.702 m)     Head Circumference --      Peak Flow --      Pain Score 04/05/24 1002 8     Pain Loc --      Pain Education --      Exclude from Growth Chart --     Most recent vital signs: Vitals:   04/05/24 1000  BP: (!) 180/110  Pulse: (!) 107  Resp: 19  Temp: 98.3 F (36.8 C)  SpO2: 100%     General: Awake, no distress.  CV:  Good peripheral perfusion.   Resp:  Normal effort.  Abd:  No distention.  Soft and nontender.  She reports some low back pain but no rash noted Other:  Equal strength in both legs.  Sensation intact.   ED Results / Procedures / Treatments   Labs (all labs ordered are listed, but only abnormal results are displayed) Labs Reviewed  BASIC METABOLIC PANEL WITH GFR - Abnormal; Notable for the following components:      Result Value   Creatinine, Ser 1.08 (*)    Calcium 8.7 (*)    GFR, Estimated 60 (*)    All other components within normal limits  CBC  URINALYSIS, ROUTINE W REFLEX MICROSCOPIC      RADIOLOGY I have reviewed the ct personally and interpreted no obstructing kidney stone   PROCEDURES:  Critical Care performed: No  Procedures   MEDICATIONS ORDERED IN ED: Medications  lidocaine  (LIDODERM ) 5 % 1 patch (1 patch Transdermal Patch Applied 04/05/24 1205)  HYDROmorphone  (DILAUDID ) injection 0.5 mg (0.5  mg Intravenous Given 04/05/24 1221)  ondansetron  (ZOFRAN ) injection 4 mg (4 mg Intravenous Given 04/05/24 1222)  iohexol  (OMNIPAQUE ) 300 MG/ML solution 75 mL (75 mLs Intravenous Contrast Given 04/05/24 1250)     IMPRESSION / MDM / ASSESSMENT AND PLAN / ED COURSE  I reviewed the triage vital signs and the nursing notes.   Patient's presentation is most consistent with acute presentation with potential threat to life or bodily function.   Differential includes kidney stone, aortic aneurysm, obstruction.  Will proceed with CT imaging to further evaluate.  Patient also noted to have some elevated blood pressure but she did not take her blood pressure medications today and she is in pain suspect that this is contributing.  She is adamant that she has got no chest pain no shortness of breath and the pain is all in her lower back.  Is not epigastric in nature.  Do not feel like this represents ACS no indication for EKG, troponins.  She has no symptoms suggest cord compression.    CBC is reassuring.  BMP  shows improving kidney function. Urine looks concerning for UTI will send for culture and given she does report ports some back pain with increased urine frequency.  Will cover for possible pyelonephritis with 10-day course of cefdinir .  Her BMP is reassuring her CBC does not show any elevated white count and hepatic function normal   IMPRESSION: 1. No acute findings or clear explanation for the patient's symptoms. 2. Nonobstructing bilateral renal calculi. No evidence of ureteral calculus or hydronephrosis. 3. Septated, low-density left retroperitoneal mass just inferior to the left renal vessels, grossly unchanged from the previous CT of approximately 5 weeks ago but not well seen on previous older noncontrast studies. This is nonspecific, but could reflect a lymphangioma or other benign lesion. Malignancy considered unlikely. Consider further evaluation with nonemergent abdominal MRI with and without contrast. 4.  Aortic Atherosclerosis (ICD10-I70.0).  Discussed with patient the mass noted and need to follow-up with her primary care doctor.  She does report having a primary care doctor and understands the need for outpatient MRI to rule out cancer  Discussed with patient treatment with Tylenol , ibuprofen but she reports taking this at home.  Will give her a short course of oxycodone  to help with symptom treatment.  We understands not to drive or work while on this and she understands the dangers of oxycodone  including constipation.  Upon discharge patient heart rate was still little bit elevated so I did get a EKG with my interpretation that shows atrially sensed ventricular paced rhythm with a rate of 101 without any significant ST elevation or T wave inversions.  Patient continues to not have any chest pain or shortness of breath I do not really want to give her fluids as she has a low EF and I do not want to risk fluid overload and she is asymptomatic other than the low back pain so I do  not think it is related.  Patient feels comfortable trying to increase oral hydration at home and will return to the ER if she develops any chest pain or shortness of breath but she continues deny any B symptoms. I have recheck of her saturation was between 95 and 96% and she is adamant that she has no shortness of breath and feels comfortable with discharge home prefers discharge home she stated that she return if she developed any chest pain or shortness of breath.   FINAL CLINICAL IMPRESSION(S) / ED DIAGNOSES  Final diagnoses:  Low back pain, unspecified back pain laterality, unspecified chronicity, unspecified whether sciatica present  Pyelonephritis     Rx / DC Orders   ED Discharge Orders          Ordered    cefdinir  (OMNICEF ) 300 MG capsule  2 times daily        04/05/24 1340    ondansetron  (ZOFRAN -ODT) 4 MG disintegrating tablet  Every 8 hours PRN        04/05/24 1340    oxyCODONE  (ROXICODONE ) 5 MG immediate release tablet  Every 6 hours PRN        04/05/24 1340    lidocaine  (LIDODERM ) 5 %  Every 24 hours        04/05/24 1340             Note:  This document was prepared using Dragon voice recognition software and may include unintentional dictation errors.   Ernest Ronal BRAVO, MD 04/05/24 1355

## 2024-04-05 NOTE — ED Triage Notes (Signed)
 Pt comes in via pov with complaints of flank pain and frequent urination that started 2 days ago. Pt states that she also notices blood in her urine. Pt complains of pain 8/10 at this time. Pt hypertensive in triage, and states that she is in bp medications but hasn't taken them the past couple of days.

## 2024-04-05 NOTE — ED Notes (Signed)
 Pt reports that she has been having lower back pain for the past 2 days, hx of kidney stones requiring stents in her ureter. Pt states that she saw dr kennith two weeks ago and her urine was good but that he told her she had additional stones in her kidneys, states that the pain is causing her to go to the bathroom more frequently

## 2024-04-07 LAB — URINE CULTURE: Culture: >=100000 — AB

## 2024-05-10 ENCOUNTER — Other Ambulatory Visit: Payer: Self-pay

## 2024-05-10 ENCOUNTER — Emergency Department

## 2024-05-10 ENCOUNTER — Encounter: Payer: Self-pay | Admitting: Emergency Medicine

## 2024-05-10 ENCOUNTER — Emergency Department
Admission: EM | Admit: 2024-05-10 | Discharge: 2024-05-10 | Disposition: A | Discharge status: Home / Self Care | Attending: Emergency Medicine | Admitting: Emergency Medicine

## 2024-05-10 DIAGNOSIS — I130 Hypertensive heart and chronic kidney disease with heart failure and stage 1 through stage 4 chronic kidney disease, or unspecified chronic kidney disease: Secondary | ICD-10-CM | POA: Diagnosis not present

## 2024-05-10 DIAGNOSIS — Z8541 Personal history of malignant neoplasm of cervix uteri: Secondary | ICD-10-CM | POA: Diagnosis not present

## 2024-05-10 DIAGNOSIS — J441 Chronic obstructive pulmonary disease with (acute) exacerbation: Secondary | ICD-10-CM | POA: Insufficient documentation

## 2024-05-10 DIAGNOSIS — I5022 Chronic systolic (congestive) heart failure: Secondary | ICD-10-CM | POA: Insufficient documentation

## 2024-05-10 DIAGNOSIS — Z95810 Presence of automatic (implantable) cardiac defibrillator: Secondary | ICD-10-CM | POA: Diagnosis not present

## 2024-05-10 DIAGNOSIS — Z21 Asymptomatic human immunodeficiency virus [HIV] infection status: Secondary | ICD-10-CM | POA: Diagnosis not present

## 2024-05-10 DIAGNOSIS — R911 Solitary pulmonary nodule: Secondary | ICD-10-CM | POA: Insufficient documentation

## 2024-05-10 DIAGNOSIS — R0602 Shortness of breath: Secondary | ICD-10-CM | POA: Diagnosis present

## 2024-05-10 DIAGNOSIS — F17200 Nicotine dependence, unspecified, uncomplicated: Secondary | ICD-10-CM | POA: Insufficient documentation

## 2024-05-10 DIAGNOSIS — N183 Chronic kidney disease, stage 3 unspecified: Secondary | ICD-10-CM | POA: Diagnosis not present

## 2024-05-10 LAB — CBC
HCT: 46.8 % — ABNORMAL HIGH (ref 36.0–46.0)
Hemoglobin: 15.3 g/dL — ABNORMAL HIGH (ref 12.0–15.0)
MCH: 28.8 pg (ref 26.0–34.0)
MCHC: 32.7 g/dL (ref 30.0–36.0)
MCV: 88.1 fL (ref 80.0–100.0)
Platelets: 145 K/uL — ABNORMAL LOW (ref 150–400)
RBC: 5.31 MIL/uL — ABNORMAL HIGH (ref 3.87–5.11)
RDW: 15.3 % (ref 11.5–15.5)
WBC: 5.7 K/uL (ref 4.0–10.5)
nRBC: 0 % (ref 0.0–0.2)

## 2024-05-10 LAB — BASIC METABOLIC PANEL WITH GFR
Anion gap: 12 (ref 5–15)
BUN: 16 mg/dL (ref 6–20)
CO2: 24 mmol/L (ref 22–32)
Calcium: 9.1 mg/dL (ref 8.9–10.3)
Chloride: 102 mmol/L (ref 98–111)
Creatinine, Ser: 1.31 mg/dL — ABNORMAL HIGH (ref 0.44–1.00)
GFR, Estimated: 47 mL/min — ABNORMAL LOW (ref >60)
Glucose, Bld: 103 mg/dL — ABNORMAL HIGH (ref 70–99)
Potassium: 4.1 mmol/L (ref 3.5–5.1)
Sodium: 138 mmol/L (ref 135–145)

## 2024-05-10 LAB — D-DIMER, QUANTITATIVE: D-Dimer, Quant: 1.3 ug{FEU}/mL — ABNORMAL HIGH (ref 0.00–0.50)

## 2024-05-10 MED ORDER — DOXYCYCLINE HYCLATE 100 MG PO CAPS: 100.0000 mg | ORAL_CAPSULE | Freq: Two times a day (BID) | ORAL | 0 refills | Status: AC

## 2024-05-10 MED ORDER — PREDNISONE 20 MG PO TABS: 40.0000 mg | ORAL_TABLET | Freq: Every day | ORAL | 0 refills | Status: AC

## 2024-05-10 MED ORDER — IOHEXOL 350 MG/ML SOLN
75.0000 mL | Freq: Once | INTRAVENOUS | Status: AC | PRN
  Administered 2024-05-10: 75 mL via INTRAVENOUS

## 2024-05-10 MED ORDER — SODIUM CHLORIDE 0.9 % IV BOLUS
1000.0000 mL | Freq: Once | INTRAVENOUS | Status: AC
  Administered 2024-05-10: 1000 mL via INTRAVENOUS

## 2024-05-10 MED ORDER — METHYLPREDNISOLONE SODIUM SUCC 125 MG IJ SOLR
80.0000 mg | INTRAMUSCULAR | Status: AC
  Administered 2024-05-10: 80 mg via INTRAVENOUS
  Filled 2024-05-10: qty 2

## 2024-05-10 MED ORDER — DOXYCYCLINE HYCLATE 100 MG PO TABS
100.0000 mg | ORAL_TABLET | Freq: Once | ORAL | Status: AC
  Administered 2024-05-10: 100 mg via ORAL
  Filled 2024-05-10: qty 1

## 2024-05-10 NOTE — ED Notes (Signed)
 Patient in Xray

## 2024-05-10 NOTE — ED Triage Notes (Signed)
 Arrived by [facility] from home for SOB. Tripoding in floor when EMS arrived. Has been using breathing trx at home with no relief. Denies pain   Has pacemaker   EMS vitals: 165/103 b/p 135HR 95% RA

## 2024-05-10 NOTE — ED Triage Notes (Signed)
 Pt via [facility] from home. Pt c/o SOB since 3:00pm today, reports she has a hx of COPD, and smokes daily. Denies any fevers or sick contacts. Pt 2L [place] which is not normal for her EMS placed on 2L. States she has a cough, non-productive. Pt is A&Ox4, tachypneic on arrival.

## 2024-05-10 NOTE — ED Provider Notes (Signed)
 [facility] Provider Note    Event Date/Time   First MD Initiated Contact with Patient 05/10/24 1720     (approximate)   History   Chief Complaint: Shortness of Breath   HPI  Claire Day is a 58 y.o. female with a history of CKD, COPD, hypertension who comes ED complaining of shortness of breath that started at 3:00 PM today.  No cough.  No chest pain.  No pleuritic symptoms.  No fever or chills, no vomiting or diarrhea.  Feels better after receiving nebulizer treatment by EMS.  Request something to eat        Past Medical History:  Diagnosis Date   Allergic rhinitis    Anemia    Aortic insufficiency    Cerebral aneurysm 02/12/2023   a.) CT head 02/12/2023:  3 x 3 mm distal LEFT M1 MCA just proximal to the bifurcation   Chronic systolic heart failure (HCC)    CKD (chronic kidney disease), stage III (HCC)    COPD (chronic obstructive pulmonary disease) (HCC)    History of kidney stones    HIV (human immunodeficiency virus infection) (HCC) 2003   a.) Dx'd in 2003; b.) currently Tx'd with darunavir -cobicistat  + rilpivirine    HLD (hyperlipidemia)    Hydronephrosis    Hypertension    LBBB (left bundle branch block)    MI (myocardial infarction) (HCC)    Moderate mitral regurgitation    Nephrolithiasis    NICM (nonischemic cardiomyopathy) (HCC)    Pneumonia due to Streptococcus    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   a.) Medtronic device placed 09/20/2017 --> CLARIA MRI CRT-D QUAD DF-4 (SN: [id number])   Right upper lobe pulmonary nodule 02/12/2023   a.) CT imaging 02/12/2023:  9 x 7 mm   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    Thrombocytopenia    TIA (transient ischemic attack) 02/12/2023   Tobacco abuse     Current Outpatient Rx   Order #: [id number] Class: Normal   Order #: [id number] Class: Normal   Order #: [id number] Class: Normal   Order #: [id number] Class: Historical Med    Order #: [id number] Class: Historical Med   Order #: [id number] Class: Normal   Order #: [id number] Class: Normal   Order #: [id number] Class: Historical Med   Order #: [id number] Class: Print   Order #: [id number] Class: OTC   Order #: [id number] Class: Historical Med   Order #: [id number] Class: Historical Med    Past Surgical History:  Procedure Laterality Date   BI-VENTRICULAR IMPLANTABLE CARDIOVERTER DEFIBRILLATOR  (CRT-D) Left 09/20/2017   Procedure: BI-VENTRICULAR IMPLANTABLE CARDIOVERTER DEFIBRILLATOR (CRT-D); Location: [facility]; Surgeon: Nancyann Ceo, MD   CHOLECYSTECTOMY     CYSTOSCOPY W/ URETERAL STENT PLACEMENT Left 06/10/2023   Procedure: CYSTOSCOPY WITH RETROGRADE PYELOGRAM/URETERAL STENT PLACEMENT;  Surgeon: Francisca Redell BROCKS, MD;  Location: [facility];  Service: Urology;  Laterality: Left;   CYSTOSCOPY/URETEROSCOPY/HOLMIUM LASER/STENT PLACEMENT Left 07/05/2023   Procedure: CYSTOSCOPY/URETEROSCOPY/HOLMIUM LASER/STENT EXCHANGE;  Surgeon: Twylla Glendia BROCKS, MD;  Location: [facility];  Service: Urology;  Laterality: Left;    Physical Exam   Triage Vital Signs: ED Triage Vitals  Encounter Vitals Group     BP 05/10/24 1623 (!) 152/102     Girls Systolic BP Percentile --      Girls Diastolic BP Percentile --      Boys Systolic BP Percentile --      Boys Diastolic BP Percentile --      Pulse Rate 05/10/24 1623 (!) 135  Resp 05/10/24 1623 (!) 26     Temp --      Temp src --      SpO2 05/10/24 1623 93 %     Weight 05/10/24 1612 101 lb (45.8 kg)     Height 05/10/24 1612 5' 7.5 (1.715 m)     Head Circumference --      Peak Flow --      Pain Score 05/10/24 1612 5     Pain Loc --      Pain Education --      Exclude from Growth Chart --     Most recent vital signs: Vitals:   05/10/24 1730 05/10/24 1800  BP: (!) 156/110 (!) 154/100  Pulse: (!) 124 (!) 121  Resp: (!) 27 (!) 26  SpO2: 92% 93%    General: Awake, no distress.  CV:  Good peripheral perfusion.  Tachycardia heart rate  130.  Symmetric distal pulses Resp:  Normal effort.  Good air entry bilaterally without wheezing or prolonged expiratory phase.  No inducible cough or wheeze with FEV1 maneuver Abd:  No distention.  Soft nontender Other:  No lower extremity edema, symmetric calf circumference.   ED Results / Procedures / Treatments   Labs (all labs ordered are listed, but only abnormal results are displayed) Labs Reviewed  BASIC METABOLIC PANEL WITH GFR - Abnormal; Notable for the following components:      Result Value   Glucose, Bld 103 (*)    Creatinine, Ser 1.31 (*)    GFR, Estimated 47 (*)    All other components within normal limits  CBC - Abnormal; Notable for the following components:   RBC 5.31 (*)    Hemoglobin 15.3 (*)    HCT 46.8 (*)    Platelets 145 (*)    All other components within normal limits  D-DIMER, QUANTITATIVE - Abnormal; Notable for the following components:   D-Dimer, Quant 1.30 (*)    All other components within normal limits     EKG Interpreted by me Sinus tach cardia rate 134.  Normal axis, prolonged QTc of 525 ms.  Left bundle branch block.  LVH.   RADIOLOGY Chest x-ray interpreted by me, unremarkable.  Consistent with COPD/emphysema.  Radiology report reviewed   PROCEDURES:  Procedures   MEDICATIONS ORDERED IN ED: Medications  doxycycline (VIBRA-TABS) tablet 100 mg (has no administration in time range)  sodium chloride  0.9 % bolus 1,000 mL (0 mLs Intravenous Stopped 05/10/24 1902)  methylPREDNISolone  sodium succinate (SOLU-MEDROL ) 125 mg/2 mL injection 80 mg (80 mg Intravenous Given 05/10/24 1817)  iohexol  (OMNIPAQUE ) 350 MG/ML injection 75 mL (75 mLs Intravenous Contrast Given 05/10/24 1915)     IMPRESSION / MDM / ASSESSMENT AND PLAN / ED COURSE  I reviewed the triage vital signs and the nursing notes.  DDx: Pneumonia, pneumothorax, COPD exacerbation, electrolyte derangement, anemia, AKI, pulmonary embolism  Patient's presentation is most  consistent with acute presentation with potential threat to life or bodily function.  Patient presents with shortness of breath most likely due to COPD exacerbation, now symptomatically feeling improved.  However vital signs are abnormal with tachycardia, borderline hypoxia with oxygenation of 90% on room air, tachypnea.  She is nontoxic, well-appearing.  Will check D-dimer while giving fluids and Solu-Medrol .   ----------------------------------------- 8:08 PM on 05/10/2024 ----------------------------------------- Patient continues to feel well, lungs are clear to auscultation.  Oxygen saturation stable at 91% on room air.  Tachycardia improved, heart rate 110.  CT angio of the chest is negative.  Suspect a degree of beta agonist effect.  She is tolerating oral intake and comfortable and stable for discharge.  Counseled her on the pulmonary nodule finding again, she is interested in following up with pulmonary nodule clinic in [place].      FINAL CLINICAL IMPRESSION(S) / ED DIAGNOSES   Final diagnoses:  COPD exacerbation (HCC)  Pulmonary nodule     Rx / DC Orders   ED Discharge Orders          Ordered    AMB  Referral to Pulmonary Nodule Clinic        05/10/24 2003    doxycycline (VIBRAMYCIN) 100 MG capsule  2 times daily        05/10/24 2008    predniSONE  (DELTASONE ) 20 MG tablet  Daily with breakfast        05/10/24 2008             Note:  This document was prepared using Dragon voice recognition software and may include unintentional dictation errors.   Viviann Pastor, MD 05/10/24 2009

## 2024-06-26 ENCOUNTER — Ambulatory Visit
Admission: RE | Admit: 2024-06-26 | Discharge: 2024-06-26 | Disposition: A | Source: Ambulatory Visit | Attending: Urology | Admitting: Urology

## 2024-06-26 ENCOUNTER — Ambulatory Visit: Admitting: Urology

## 2024-06-26 ENCOUNTER — Encounter: Payer: Self-pay | Admitting: Urology

## 2024-06-26 ENCOUNTER — Ambulatory Visit
Admission: RE | Admit: 2024-06-26 | Discharge: 2024-06-26 | Disposition: A | Discharge status: Home / Self Care | Attending: Urology | Admitting: Urology

## 2024-06-26 VITALS — BP 140/94 | HR 99 | Ht 61.0 in | Wt 101.0 lb

## 2024-06-26 DIAGNOSIS — R1900 Intra-abdominal and pelvic swelling, mass and lump, unspecified site: Secondary | ICD-10-CM | POA: Diagnosis not present

## 2024-06-26 DIAGNOSIS — N200 Calculus of kidney: Secondary | ICD-10-CM

## 2024-06-26 NOTE — Progress Notes (Unsigned)
 06/26/2024 9:00 AM   [name] 1966/02/10 [id number]  Referring provider: Meade Bigness, MD [facility] [address] [place],  [place] [address]  Chief Complaint  Patient presents with   Nephrolithiasis   Urologic history: 1.  Recurrent nephrolithiasis Long history recurrent stone disease Left ureteral stent placement 06/10/2023 with UTI and a left proximal ureteral calculus; follow-up left ureteroscopy/laser lithotripsy 07/05/2023 CT 04/05/2024: Bilateral nonobstructing renal calculi  HPI: Claire Day is a 58 y.o. female who presents for a follow-up visit.  No complaints since last visit Denies flank, abdominal or pelvic pain No dysuria or gross hematuria CT performed in the ED 04/05/2024 showed bilateral, nonobstructing renal calculi. She does have a 10 mm nonobstructing right renal pelvic calculus Also noted to have a low-density left retroperitoneal mass which has been seen on prior scans which is stable and felt to be benign   PMH: Past Medical History:  Diagnosis Date   Allergic rhinitis    Anemia    Aortic insufficiency    Cerebral aneurysm 02/12/2023   a.) CT head 02/12/2023:  3 x 3 mm distal LEFT M1 MCA just proximal to the bifurcation   Chronic systolic heart failure (HCC)    CKD (chronic kidney disease), stage III (HCC)    COPD (chronic obstructive pulmonary disease) (HCC)    History of kidney stones    HIV (human immunodeficiency virus infection) (HCC) 2003   a.) Dx'd in 2003; b.) currently Tx'd with darunavir -cobicistat  + rilpivirine    HLD (hyperlipidemia)    Hydronephrosis    Hypertension    LBBB (left bundle branch block)    MI (myocardial infarction) (HCC)    Moderate mitral regurgitation    Nephrolithiasis    NICM (nonischemic cardiomyopathy) (HCC)    Pneumonia due to Streptococcus    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   a.) Medtronic device placed 09/20/2017 --> CLARIA MRI  CRT-D QUAD DF-4 (SN: [id number])   Right upper lobe pulmonary nodule 02/12/2023   a.) CT imaging 02/12/2023:  9 x 7 mm   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    Thrombocytopenia    TIA (transient ischemic attack) 02/12/2023   Tobacco abuse     Surgical History: Past Surgical History:  Procedure Laterality Date   BI-VENTRICULAR IMPLANTABLE CARDIOVERTER DEFIBRILLATOR  (CRT-D) Left 09/20/2017   Procedure: BI-VENTRICULAR IMPLANTABLE CARDIOVERTER DEFIBRILLATOR (CRT-D); Location: [facility]; Surgeon: Nancyann Ceo, MD   CHOLECYSTECTOMY     CYSTOSCOPY W/ URETERAL STENT PLACEMENT Left 06/10/2023   Procedure: CYSTOSCOPY WITH RETROGRADE PYELOGRAM/URETERAL STENT PLACEMENT;  Surgeon: Francisca Redell BROCKS, MD;  Location: [facility];  Service: Urology;  Laterality: Left;   CYSTOSCOPY/URETEROSCOPY/HOLMIUM LASER/STENT PLACEMENT Left 07/05/2023   Procedure: CYSTOSCOPY/URETEROSCOPY/HOLMIUM LASER/STENT EXCHANGE;  Surgeon: Twylla Glendia BROCKS, MD;  Location: [facility];  Service: Urology;  Laterality: Left;    Home Medications:  Allergies as of 06/26/2024       Reactions   Sulfonamide Derivatives Hives, Dermatitis   Tramadol Other (See Comments)   Upset stomach and it gave her the shakes         Medication List        Accurate as of June 26, 2024  9:00 AM. If you have any questions, ask your nurse or doctor.          STOP taking these medications    isosorbide  mononitrate 30 MG 24 hr tablet Commonly known as: IMDUR  Stopped by: Aubreyanna Dorrough C Michael Walrath   multivitamin with minerals Tabs tablet Stopped  by: Glendia JAYSON Barba   nicotine  14 mg/24hr patch Commonly known as: NICODERM CQ  - dosed in mg/24 hours Stopped by: Glendia JAYSON Barba       TAKE these medications    albuterol  108 (90 Base) MCG/ACT inhaler Commonly known as: VENTOLIN  HFA Inhale 2 puffs into the lungs every 4 (four) hours as needed for wheezing or shortness of breath.   dapsone  100 MG tablet Take 100 mg by mouth at  bedtime.   darunavir -cobicistat  800-150 MG tablet Commonly known as: PREZCOBIX  Take 1 tablet by mouth at bedtime. Swallow whole. Do NOT crush, break or chew tablets. Take with food.   ipratropium-albuterol  0.5-2.5 (3) MG/3ML Soln Commonly known as: DUONEB Take 3 mLs by nebulization every 6 (six) hours as needed (Shortness of breath and wheezing).   mirtazapine 15 MG tablet Commonly known as: REMERON Take 15 mg by mouth at bedtime.   rilpivirine  25 MG Tabs tablet Commonly known as: EDURANT  Take 25 mg by mouth at bedtime.   Stiolto Respimat 2.5-2.5 MCG/ACT Aers Generic drug: Tiotropium Bromide-Olodaterol Inhale 2 puffs into the lungs daily as needed (wheezing, shortness of breath).        Allergies:  Allergies  Allergen Reactions   Sulfonamide Derivatives Hives and Dermatitis   Tramadol Other (See Comments)    Upset stomach and it gave her the shakes     Family History: No family history on file.  Social History:  reports that she has been smoking cigarettes. She does not have any smokeless tobacco history on file. She reports that she does not drink alcohol and does not use drugs.   Physical Exam: BP (!) 140/94   Pulse 99   Ht 5' 1 (1.549 m)   Wt 101 lb (45.8 kg)   BMI 19.08 kg/m   Constitutional:  Alert, No acute distress. HEENT: [place] AT Respiratory: Normal respiratory effort, no increased work of breathing. Psychiatric: Normal mood and affect.   Pertinent Imaging: CT 04/01/2024 was personally reviewed and interpreted.  A KUB performed prior to today's visit was also personally reviewed and interpreted.  Right renal pelvic calculus easily visualized.   Assessment & Plan:    1. Bilateral nephrolithiasis (Primary) We discussed treatment of her right renal pelvic calculus including ureteroscopy/laser lithotripsy and SWL She declines treatment at this time and is agreeable to periodic monitoring Follow-up KUB 6 months and instructed call earlier for  development of right flank pain She is interested in pursuing a metabolic evaluation and order placed through [facility]  2.  Left retroperitoneal mass Cystic retroperitoneal mass which is stable   Glendia JAYSON Barba, MD  [facility] [address], [address] [place], [place] [address] [phone]

## 2024-06-26 NOTE — Patient Instructions (Signed)

## 2024-06-28 ENCOUNTER — Encounter: Payer: Self-pay | Admitting: Urology

## 2024-07-25 ENCOUNTER — Inpatient Hospital Stay
Admission: EM | Admit: 2024-07-25 | Discharge: 2024-07-27 | DRG: 974 | Disposition: A | Discharge status: Home / Self Care | Attending: Student in an Organized Health Care Education/Training Program | Admitting: Student in an Organized Health Care Education/Training Program

## 2024-07-25 ENCOUNTER — Emergency Department

## 2024-07-25 DIAGNOSIS — I252 Old myocardial infarction: Secondary | ICD-10-CM | POA: Diagnosis not present

## 2024-07-25 DIAGNOSIS — E785 Hyperlipidemia, unspecified: Secondary | ICD-10-CM | POA: Diagnosis present

## 2024-07-25 DIAGNOSIS — D696 Thrombocytopenia, unspecified: Secondary | ICD-10-CM | POA: Diagnosis present

## 2024-07-25 DIAGNOSIS — J1282 Pneumonia due to coronavirus disease 2019: Secondary | ICD-10-CM | POA: Diagnosis not present

## 2024-07-25 DIAGNOSIS — I671 Cerebral aneurysm, nonruptured: Secondary | ICD-10-CM | POA: Diagnosis present

## 2024-07-25 DIAGNOSIS — Z882 Allergy status to sulfonamides status: Secondary | ICD-10-CM | POA: Diagnosis not present

## 2024-07-25 DIAGNOSIS — Z21 Asymptomatic human immunodeficiency virus [HIV] infection status: Secondary | ICD-10-CM | POA: Diagnosis present

## 2024-07-25 DIAGNOSIS — N182 Chronic kidney disease, stage 2 (mild): Secondary | ICD-10-CM | POA: Diagnosis present

## 2024-07-25 DIAGNOSIS — I10 Essential (primary) hypertension: Secondary | ICD-10-CM | POA: Diagnosis present

## 2024-07-25 DIAGNOSIS — I130 Hypertensive heart and chronic kidney disease with heart failure and stage 1 through stage 4 chronic kidney disease, or unspecified chronic kidney disease: Secondary | ICD-10-CM | POA: Diagnosis present

## 2024-07-25 DIAGNOSIS — Z681 Body mass index (BMI) 19 or less, adult: Secondary | ICD-10-CM | POA: Diagnosis not present

## 2024-07-25 DIAGNOSIS — R000 Tachycardia, unspecified: Secondary | ICD-10-CM

## 2024-07-25 DIAGNOSIS — Z79899 Other long term (current) drug therapy: Secondary | ICD-10-CM | POA: Diagnosis not present

## 2024-07-25 DIAGNOSIS — J441 Chronic obstructive pulmonary disease with (acute) exacerbation: Principal | ICD-10-CM | POA: Diagnosis present

## 2024-07-25 DIAGNOSIS — J09X2 Influenza due to identified novel influenza A virus with other respiratory manifestations: Secondary | ICD-10-CM | POA: Diagnosis not present

## 2024-07-25 DIAGNOSIS — Z885 Allergy status to narcotic agent status: Secondary | ICD-10-CM

## 2024-07-25 DIAGNOSIS — I5022 Chronic systolic (congestive) heart failure: Secondary | ICD-10-CM | POA: Diagnosis present

## 2024-07-25 DIAGNOSIS — Z8673 Personal history of transient ischemic attack (TIA), and cerebral infarction without residual deficits: Secondary | ICD-10-CM

## 2024-07-25 DIAGNOSIS — J984 Other disorders of lung: Secondary | ICD-10-CM | POA: Diagnosis present

## 2024-07-25 DIAGNOSIS — F17210 Nicotine dependence, cigarettes, uncomplicated: Secondary | ICD-10-CM | POA: Diagnosis present

## 2024-07-25 DIAGNOSIS — J101 Influenza due to other identified influenza virus with other respiratory manifestations: Secondary | ICD-10-CM | POA: Diagnosis present

## 2024-07-25 DIAGNOSIS — B20 Human immunodeficiency virus [HIV] disease: Secondary | ICD-10-CM | POA: Diagnosis present

## 2024-07-25 DIAGNOSIS — J9601 Acute respiratory failure with hypoxia: Secondary | ICD-10-CM | POA: Diagnosis present

## 2024-07-25 DIAGNOSIS — U071 COVID-19: Secondary | ICD-10-CM | POA: Diagnosis present

## 2024-07-25 DIAGNOSIS — I428 Other cardiomyopathies: Secondary | ICD-10-CM | POA: Diagnosis present

## 2024-07-25 DIAGNOSIS — Z95810 Presence of automatic (implantable) cardiac defibrillator: Secondary | ICD-10-CM

## 2024-07-25 DIAGNOSIS — J09X1 Influenza due to identified novel influenza A virus with pneumonia: Secondary | ICD-10-CM | POA: Diagnosis not present

## 2024-07-25 DIAGNOSIS — F17200 Nicotine dependence, unspecified, uncomplicated: Secondary | ICD-10-CM | POA: Diagnosis present

## 2024-07-25 DIAGNOSIS — Z86008 Personal history of in-situ neoplasm of other site: Secondary | ICD-10-CM

## 2024-07-25 DIAGNOSIS — Z8541 Personal history of malignant neoplasm of cervix uteri: Secondary | ICD-10-CM | POA: Diagnosis not present

## 2024-07-25 DIAGNOSIS — I4891 Unspecified atrial fibrillation: Secondary | ICD-10-CM

## 2024-07-25 DIAGNOSIS — J1100 Influenza due to unidentified influenza virus with unspecified type of pneumonia: Secondary | ICD-10-CM | POA: Diagnosis not present

## 2024-07-25 DIAGNOSIS — R0603 Acute respiratory distress: Secondary | ICD-10-CM

## 2024-07-25 DIAGNOSIS — R636 Underweight: Secondary | ICD-10-CM | POA: Diagnosis present

## 2024-07-25 LAB — BLOOD GAS, VENOUS
Acid-base deficit: 0.3 mmol/L (ref 0.0–2.0)
Bicarbonate: 24.1 mmol/L (ref 20.0–28.0)
O2 Saturation: 95.7 %
Patient temperature: 37
pCO2, Ven: 38 mmHg — ABNORMAL LOW (ref 44–60)
pH, Ven: 7.41 (ref 7.25–7.43)
pO2, Ven: 74 mmHg — ABNORMAL HIGH (ref 32–45)

## 2024-07-25 LAB — CBC
HCT: 45.3 % (ref 36.0–46.0)
Hemoglobin: 14.7 g/dL (ref 12.0–15.0)
MCH: 28.8 pg (ref 26.0–34.0)
MCHC: 32.5 g/dL (ref 30.0–36.0)
MCV: 88.6 fL (ref 80.0–100.0)
Platelets: 93 K/uL — ABNORMAL LOW (ref 150–400)
RBC: 5.11 MIL/uL (ref 3.87–5.11)
RDW: 15.1 % (ref 11.5–15.5)
WBC: 5.1 K/uL (ref 4.0–10.5)
nRBC: 0 % (ref 0.0–0.2)

## 2024-07-25 LAB — COMPREHENSIVE METABOLIC PANEL WITH GFR
ALT: 10 U/L (ref 0–44)
AST: 24 U/L (ref 15–41)
Albumin: 3.7 g/dL (ref 3.5–5.0)
Alkaline Phosphatase: 108 U/L (ref 38–126)
Anion gap: 11 (ref 5–15)
BUN: 13 mg/dL (ref 6–20)
CO2: 21 mmol/L — ABNORMAL LOW (ref 22–32)
Calcium: 8.8 mg/dL — ABNORMAL LOW (ref 8.9–10.3)
Chloride: 106 mmol/L (ref 98–111)
Creatinine, Ser: 1.05 mg/dL — ABNORMAL HIGH (ref 0.44–1.00)
GFR, Estimated: >60 mL/min
Glucose, Bld: 124 mg/dL — ABNORMAL HIGH (ref 70–99)
Potassium: 3.7 mmol/L (ref 3.5–5.1)
Sodium: 138 mmol/L (ref 135–145)
Total Bilirubin: 0.3 mg/dL (ref 0.0–1.2)
Total Protein: 6.3 g/dL — ABNORMAL LOW (ref 6.5–8.1)

## 2024-07-25 LAB — CBC WITH DIFFERENTIAL/PLATELET
Abs Immature Granulocytes: 0.01 K/uL (ref 0.00–0.07)
Basophils Absolute: 0 K/uL (ref 0.0–0.1)
Basophils Relative: 0 %
Eosinophils Absolute: 0 K/uL (ref 0.0–0.5)
Eosinophils Relative: 0 %
HCT: 45.7 % (ref 36.0–46.0)
Hemoglobin: 15.1 g/dL — ABNORMAL HIGH (ref 12.0–15.0)
Immature Granulocytes: 0 %
Lymphocytes Relative: 30 %
Lymphs Abs: 1.6 K/uL (ref 0.7–4.0)
MCH: 28.9 pg (ref 26.0–34.0)
MCHC: 33 g/dL (ref 30.0–36.0)
MCV: 87.5 fL (ref 80.0–100.0)
Monocytes Absolute: 0.7 K/uL (ref 0.1–1.0)
Monocytes Relative: 13 %
Neutro Abs: 3.1 K/uL (ref 1.7–7.7)
Neutrophils Relative %: 57 %
Platelets: 95 K/uL — ABNORMAL LOW (ref 150–400)
RBC: 5.22 MIL/uL — ABNORMAL HIGH (ref 3.87–5.11)
RDW: 15 % (ref 11.5–15.5)
WBC: 5.4 K/uL (ref 4.0–10.5)
nRBC: 0 % (ref 0.0–0.2)

## 2024-07-25 LAB — RESP PANEL BY RT-PCR (RSV, FLU A&B, COVID)  RVPGX2
Influenza A by PCR: POSITIVE — AB
Influenza B by PCR: NEGATIVE
Resp Syncytial Virus by PCR: NEGATIVE
SARS Coronavirus 2 by RT PCR: POSITIVE — AB

## 2024-07-25 LAB — CREATININE, SERUM
Creatinine, Ser: 1.08 mg/dL — ABNORMAL HIGH (ref 0.44–1.00)
GFR, Estimated: 59 mL/min — ABNORMAL LOW

## 2024-07-25 LAB — MRSA NEXT GEN BY PCR, NASAL: MRSA by PCR Next Gen: NOT DETECTED

## 2024-07-25 LAB — TROPONIN T, HIGH SENSITIVITY
Troponin T High Sensitivity: 48 ng/L — ABNORMAL HIGH (ref 0–19)
Troponin T High Sensitivity: 48 ng/L — ABNORMAL HIGH (ref 0–19)

## 2024-07-25 LAB — LACTATE DEHYDROGENASE: LDH: 344 U/L — ABNORMAL HIGH (ref 105–235)

## 2024-07-25 LAB — MAGNESIUM: Magnesium: 1.9 mg/dL (ref 1.7–2.4)

## 2024-07-25 LAB — PRO BRAIN NATRIURETIC PEPTIDE: Pro Brain Natriuretic Peptide: 3282 pg/mL — ABNORMAL HIGH

## 2024-07-25 LAB — TSH: TSH: 0.411 u[IU]/mL (ref 0.350–4.500)

## 2024-07-25 MED ORDER — RILPIVIRINE HCL 25 MG PO TABS
25.0000 mg | ORAL_TABLET | Freq: Every day | ORAL | Status: DC
  Administered 2024-07-25 – 2024-07-26 (×2): 25 mg via ORAL
  Filled 2024-07-25 (×4): qty 1

## 2024-07-25 MED ORDER — METHYLPREDNISOLONE SODIUM SUCC 40 MG IJ SOLR
40.0000 mg | Freq: Two times a day (BID) | INTRAMUSCULAR | Status: AC
  Administered 2024-07-25 – 2024-07-26 (×2): 40 mg via INTRAVENOUS
  Filled 2024-07-25 (×2): qty 1

## 2024-07-25 MED ORDER — DOXYCYCLINE HYCLATE 100 MG PO TABS
100.0000 mg | ORAL_TABLET | Freq: Two times a day (BID) | ORAL | Status: DC
  Administered 2024-07-25 – 2024-07-27 (×4): 100 mg via ORAL
  Filled 2024-07-25 (×4): qty 1

## 2024-07-25 MED ORDER — OSELTAMIVIR PHOSPHATE 75 MG PO CAPS
75.0000 mg | ORAL_CAPSULE | Freq: Once | ORAL | Status: AC
  Administered 2024-07-25: 75 mg via ORAL
  Filled 2024-07-25 (×2): qty 1

## 2024-07-25 MED ORDER — ONDANSETRON HCL 4 MG/2ML IJ SOLN
4.0000 mg | Freq: Four times a day (QID) | INTRAMUSCULAR | Status: DC | PRN
  Administered 2024-07-25: 4 mg via INTRAVENOUS
  Filled 2024-07-25: qty 2

## 2024-07-25 MED ORDER — ACETAMINOPHEN 650 MG RE SUPP: 650.0000 mg | Freq: Four times a day (QID) | RECTAL | Status: DC | PRN

## 2024-07-25 MED ORDER — OSELTAMIVIR PHOSPHATE 75 MG PO CAPS: 75.0000 mg | ORAL_CAPSULE | Freq: Two times a day (BID) | ORAL | Status: DC

## 2024-07-25 MED ORDER — OSELTAMIVIR PHOSPHATE 30 MG PO CAPS
30.0000 mg | ORAL_CAPSULE | Freq: Two times a day (BID) | ORAL | Status: DC
  Administered 2024-07-26 – 2024-07-27 (×3): 30 mg via ORAL
  Filled 2024-07-25 (×3): qty 1

## 2024-07-25 MED ORDER — METHYLPREDNISOLONE SODIUM SUCC 125 MG IJ SOLR
125.0000 mg | Freq: Once | INTRAMUSCULAR | Status: AC
  Administered 2024-07-25: 125 mg via INTRAVENOUS
  Filled 2024-07-25: qty 2

## 2024-07-25 MED ORDER — MORPHINE SULFATE (PF) 2 MG/ML IV SOLN
2.0000 mg | INTRAVENOUS | Status: DC | PRN
  Administered 2024-07-25: 2 mg via INTRAVENOUS
  Filled 2024-07-25: qty 1

## 2024-07-25 MED ORDER — HYDRALAZINE HCL 20 MG/ML IJ SOLN: 10.0000 mg | Freq: Four times a day (QID) | INTRAMUSCULAR | Status: DC | PRN

## 2024-07-25 MED ORDER — ACETAMINOPHEN 325 MG PO TABS: 650.0000 mg | ORAL_TABLET | Freq: Four times a day (QID) | ORAL | Status: DC | PRN

## 2024-07-25 MED ORDER — DARUNAVIR-COBICISTAT 800-150 MG PO TABS
1.0000 | ORAL_TABLET | Freq: Every day | ORAL | Status: DC
  Administered 2024-07-25 – 2024-07-26 (×2): 1 via ORAL
  Filled 2024-07-25 (×4): qty 1

## 2024-07-25 MED ORDER — IPRATROPIUM-ALBUTEROL 0.5-2.5 (3) MG/3ML IN SOLN
3.0000 mL | RESPIRATORY_TRACT | Status: DC
  Administered 2024-07-25 – 2024-07-26 (×6): 3 mL via RESPIRATORY_TRACT
  Filled 2024-07-25 (×6): qty 3

## 2024-07-25 MED ORDER — KETOROLAC TROMETHAMINE 30 MG/ML IJ SOLN
30.0000 mg | Freq: Once | INTRAMUSCULAR | Status: AC
  Administered 2024-07-25: 30 mg via INTRAMUSCULAR
  Filled 2024-07-25: qty 1

## 2024-07-25 MED ORDER — PREDNISONE 20 MG PO TABS
40.0000 mg | ORAL_TABLET | Freq: Every day | ORAL | Status: DC
  Administered 2024-07-26 – 2024-07-27 (×2): 40 mg via ORAL
  Filled 2024-07-25 (×2): qty 2

## 2024-07-25 MED ORDER — ONDANSETRON HCL 4 MG PO TABS: 4.0000 mg | ORAL_TABLET | Freq: Four times a day (QID) | ORAL | Status: DC | PRN

## 2024-07-25 MED ORDER — OXYCODONE HCL 5 MG PO TABS
5.0000 mg | ORAL_TABLET | ORAL | Status: DC | PRN
  Administered 2024-07-26 – 2024-07-27 (×2): 5 mg via ORAL
  Filled 2024-07-25 (×2): qty 1

## 2024-07-25 MED ORDER — ENOXAPARIN SODIUM 40 MG/0.4ML IJ SOSY
40.0000 mg | PREFILLED_SYRINGE | INTRAMUSCULAR | Status: DC
  Administered 2024-07-25 – 2024-07-26 (×2): 40 mg via SUBCUTANEOUS
  Filled 2024-07-25 (×2): qty 0.4

## 2024-07-25 MED ORDER — IPRATROPIUM-ALBUTEROL 0.5-2.5 (3) MG/3ML IN SOLN
3.0000 mL | Freq: Once | RESPIRATORY_TRACT | Status: AC
  Administered 2024-07-25: 3 mL via RESPIRATORY_TRACT
  Filled 2024-07-25: qty 3

## 2024-07-25 MED ORDER — POLYETHYLENE GLYCOL 3350 17 G PO PACK: 17.0000 g | PACK | Freq: Every day | ORAL | Status: DC | PRN

## 2024-07-25 NOTE — ED Notes (Signed)
 Pt assisted to toilet in room by this tech. Pt given cup of ice and new warm blankets.

## 2024-07-25 NOTE — ED Triage Notes (Addendum)
 Pt presents to the ED via POV from home with HA, chills, body aches and generalized fatigue since 12/25. Pt A&Ox4. Denies N/V/D.

## 2024-07-25 NOTE — Progress Notes (Signed)
 Pt transported from ER-41 to ER-6 on Servo-air with no complications

## 2024-07-25 NOTE — H&P (Signed)
 "  History and Physical    Claire Day FMW:[id number] DOB: 1965-07-31 DOA: 07/25/2024  DOS: the patient was seen and examined on 07/25/2024  PCP: Meade Bigness, MD   Patient coming from: Home  I have personally briefly reviewed patient's old medical records in [facility]  Chief Complaint: Generalized aches since 07/19/2024  HPI: Claire Day is a pleasant 58 y.o. female with medical history significant for HIV, COPD not on home oxygen, cerebral aneurysm, systolic CHF/NICM s/p AICD, ongoing persistent active tobacco use disorder, HTN, history of TIA who came into ED at [facility] by personal vehicle from home with complaints of headache, chills, body aches and generalized fatigue since 07/19/2024.  She also stated that she had a low-grade temperature with some nausea but no vomiting.  She endorses sick contact with her sister and daughter who had similar symptoms at home.  She denied any chest pain, palpitations, hematemesis, melena, dysuria, hematuria.  ED Course: Upon arrival to the ED, patient is found to be tachycardic at 117, work of breathing with tachypnea at 26, positive influenza A, positive COVID 19, troponins were 48.  EKG showed wide QRS tachycardia, chest x-ray showed no pneumonia or pneumothorax.  Patient was started on nebulization, steroid and Toradol  for pain.  She was placed on BiPAP for increased work of breathing and respiratory distress.  Hospitalist service was consulted for evaluation for admission for COPD exacerbation in the setting of COVID and flu positive.  Review of Systems:  ROS  All other systems negative except as noted in the HPI.  Past Medical History:  Diagnosis Date   Allergic rhinitis    Anemia    Aortic insufficiency    Cerebral aneurysm 02/12/2023   a.) CT head 02/12/2023:  3 x 3 mm distal LEFT M1 MCA just proximal to the bifurcation   Chronic systolic heart failure (HCC)    CKD (chronic kidney disease), stage III (HCC)    COPD (chronic  obstructive pulmonary disease) (HCC)    History of kidney stones    HIV (human immunodeficiency virus infection) (HCC) 2003   a.) Dx'd in 2003; b.) currently Tx'd with darunavir -cobicistat  + rilpivirine    HLD (hyperlipidemia)    Hydronephrosis    Hypertension    LBBB (left bundle branch block)    MI (myocardial infarction) (HCC)    Moderate mitral regurgitation    Nephrolithiasis    NICM (nonischemic cardiomyopathy) (HCC)    Pneumonia due to Streptococcus    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   a.) Medtronic device placed 09/20/2017 --> CLARIA MRI CRT-D QUAD DF-4 (SN: [id number])   Right upper lobe pulmonary nodule 02/12/2023   a.) CT imaging 02/12/2023:  9 x 7 mm   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    Thrombocytopenia    TIA (transient ischemic attack) 02/12/2023   Tobacco abuse     Past Surgical History:  Procedure Laterality Date   BI-VENTRICULAR IMPLANTABLE CARDIOVERTER DEFIBRILLATOR  (CRT-D) Left 09/20/2017   Procedure: BI-VENTRICULAR IMPLANTABLE CARDIOVERTER DEFIBRILLATOR (CRT-D); Location: [facility]; Surgeon: Nancyann Ceo, MD   CHOLECYSTECTOMY     CYSTOSCOPY W/ URETERAL STENT PLACEMENT Left 06/10/2023   Procedure: CYSTOSCOPY WITH RETROGRADE PYELOGRAM/URETERAL STENT PLACEMENT;  Surgeon: Francisca Redell BROCKS, MD;  Location: [facility];  Service: Urology;  Laterality: Left;   CYSTOSCOPY/URETEROSCOPY/HOLMIUM LASER/STENT PLACEMENT Left 07/05/2023   Procedure: CYSTOSCOPY/URETEROSCOPY/HOLMIUM LASER/STENT EXCHANGE;  Surgeon: Twylla Glendia BROCKS, MD;  Location: [facility];  Service: Urology;  Laterality: Left;  reports that she has been smoking cigarettes. She does not have any smokeless tobacco history on file. She reports that she does not drink alcohol and does not use drugs.  Allergies[1]  History reviewed. No pertinent family history.  Prior to Admission medications  Medication Sig Start Date End Date Taking? Authorizing  Provider  albuterol  (VENTOLIN  HFA) 108 (90 Base) MCG/ACT inhaler Inhale 2 puffs into the lungs every 4 (four) hours as needed for wheezing or shortness of breath. 11/14/21  Yes Cleotilde Rogue, MD  dapsone  100 MG tablet Take 100 mg by mouth at bedtime.   Yes [provider]  darunavir -cobicistat  (PREZCOBIX ) 800-150 MG tablet Take 1 tablet by mouth at bedtime. Swallow whole. Do NOT crush, break or chew tablets. Take with food.   Yes [provider]  ipratropium-albuterol  (DUONEB) 0.5-2.5 (3) MG/3ML SOLN Take 3 mLs by nebulization every 6 (six) hours as needed (Shortness of breath and wheezing). 12/05/23  Yes Amin, Sumayya, MD  mirtazapine (REMERON) 15 MG tablet Take 15 mg by mouth at bedtime. 02/19/24  Yes [provider]  rilpivirine  (EDURANT ) 25 MG TABS tablet Take 25 mg by mouth at bedtime.   Yes [provider]  Tiotropium Bromide-Olodaterol (STIOLTO RESPIMAT) 2.5-2.5 MCG/ACT AERS Inhale 2 puffs into the lungs daily as needed (wheezing, shortness of breath).   Yes [provider]    Physical Exam: Vitals:   07/25/24 1540 07/25/24 1555 07/25/24 1638 07/25/24 1652  BP: (!) 112/96   128/84  Pulse: 77 (!) 113  (!) 103  Resp: (!) 26 14  18   Temp:   98.1 F (36.7 C)   TempSrc:   Oral   SpO2:    97%  Weight:      Height:        Physical Exam   Constitutional: Alert, awake, calm, comfortable HEENT: Neck supple, on BIPAP Respiratory: Bilateral decreased air entry at the bases, scattered wheezing Cardiovascular: Regular rate and rhythm, no murmurs / rubs / gallops. No extremity edema. 2+ pedal pulses. No carotid bruits.  Abdomen: Soft, no tenderness, Bowel sounds positive.  Musculoskeletal: no clubbing / cyanosis. Good ROM, no contractures. Normal muscle tone.  Skin: no rashes, lesions, ulcers. Neurologic: CN 2-12 grossly intact. Sensation intact, No focal deficit identified Psychiatric: Alert and oriented x 3. Normal mood.    Labs on Admission: I  have personally reviewed following labs and imaging studies  CBC: Recent Labs  Lab 07/25/24 1437  WBC 5.4  NEUTROABS 3.1  HGB 15.1*  HCT 45.7  MCV 87.5  PLT 95*   Basic Metabolic Panel: Recent Labs  Lab 07/25/24 1437 07/25/24 1516  NA 138  --   K 3.7  --   CL 106  --   CO2 21*  --   GLUCOSE 124*  --   BUN 13  --   CREATININE 1.05*  --   CALCIUM 8.8*  --   MG  --  1.9   GFR: Estimated Creatinine Clearance: 41.9 mL/min (A) (by C-G formula based on SCr of 1.05 mg/dL (H)). Liver Function Tests: Recent Labs  Lab 07/25/24 1437  AST 24  ALT 10  ALKPHOS 108  BILITOT 0.3  PROT 6.3*  ALBUMIN 3.7   No results for input(s): LIPASE, AMYLASE in the last 168 hours. No results for input(s): AMMONIA in the last 168 hours. Coagulation Profile: No results for input(s): INR, PROTIME in the last 168 hours. Cardiac Enzymes: No results for input(s): CKTOTAL, CKMB, CKMBINDEX, TROPONINI, TROPONINIHS in the  last 168 hours. BNP (last 3 results) Recent Labs    12/02/23 0907  BNP 105.0*   HbA1C: No results for input(s): HGBA1C in the last 72 hours. CBG: No results for input(s): GLUCAP in the last 168 hours. Lipid Profile: No results for input(s): CHOL, HDL, LDLCALC, TRIG, CHOLHDL, LDLDIRECT in the last 72 hours. Thyroid Function Tests: Recent Labs    07/25/24 1516  TSH 0.411   Anemia Panel: No results for input(s): VITAMINB12, FOLATE, FERRITIN, TIBC, IRON, RETICCTPCT in the last 72 hours. Urine analysis:    Component Value Date/Time   COLORURINE YELLOW (A) 04/05/2024 1115   APPEARANCEUR HAZY (A) 04/05/2024 1115   APPEARANCEUR Hazy (A) 03/16/2024 1128   LABSPEC 1.009 04/05/2024 1115   PHURINE 6.0 04/05/2024 1115   GLUCOSEU NEGATIVE 04/05/2024 1115   GLUCOSEU NEG mg/dL 96/84/7988 7956   HGBUR LARGE (A) 04/05/2024 1115   BILIRUBINUR NEGATIVE 04/05/2024 1115   BILIRUBINUR Negative 03/16/2024 1128   KETONESUR NEGATIVE  04/05/2024 1115   PROTEINUR 30 (A) 04/05/2024 1115   UROBILINOGEN 0.2 04/22/2014 2209   NITRITE NEGATIVE 04/05/2024 1115   LEUKOCYTESUR SMALL (A) 04/05/2024 1115    Radiological Exams on Admission: I have personally reviewed images DG Chest 1 View Result Date: 07/25/2024 EXAM: 1 VIEW(S) XRAY OF THE CHEST 07/25/2024 02:20:08 PM COMPARISON: Chest radiograph dated 05/10/2024. CLINICAL HISTORY: Cough. FINDINGS: LUNGS AND PLEURA: Emphysema and left lung base scarring. No pleural effusion. No pneumothorax. HEART AND MEDIASTINUM: Left pectoral AICD device. No acute abnormality of the cardiac and mediastinal silhouettes. BONES AND SOFT TISSUES: No acute osseous abnormality. IMPRESSION: 1. No acute findings. Electronically signed by: Vanetta Chou MD 07/25/2024 02:44 PM EST RP Workstation: [id number]    EKG: My personal interpretation of EKG shows: Paced rhythm    Assessment/Plan Principal Problem:   COPD exacerbation (HCC) Active Problems:   HIV DISEASE   TOBACCO USER   Essential hypertension   Acute respiratory failure with hypoxia (HCC)    Assessment and Plan: 58 year old female with history of COPD not on home oxygen, HIV, tobacco use disorder, HTN who came into ED complaining of aches and pains and shortness of breath.  1.  Acute hypoxemic respiratory failure in the setting of COPD, HIV and ongoing tobacco use disorder. - Patient is positive COVID, flu. - X-ray did not show any obvious pneumonia. - She has been placed on BiPAP for increased work of breathing, tachypnea. - She Day be given nebulization, steroid IV, antibiotic doxycycline . - Continue oxygen to maintain saturation more than 90%.  2.  COVID/flu - She Day be given Tamiflu and Day consult ID and pharmacy for possible treatment for COVID-19. - Her symptoms started almost a week ago. - She has been on nebulizer and steroid and an COVID-19 protocol  3.  HIV - Patient was not able to provide me the detailed  history. - The system shows that she had CD4 count done in 2008 which was 9. - Day resume her home rilpivirine  and Prezcobix . - Infectious disease consult have been requested  4.  HTN - Does not appear to take any medications. - Continue to monitor blood pressure     DVT prophylaxis: Lovenox  Code Status: Full Code Family Communication: None Disposition Plan: Home Consults called: Pulmonary/ID Admission status: Inpatient, Step Down Unit   Nena Rebel, MD [facility] 07/25/2024, 5:04 PM        [1]  Allergies Allergen Reactions   Sulfonamide Derivatives Hives and Dermatitis   Tramadol Other (See Comments)  Upset stomach and it gave her the shakes    "

## 2024-07-25 NOTE — ED Notes (Signed)
 Notified PA that patients HR increased. EDP Nicholaus was also notified.

## 2024-07-25 NOTE — Consult Note (Signed)
" ° °  NAME:  Claire Day, MRN:  [id number], DOB:  21-Sep-1965, LOS: 0 ADMISSION DATE:  07/25/2024, CONSULTATION DATE:  07/25/2024 REFERRING MD:  Roann, CHIEF COMPLAINT:  COPD Exacerbation   History of Present Illness:  Case of a 58 year old female patient with past medical history of HIV, COPD not on home oxygen, CVA aneurysm, tobacco use presenting today to [facility] with generalized fatigue body aches and shortness of breath.  She was found to be in COPD exacerbation secondary to influenza A and COVID-19.  She was started on methylprednisolone  40 mg IV twice daily DuoNebs and placed on BiPAP.  Also started on Tamiflu.  On my encounter, she was comfortable on minimal settings on BiPAP.  Talking in full sentences.  Feeling much better.  Pertinent  Medical History  As above  Significant Hospital Events: Including procedures, antibiotic start and stop dates in addition to other pertinent events   07/15/2024 admitted to [facility]   Objective    Blood pressure 128/84, pulse (!) 103, temperature 98.1 F (36.7 C), temperature source Oral, resp. rate 18, height 5' 8 (1.727 m), weight 45.4 kg, SpO2 97%.    FiO2 (%):  [35 %] 35 % PEEP:  [5 cmH20] 5 cmH20 Pressure Support:  [5 cmH20] 5 cmH20  No intake or output data in the 24 hours ending 07/25/24 1805 Filed Weights   07/25/24 1221  Weight: 45.4 kg    Examination: General: No acute distress, comfortable on BiPAP HENT: Supple neck, reactive pupils Lungs: Faint expiratory wheezing heard bilaterally Cardiovascular: Normal S1, normal S2, regular rate and rhythm Abdomen: Soft nontender nondistended positive bowel sounds Extremities: Warm well-perfused no edema  Labs and imaging were reviewed.  Assessment and Plan  57 year old female with a past medical history of HIV, COPD, tobacco use disorder presenting to [facility] with generalized weakness and some degree of COPD exacerbation in the setting of COVID-19 as well as flu A.  # Influenza A and  COVID-19 positive # COPD with possible exacerbation # Acute hypoxic respiratory failure requiring BiPAP support  []  Agree with systemic steroids per primary team. []  Start DuoNebs every 4 hours. []  Agree with Tamiflu per ID []  Can potentially give her a break off BiPAP.  Goal SpO2 89 to 92%.  []  She currently does not meet criteria for ICU admission..  Please do not hesitate to contact us  with any questions or concerns.  I personally spent a total of 80 minutes in the care of the patient today including preparing to see the patient, getting/reviewing separately obtained history, performing a medically appropriate exam/evaluation, counseling and educating, documenting clinical information in the EHR, independently interpreting results, and communicating results.   Darrin Barn, MD [facility] 07/25/2024 6:11 PM    "

## 2024-07-25 NOTE — ED Notes (Addendum)
 Pt's heart rate noted to be fast on monitor. Confirmed fast by palpable radial pulse. 12-lead obtained at this time and Dr. Nicholaus notified.

## 2024-07-25 NOTE — ED Provider Notes (Signed)
 "   [facility] Emergency Department Provider Note     Event Date/Time   First MD Initiated Contact with Day 07/25/24 1315     (approximate)   History   Chills   HPI  Claire Day is a 58 y.o. female with past medical history of HTN, HLD, HIV, NICM, CHF, anemia, COPD, CKD, aortic insufficiency and tobacco use presents to the ED with complaint of generalized bodyaches, headache, weakness, hot and cold chills and intermittent shortness of breath progressively worsening since Christmas.  Associated symptoms include subjective low-grade fever.  She reports nausea yesterday but has since resolved today.  Day endorses sick contacts amongst sister and daughter who have had similar symptoms at home.  Day denies chest pain.    Physical Exam   Triage Vital Signs: ED Triage Vitals  Encounter Vitals Group     BP 07/25/24 1220 (!) 143/103     Girls Systolic BP Percentile --      Girls Diastolic BP Percentile --      Boys Systolic BP Percentile --      Boys Diastolic BP Percentile --      Pulse Rate 07/25/24 1220 (!) 117     Resp 07/25/24 1220 18     Temp 07/25/24 1220 97.7 F (36.5 C)     Temp Source 07/25/24 1220 Oral     SpO2 07/25/24 1220 95 %     Weight 07/25/24 1221 100 lb (45.4 kg)     Height 07/25/24 1221 5' 8 (1.727 m)     Head Circumference --      Peak Flow --      Pain Score 07/25/24 1220 3     Pain Loc --      Pain Education --      Exclude from Growth Chart --     Most recent vital signs: Vitals:   07/25/24 1540 07/25/24 1555  BP: (!) 112/96   Pulse: 77 (!) 113  Resp: (!) 26 14  Temp:    SpO2:     General Awake, no distress.  CV:  Good peripheral perfusion. Tachy.  No peripheral edema noted or palpated. RESP:  increased effort.  Mild wheezing noted in left lung field. ABD:  No distention.  Soft, non tender  ED Results / Procedures / Treatments   Labs (all labs ordered are listed, but only abnormal results are  displayed) Labs Reviewed  RESP PANEL BY RT-PCR (RSV, FLU A&B, COVID)  RVPGX2 - Abnormal; Notable for the following components:      Result Value   SARS Coronavirus 2 by RT PCR POSITIVE (*)    Influenza A by PCR POSITIVE (*)    All other components within normal limits  CBC WITH DIFFERENTIAL/PLATELET - Abnormal; Notable for the following components:   RBC 5.22 (*)    Hemoglobin 15.1 (*)    Platelets 95 (*)    All other components within normal limits  COMPREHENSIVE METABOLIC PANEL WITH GFR - Abnormal; Notable for the following components:   CO2 21 (*)    Glucose, Bld 124 (*)    Creatinine, Ser 1.05 (*)    Calcium 8.8 (*)    Total Protein 6.3 (*)    All other components within normal limits  PRO BRAIN NATRIURETIC PEPTIDE - Abnormal; Notable for the following components:   Pro Brain Natriuretic Peptide 3,282.0 (*)    All other components within normal limits  BLOOD GAS, VENOUS - Abnormal; Notable for the following  components:   pCO2, Ven 38 (*)    pO2, Ven 74 (*)    All other components within normal limits  TROPONIN T, HIGH SENSITIVITY - Abnormal; Notable for the following components:   Troponin T High Sensitivity 48 (*)    All other components within normal limits  EXPECTORATED SPUTUM ASSESSMENT W GRAM STAIN, RFLX TO RESP C  MRSA NEXT GEN BY PCR, NASAL  CULTURE, BLOOD (ROUTINE X 2)  CULTURE, BLOOD (ROUTINE X 2)  MAGNESIUM   TSH  CBC  CREATININE, SERUM  TROPONIN T, HIGH SENSITIVITY   EKG  Wide QRS tachycardia Left ventricle hypertrophy with QRS widening and repolarization abnormality  RADIOLOGY  I personally viewed and evaluated these images as part of my medical decision making, as well as reviewing the written report by the radiologist.  ED Provider Interpretation: No focal consolidation.  DG Chest 1 View Result Date: 07/25/2024 EXAM: 1 VIEW(S) XRAY OF THE CHEST 07/25/2024 02:20:08 PM COMPARISON: Chest radiograph dated 05/10/2024. CLINICAL HISTORY: Cough. FINDINGS:  LUNGS AND PLEURA: Emphysema and left lung base scarring. No pleural effusion. No pneumothorax. HEART AND MEDIASTINUM: Left pectoral AICD device. No acute abnormality of the cardiac and mediastinal silhouettes. BONES AND SOFT TISSUES: No acute osseous abnormality. IMPRESSION: 1. No acute findings. Electronically signed by: Vanetta Chou MD 07/25/2024 02:44 PM EST RP Workstation: [id number]    PROCEDURES:  Critical Care performed: Yes, see critical care procedure note(s)  .Critical Care  Performed by: Margrette Rebbeca LABOR, PA-C Authorized by: Margrette Rebbeca LABOR, PA-C   Critical care provider statement:    Critical care time (minutes):  35   Critical care time was exclusive of:  Separately billable procedures and treating other patients   Critical care was time spent personally by me on the following activities:  Examination of Day, evaluation of Day's response to treatment, pulse oximetry, re-evaluation of Day's condition and review of old charts   Care discussed with: admitting provider     MEDICATIONS ORDERED IN ED: Medications  doxycycline  (VIBRA -TABS) tablet 100 mg (has no administration in time range)  methylPREDNISolone  sodium succinate (SOLU-MEDROL ) 40 mg/mL injection 40 mg (has no administration in time range)    Followed by  predniSONE  (DELTASONE ) tablet 40 mg (has no administration in time range)  enoxaparin  (LOVENOX ) injection 40 mg (has no administration in time range)  acetaminophen  (TYLENOL ) tablet 650 mg (has no administration in time range)    Or  acetaminophen  (TYLENOL ) suppository 650 mg (has no administration in time range)  oxyCODONE  (Oxy IR/ROXICODONE ) immediate release tablet 5 mg (has no administration in time range)  morphine  (PF) 2 MG/ML injection 2 mg (has no administration in time range)  polyethylene glycol (MIRALAX / GLYCOLAX) packet 17 g (has no administration in time range)  ondansetron  (ZOFRAN ) tablet 4 mg (has no administration in time range)     Or  ondansetron  (ZOFRAN ) injection 4 mg (has no administration in time range)  ipratropium-albuterol  (DUONEB) 0.5-2.5 (3) MG/3ML nebulizer solution 3 mL (3 mLs Nebulization Given 07/25/24 1403)  ketorolac  (TORADOL ) 30 MG/ML injection 30 mg (30 mg Intramuscular Given 07/25/24 1410)  methylPREDNISolone  sodium succinate (SOLU-MEDROL ) 125 mg/2 mL injection 125 mg (125 mg Intravenous Given 07/25/24 1521)     IMPRESSION / MDM / ASSESSMENT AND PLAN / ED COURSE  I reviewed the triage vital signs and the nursing notes.  58 y.o. female presents to the emergency department for evaluation and treatment of shortness of breath. See HPI for further details.   Differential diagnosis includes, but is not limited to PNA, viral URI, COPD exacerbation, thyroid disease, electrolyte abnormality, dysrhythmia  Day's presentation is most consistent with acute presentation with potential threat to life or bodily function.  ----------------------------------------- 2:50 PM on 07/25/2024 -----------------------------------------  Patients pulse rate noted to increase to 160 bpm. This could be secondary following duoneb administration. EKG obtained by nursing staff showing Wide QRS tachycardia with LVH with QRS widening and repolarization abnormality.  Chart reviewed; initial consult cardiologist note from 3 years ago shows Day s/p ICD placement biventricular implantable cardioverter defibrillator (2019); EF 45% for nonischemic cardiomyopathy, mild aortic insufficiency with no overt heart failure.  Day was supposed to follow-up with a [facility] echo in 6 months but however she reports she has not followed up with cardiology.  The Day is on the cardiac monitor to evaluate for evidence of arrhythmia and/or significant heart rate changes.  Day will require further workup.  Discussed case with supervising physician Dr. Nicholaus who is in agreement with this care plan and  will assess Day at bedside. Will reach out to hospitalist team for admission. SpO2 remains stable at 95%.  ----------------------------------------- 3:23 PM on 07/25/2024 -----------------------------------------   Day placed on Bipap due to increased effort.  Pulse rate remains elevated. ----------------------------------------- 3:45 PM on 07/25/2024 -----------------------------------------  Day accepted to hospital team.  Transferring care.  FINAL CLINICAL IMPRESSION(S) / ED DIAGNOSES   Final diagnoses:  COPD exacerbation (HCC)   Rx / DC Orders   ED Discharge Orders     None      Note:  This document was prepared using Dragon voice recognition software and may include unintentional dictation errors.    Margrette Monte A, PA-C 07/25/24 1624    Nicholaus Rolland BRAVO, MD 07/25/24 [phone]  "

## 2024-07-25 NOTE — Consult Note (Signed)
 NAME: Claire Day  DOB: 1966/03/10  MRN: [id number]  Date/Time: 07/25/2024 5:14 PM  REQUESTING PROVIDER Dr.paudel Subjective:  REASON FOR CONSULT: HIV, Flu, Covid ? Claire Day is a 58 y.o. with a history of HIV/AIDS diagnosed in 2002 on Darunavir /Cobicistat  and rilpvarine , COPD, NICM, LBBB, AICD/pacemaker, CKD, nephrolithiasis, presents chills, headache, fatigue and  shortness of breath Pt started she was well tell christmas and the next day sh started having symptoms of body ache, headache and chills and then had some SOB and came to the ED Staes Sister was sick with resp symptoms before Ex smoker Vitals in the ED  07/25/24 12:20  BP 143/103 (H)  Temp 97.7 F (36.5 C)  Pulse Rate 117 !  Resp 18  SpO2 95 %    Latest Reference Range & Units 07/25/24 14:37  WBC 4.0 - 10.5 K/uL 5.4  Hemoglobin 12.0 - 15.0 g/dL 84.8 (H)  HCT 63.9 - 53.9 % 45.7  Platelets 150 - 400 K/uL 95 (L) [1]  Creatinine 0.44 - 1.00 mg/dL 8.94 (H)   CXR- hyperinflated lung fields, no infiltrate Flu A positive SARS cov 2 positive PT states she is 100% adherent to HAART- she had brought her meds with her She was in [place] and saw HIV provider there- moved to [place] in June Last Vl from [place] 2025 was undetectable  and cd4 was 66 She has been getting refills for prezcobix  and Rilpavirine every month  I am asked to see the patient for the management f the above Pt has a h/o PCP, has never been intubaed- States she is not smoking anymoreLives with her grandchildren and one of them was sick as well Pt states that she is always thin built No recent weight loss  Past Medical History:  Diagnosis Date   Allergic rhinitis    Anemia    Aortic insufficiency    Cerebral aneurysm 02/12/2023   a.) CT head 02/12/2023:  3 x 3 mm distal LEFT M1 MCA just proximal to the bifurcation   Chronic systolic heart failure (HCC)    CKD (chronic kidney disease), stage III (HCC)    COPD (chronic obstructive  pulmonary disease) (HCC)    History of kidney stones    HIV (human immunodeficiency virus infection) (HCC) 2003   a.) Dx'd in 2003; b.) currently Tx'd with darunavir -cobicistat  + rilpivirine    HLD (hyperlipidemia)    Hydronephrosis    Hypertension    LBBB (left bundle branch block)    MI (myocardial infarction) (HCC)    Moderate mitral regurgitation    Nephrolithiasis    NICM (nonischemic cardiomyopathy) (HCC)    Pneumonia due to Streptococcus    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   a.) Medtronic device placed 09/20/2017 --> CLARIA MRI CRT-D QUAD DF-4 (SN: [id number])   Right upper lobe pulmonary nodule 02/12/2023   a.) CT imaging 02/12/2023:  9 x 7 mm   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    Thrombocytopenia    TIA (transient ischemic attack) 02/12/2023   Tobacco abuse     Past Surgical History:  Procedure Laterality Date   BI-VENTRICULAR IMPLANTABLE CARDIOVERTER DEFIBRILLATOR  (CRT-D) Left 09/20/2017   Procedure: BI-VENTRICULAR IMPLANTABLE CARDIOVERTER DEFIBRILLATOR (CRT-D); Location: [facility]; Surgeon: Nancyann Ceo, MD   CHOLECYSTECTOMY     CYSTOSCOPY W/ URETERAL STENT PLACEMENT Left 06/10/2023   Procedure: CYSTOSCOPY WITH RETROGRADE PYELOGRAM/URETERAL STENT PLACEMENT;  Surgeon: Francisca Redell BROCKS, MD;  Location: [facility];  Service: Urology;  Laterality: Left;  CYSTOSCOPY/URETEROSCOPY/HOLMIUM LASER/STENT PLACEMENT Left 07/05/2023   Procedure: CYSTOSCOPY/URETEROSCOPY/HOLMIUM LASER/STENT EXCHANGE;  Surgeon: Twylla Glendia BROCKS, MD;  Location: [facility];  Service: Urology;  Laterality: Left;    Social History   Socioeconomic History   Marital status: Widowed    Spouse name: Not on file   Number of children: Not on file   Years of education: Not on file   Highest education level: Not on file  Occupational History   Not on file  Tobacco Use   Smoking status: Every Day    Current packs/day: 0.50    Types: Cigarettes    Smokeless tobacco: Not on file  Vaping Use   Vaping status: Not on file  Substance and Sexual Activity   Alcohol use: No   Drug use: No   Sexual activity: Yes  Other Topics Concern   Not on file  Social History Narrative   Not on file   Social Drivers of Health   Tobacco Use: High Risk (07/25/2024)   Patient History    Smoking Tobacco Use: Every Day    Smokeless Tobacco Use: Unknown    Passive Exposure: Not on file  Financial Resource Strain: Not on file  Food Insecurity: No Food Insecurity (12/02/2023)   Hunger Vital Sign    Worried About Running Out of Food in the Last Year: Never true    Ran Out of Food in the Last Year: Never true  Transportation Needs: No Transportation Needs (12/02/2023)   PRAPARE - Administrator, Civil Service (Medical): No    Lack of Transportation (Non-Medical): No  Physical Activity: Not on file  Stress: Not on file  Social Connections: Not on file  Intimate Partner Violence: Not At Risk (12/02/2023)   Humiliation, Afraid, Rape, and Kick questionnaire    Fear of Current or Ex-Partner: No    Emotionally Abused: No    Physically Abused: No    Sexually Abused: No  Depression (PHQ2-9): Not on file  Alcohol Screen: Not on file  Housing: High Risk (12/02/2023)   Housing Stability Vital Sign    Unable to Pay for Housing in the Last Year: Yes    Number of Times Moved in the Last Year: 0    Homeless in the Last Year: No  Utilities: Not At Risk (12/02/2023)   [facility] Utilities    Threatened with loss of utilities: No  Health Literacy: Not on file    History reviewed. No pertinent family history. Allergies[1] I? Current Facility-Administered Medications  Medication Dose Route Frequency Provider Last Rate Last Admin   acetaminophen  (TYLENOL ) tablet 650 mg  650 mg Oral Q6H PRN Roann Gouty, MD       Or   acetaminophen  (TYLENOL ) suppository 650 mg  650 mg Rectal Q6H PRN Roann Gouty, MD       darunavir -cobicistat  (PREZCOBIX ) 800-150 MG per  tablet 1 tablet  1 tablet Oral QHS Paudel, Gouty, MD       doxycycline  (VIBRA -TABS) tablet 100 mg  100 mg Oral Q12H Paudel, Keshab, MD       enoxaparin  (LOVENOX ) injection 40 mg  40 mg Subcutaneous Q24H Paudel, Keshab, MD   40 mg at 07/25/24 1641   hydrALAZINE  (APRESOLINE ) injection 10 mg  10 mg Intravenous Q6H PRN Paudel, Keshab, MD       methylPREDNISolone  sodium succinate (SOLU-MEDROL ) 40 mg/mL injection 40 mg  40 mg Intravenous Q12H Paudel, Gouty, MD   40 mg at 07/25/24 1639   Followed by   NOREEN ON 07/26/2024]  predniSONE  (DELTASONE ) tablet 40 mg  40 mg Oral Q breakfast Paudel, Nena, MD       morphine  (PF) 2 MG/ML injection 2 mg  2 mg Intravenous Q4H PRN Paudel, Keshab, MD   2 mg at 07/25/24 1648   ondansetron  (ZOFRAN ) tablet 4 mg  4 mg Oral Q6H PRN Paudel, Keshab, MD       Or   ondansetron  (ZOFRAN ) injection 4 mg  4 mg Intravenous Q6H PRN Paudel, Keshab, MD   4 mg at 07/25/24 1648   oxyCODONE  (Oxy IR/ROXICODONE ) immediate release tablet 5 mg  5 mg Oral Q4H PRN Paudel, Keshab, MD       polyethylene glycol (MIRALAX / GLYCOLAX) packet 17 g  17 g Oral Daily PRN Paudel, Keshab, MD       rilpivirine  (EDURANT ) tablet 25 mg  25 mg Oral QHS Paudel, Keshab, MD       Current Outpatient Medications  Medication Sig Dispense Refill   albuterol  (VENTOLIN  HFA) 108 (90 Base) MCG/ACT inhaler Inhale 2 puffs into the lungs every 4 (four) hours as needed for wheezing or shortness of breath. 1 each 3   dapsone  100 MG tablet Take 100 mg by mouth at bedtime.     darunavir -cobicistat  (PREZCOBIX ) 800-150 MG tablet Take 1 tablet by mouth at bedtime. Swallow whole. Do NOT crush, break or chew tablets. Take with food.     ipratropium-albuterol  (DUONEB) 0.5-2.5 (3) MG/3ML SOLN Take 3 mLs by nebulization every 6 (six) hours as needed (Shortness of breath and wheezing). 360 mL 1   mirtazapine (REMERON) 15 MG tablet Take 15 mg by mouth at bedtime.     rilpivirine  (EDURANT ) 25 MG TABS tablet Take 25 mg by mouth at  bedtime.     Tiotropium Bromide-Olodaterol (STIOLTO RESPIMAT) 2.5-2.5 MCG/ACT AERS Inhale 2 puffs into the lungs daily as needed (wheezing, shortness of breath).       Abtx:  Anti-infectives (From admission, onward)    Start     Dose/Rate Route Frequency Ordered Stop   07/25/24 2200  doxycycline  (VIBRA -TABS) tablet 100 mg        100 mg Oral Every 12 hours 07/25/24 1624 07/30/24 2159   07/25/24 2200  darunavir -cobicistat  (PREZCOBIX ) 800-150 MG per tablet 1 tablet       Note to Pharmacy: Swallow whole. Do NOT crush, break or chew tablets. Take with food.     1 tablet Oral Daily at bedtime 07/25/24 1702     07/25/24 2200  rilpivirine  (EDURANT ) tablet 25 mg        25 mg Oral Daily at bedtime 07/25/24 1702         REVIEW OF SYSTEMS:  Const: negative fever, ++ chills, negative weight loss Eyes: negative diplopia or visual changes, negative eye pain ENT: negative coryza, negative sore throat Resp:+ cough, ++ dyspnea Cards: negative for chest pain, palpitations, lower extremity edema GU: negative for frequency, dysuria and hematuria GI: Negative for abdominal pain, diarrhea, bleeding, constipation Skin: negative for rash and pruritus Heme: negative for easy bruising and gum/nose bleeding MS: weakness Neurolo:negative for headaches, dizziness, vertigo, memory problems  Psych: negative for feelings of anxiety, depression  Endocrine: negative for thyroid, diabetes Allergy/Immunology- sulfa VITALS:  BP 128/84 (BP Location: Right Arm)   Pulse (!) 103   Temp 98.1 F (36.7 C) (Oral)   Resp 18   Ht 5' 8 (1.727 m)   Wt 45.4 kg   SpO2 97%   BMI 15.20 kg/m   PHYSICAL EXAM:  General:  Alert, cooperative, Tachypnea on BIPAP emaciated Head: Normocephalic, without obvious abnormality, atraumatic. Eyes: cannot be examined Neck: Supple, symmetrical, no adenopathy, thyroid: non tender no carotid bruit and no JVD.  Lungs: b/l air entry YD:[id number] Regular rate and rhythm, no murmur,  rub or gallop. Abdomen: Soft, non-tender,not distended. Bowel sounds normal. No masses Extremities: atraumatic, no cyanosis. No edema. No clubbing Skin: No rashes or lesions. Or bruising Lymph: Cervical, supraclavicular normal. Neurologic: Grossly non-focal Pertinent Labs Lab Results CBC    Component Value Date/Time   WBC 5.4 07/25/2024 1437   RBC 5.22 (H) 07/25/2024 1437   HGB 15.1 (H) 07/25/2024 1437   HCT 45.7 07/25/2024 1437   PLT 95 (L) 07/25/2024 1437   MCV 87.5 07/25/2024 1437   MCH 28.9 07/25/2024 1437   MCHC 33.0 07/25/2024 1437   RDW 15.0 07/25/2024 1437   LYMPHSABS 1.6 07/25/2024 1437   MONOABS 0.7 07/25/2024 1437   EOSABS 0.0 07/25/2024 1437   BASOSABS 0.0 07/25/2024 1437       Latest Ref Rng & Units 07/25/2024    2:37 PM 05/10/2024    4:25 PM 04/05/2024   10:20 AM  CMP  Glucose 70 - 99 mg/dL 875  896  78   BUN 6 - 20 mg/dL 13  16  16    Creatinine 0.44 - 1.00 mg/dL 8.94  8.68  8.91   Sodium 135 - 145 mmol/L 138  138  142   Potassium 3.5 - 5.1 mmol/L 3.7  4.1  4.2   Chloride 98 - 111 mmol/L 106  102  108   CO2 22 - 32 mmol/L 21  24  24    Calcium 8.9 - 10.3 mg/dL 8.8  9.1  8.7   Total Protein 6.5 - 8.1 g/dL 6.3   6.8   Total Bilirubin 0.0 - 1.2 mg/dL 0.3   0.4   Alkaline Phos 38 - 126 U/L 108   97   AST 15 - 41 U/L 24   17   ALT 0 - 44 U/L 10   8       Microbiology: Recent Results (from the past 240 hours)  Resp panel by RT-PCR (RSV, Flu A&B, Covid) Anterior Nasal Swab     Status: Abnormal   Collection Time: 07/25/24  2:48 PM   Specimen: Anterior Nasal Swab  Result Value Ref Range Status   SARS Coronavirus 2 by RT PCR POSITIVE (A) NEGATIVE Final    Comment: (NOTE) SARS-CoV-2 target nucleic acids are DETECTED.  The SARS-CoV-2 RNA is generally detectable in upper respiratory specimens during the acute phase of infection. Positive results are indicative of the presence of the identified virus, but do not rule out bacterial infection or co-infection  with other pathogens not detected by the test. Clinical correlation with patient history and other diagnostic information is necessary to determine patient infection status. The expected result is Negative.  Fact Sheet for Patients: bloggercourse.com  Fact Sheet for Healthcare Providers: seriousbroker.it  This test is not yet approved or cleared by the [place]  FDA and  has been authorized for detection and/or diagnosis of SARS-CoV-2 by FDA under an Emergency Use Authorization (EUA).  This EUA will remain in effect (meaning this test can be used) for the duration of  the COVID-19 declaration under Section 564(b)(1) of the A ct, 21 U.S.C. section 360bbb-3(b)(1), unless the authorization is terminated or revoked sooner.     Influenza A by PCR POSITIVE (A) NEGATIVE Final   Influenza B by PCR NEGATIVE NEGATIVE  Final    Comment: (NOTE) The Xpert Xpress SARS-CoV-2/FLU/RSV plus assay is intended as an aid in the diagnosis of influenza from Nasopharyngeal swab specimens and should not be used as a sole basis for treatment. Nasal washings and aspirates are unacceptable for Xpert Xpress SARS-CoV-2/FLU/RSV testing.  Fact Sheet for Patients: bloggercourse.com  Fact Sheet for Healthcare Providers: seriousbroker.it  This test is not yet approved or cleared by the [place]  FDA and has been authorized for detection and/or diagnosis of SARS-CoV-2 by FDA under an Emergency Use Authorization (EUA). This EUA will remain in effect (meaning this test can be used) for the duration of the COVID-19 declaration under Section 564(b)(1) of the Act, 21 U.S.C. section 360bbb-3(b)(1), unless the authorization is terminated or revoked.     Resp Syncytial Virus by PCR NEGATIVE NEGATIVE Final    Comment: (NOTE) Fact Sheet for Patients: bloggercourse.com  Fact Sheet for  Healthcare Providers: seriousbroker.it  This test is not yet approved or cleared by the [place]  FDA and has been authorized for detection and/or diagnosis of SARS-CoV-2 by FDA under an Emergency Use Authorization (EUA). This EUA will remain in effect (meaning this test can be used) for the duration of the COVID-19 declaration under Section 564(b)(1) of the Act, 21 U.S.C. section 360bbb-3(b)(1), unless the authorization is terminated or revoked.  Performed at [facility], [address] [place], [place] [address]     Lines and Device Date on insertion # of days DC  Central line     Foley     ETT      Patient has: []  acute illness w/systemic sxs  [mod] []  illness posing risk to life or function  [high]  I reviewed:  (3+) []  primary team note []  consultant note(s) []  procedure/op note(s) []  micro result(s)   []  CBC results []  chemistry results []  radiology report(s) []  nursing note(s)  I independently visualized:  (any)   []  cxs/plates in lab []  plain film images []  CT images []  PET images   []  path slide(s) []  ECG tracing []  MRI images []  nuclear scan  I discussed: (any) []  micro and/or path w/lab personnel []  drug options and/or interactions w/ID pharmD   []  procedure/OR findings w/other MD(s) []  echo and/or imaging w/other MD(s)   []  mgm't w/attending(s) involved in case []  setting up home abx w/OPAT team  Mgm't requires: []  prescription drug(s)  [mod] []  intensive toxicity monitoring  [high]   EKG Paced rhythm  IMAGING RESULTS: Hyperinflated lung fields   I have personally reviewed the films ? Impression/Recommendation Acute hypoxic respiratory failure secondary to COPD exacerbation Aggravated by Influenza A And COVID The CT value for SARS cov 2 is 27.5- ( around 14 indicates an acute infection) Start Tamiflu Past h/o PCP but I doubt this iscurrently present as this is ana acute episode and also patient has been taking HAART  though cd4 is low  AIDS- on prezcobix  and rilpavirine- she claims she is very adherent Will get VL and cd4 Continue HAART  NICM Pacemaker /AICD in place  CKD  Thrombocytopenia- chronic  Past h/o PJP  Discussed the management with patient, intensivist and Hospitalist  This consult involved complex antiviral management  _____      [1]  Allergies Allergen Reactions   Sulfonamide Derivatives Hives and Dermatitis   Tramadol Other (See Comments)    Upset stomach and it gave her the shakes

## 2024-07-26 ENCOUNTER — Other Ambulatory Visit: Payer: Self-pay

## 2024-07-26 DIAGNOSIS — R0603 Acute respiratory distress: Secondary | ICD-10-CM | POA: Diagnosis not present

## 2024-07-26 DIAGNOSIS — J09X1 Influenza due to identified novel influenza A virus with pneumonia: Secondary | ICD-10-CM | POA: Diagnosis not present

## 2024-07-26 DIAGNOSIS — J1282 Pneumonia due to coronavirus disease 2019: Secondary | ICD-10-CM | POA: Diagnosis not present

## 2024-07-26 DIAGNOSIS — U071 COVID-19: Secondary | ICD-10-CM | POA: Diagnosis not present

## 2024-07-26 DIAGNOSIS — J441 Chronic obstructive pulmonary disease with (acute) exacerbation: Secondary | ICD-10-CM | POA: Diagnosis not present

## 2024-07-26 DIAGNOSIS — J1100 Influenza due to unidentified influenza virus with unspecified type of pneumonia: Secondary | ICD-10-CM | POA: Diagnosis not present

## 2024-07-26 LAB — CBC
HCT: 42.7 % (ref 36.0–46.0)
Hemoglobin: 13.9 g/dL (ref 12.0–15.0)
MCH: 28.8 pg (ref 26.0–34.0)
MCHC: 32.6 g/dL (ref 30.0–36.0)
MCV: 88.4 fL (ref 80.0–100.0)
Platelets: 91 K/uL — ABNORMAL LOW (ref 150–400)
RBC: 4.83 MIL/uL (ref 3.87–5.11)
RDW: 15 % (ref 11.5–15.5)
WBC: 5.8 K/uL (ref 4.0–10.5)
nRBC: 0 % (ref 0.0–0.2)

## 2024-07-26 LAB — C-REACTIVE PROTEIN: CRP: 5.2 mg/dL — ABNORMAL HIGH

## 2024-07-26 LAB — COMPREHENSIVE METABOLIC PANEL WITH GFR
ALT: 11 U/L (ref 0–44)
AST: 20 U/L (ref 15–41)
Albumin: 3.5 g/dL (ref 3.5–5.0)
Alkaline Phosphatase: 98 U/L (ref 38–126)
Anion gap: 9 (ref 5–15)
BUN: 22 mg/dL — ABNORMAL HIGH (ref 6–20)
CO2: 27 mmol/L (ref 22–32)
Calcium: 8.3 mg/dL — ABNORMAL LOW (ref 8.9–10.3)
Chloride: 103 mmol/L (ref 98–111)
Creatinine, Ser: 1.36 mg/dL — ABNORMAL HIGH (ref 0.44–1.00)
GFR, Estimated: 45 mL/min — ABNORMAL LOW
Glucose, Bld: 166 mg/dL — ABNORMAL HIGH (ref 70–99)
Potassium: 4.2 mmol/L (ref 3.5–5.1)
Sodium: 139 mmol/L (ref 135–145)
Total Bilirubin: 0.3 mg/dL (ref 0.0–1.2)
Total Protein: 6 g/dL — ABNORMAL LOW (ref 6.5–8.1)

## 2024-07-26 LAB — PROTIME-INR
INR: 0.9 (ref 0.8–1.2)
Prothrombin Time: 12.9 s (ref 11.4–15.2)

## 2024-07-26 MED ORDER — IPRATROPIUM-ALBUTEROL 0.5-2.5 (3) MG/3ML IN SOLN
3.0000 mL | Freq: Four times a day (QID) | RESPIRATORY_TRACT | Status: DC
  Filled 2024-07-26: qty 3

## 2024-07-26 MED ORDER — UMECLIDINIUM-VILANTEROL 62.5-25 MCG/ACT IN AEPB
1.0000 | INHALATION_SPRAY | Freq: Every day | RESPIRATORY_TRACT | Status: DC
  Administered 2024-07-26: 1 via RESPIRATORY_TRACT
  Filled 2024-07-26: qty 14

## 2024-07-26 MED ORDER — IPRATROPIUM-ALBUTEROL 20-100 MCG/ACT IN AERS
1.0000 | INHALATION_SPRAY | Freq: Four times a day (QID) | RESPIRATORY_TRACT | Status: DC
  Filled 2024-07-26: qty 4

## 2024-07-26 NOTE — Plan of Care (Signed)
  Problem: Education: Goal: Knowledge of risk factors and measures for prevention of condition will improve Outcome: Progressing   Problem: Coping: Goal: Psychosocial and spiritual needs will be supported Outcome: Progressing   Problem: Respiratory: Goal: Will maintain a patent airway Outcome: Progressing Goal: Complications related to the disease process, condition or treatment will be avoided or minimized Outcome: Progressing   Problem: Education: Goal: Knowledge of General Education information will improve Description: Including pain rating scale, medication(s)/side effects and non-pharmacologic comfort measures Outcome: Progressing   Problem: Health Behavior/Discharge Planning: Goal: Ability to manage health-related needs will improve Outcome: Progressing   Problem: Clinical Measurements: Goal: Ability to maintain clinical measurements within normal limits will improve Outcome: Progressing Goal: Will remain free from infection Outcome: Progressing Goal: Diagnostic test results will improve Outcome: Progressing Goal: Respiratory complications will improve Outcome: Progressing Goal: Cardiovascular complication will be avoided Outcome: Progressing   Problem: Activity: Goal: Risk for activity intolerance will decrease Outcome: Progressing   Problem: Nutrition: Goal: Adequate nutrition will be maintained Outcome: Progressing   Problem: Coping: Goal: Level of anxiety will decrease Outcome: Progressing   Problem: Elimination: Goal: Will not experience complications related to bowel motility Outcome: Progressing Goal: Will not experience complications related to urinary retention Outcome: Progressing   Problem: Pain Managment: Goal: General experience of comfort will improve and/or be controlled Outcome: Progressing   Problem: Safety: Goal: Ability to remain free from injury will improve Outcome: Progressing   Problem: Skin Integrity: Goal: Risk for impaired  skin integrity will decrease Outcome: Progressing   Problem: Education: Goal: Knowledge of disease or condition will improve Outcome: Progressing Goal: Knowledge of the prescribed therapeutic regimen will improve Outcome: Progressing Goal: Individualized Educational Video(s) Outcome: Progressing   Problem: Activity: Goal: Ability to tolerate increased activity will improve Outcome: Progressing Goal: Will verbalize the importance of balancing activity with adequate rest periods Outcome: Progressing   Problem: Respiratory: Goal: Ability to maintain a clear airway will improve Outcome: Progressing Goal: Levels of oxygenation will improve Outcome: Progressing Goal: Ability to maintain adequate ventilation will improve Outcome: Progressing

## 2024-07-26 NOTE — ED Notes (Signed)
 Pt given items to do partial bed bath. Pt independent in adls

## 2024-07-26 NOTE — ED Notes (Signed)
 Pt ambulatory to restroom with steady gate, towels provided for ADL'S, pt returned to bed without incident. Beverage provided per request. No acute needs expressed at this time. CB within reach.

## 2024-07-26 NOTE — Progress Notes (Signed)
 9 beat run of V-Tach. Assymptomatic. MD aware.

## 2024-07-26 NOTE — Progress Notes (Signed)
 " PROGRESS NOTE Claire Day    DOB: Mar 31, 1966, 59 y.o.  FMW:[id number]    Code Status: Full Code   DOA: 07/25/2024   LOS: 1  Brief hospital course  Claire Day is a 59 y.o. female with a PMH significant for  HIV, COPD not on home oxygen, cerebral aneurysm, systolic CHF/NICM s/p AICD, ongoing persistent active tobacco use disorder, HTN, history of TIA who came into ED with complaints of headache, chills, body aches and generalized fatigue since 07/19/2024.   ED Course: tachycardic at 117, work of breathing with tachypnea at 26, positive influenza A, positive COVID 19, troponins were 48.  EKG showed wide QRS tachycardia, chest x-ray showed no pneumonia or pneumothorax.  Patient was started on nebulization, steroid and Toradol  for pain.  She was placed on BiPAP for increased work of breathing and respiratory distress.    07/26/2024 -improving. Able to ambulate  Assessment & Plan  Principal Problem:   COPD exacerbation (HCC) Active Problems:   HIV DISEASE   TOBACCO USER   Essential hypertension   Acute respiratory failure with hypoxia (HCC)   Influenza A  Acute hypoxemic respiratory failure in the setting of COPD, HIV and ongoing tobacco use disorder. POSITIVE COVID and INFLUENZA - PCCM was consulted and evaluated - continue to wean O2 as tolerated - ambulate as much as tolerated - continue nebulization, steroid IV, antibiotic doxycycline . - Continue oxygen to maintain saturation more than 90%. - Tamiflu started   HIV- The system shows that she had CD4 count done in 2008 which was 9. - resume her home rilpivirine  and Prezcobix . - Infectious disease consult have been requested   HTN - Does not appear to take any medications. - Continue to monitor blood pressure  NICM Pacemaker /AICD in place  CKD   Thrombocytopenia- chronic   Past h/o PJP  Body mass index is 15.2 kg/m.  VTE ppx: enoxaparin  (LOVENOX ) injection 40 mg Start: 07/25/24 1630 SCDs Start: 07/25/24  1623  Diet:     Diet   Diet regular Room service appropriate? Yes; Fluid consistency: Thin   Consultants: ID Pulmonology   Subjective 07/26/2024    Pt reports feeling improved. Continues to have SOB with ambulation. No chest pain. Cough is improving. Known sick contact.    Objective  Blood pressure (!) 132/94, pulse 93, temperature 97.8 F (36.6 C), temperature source Oral, resp. rate 18, height 5' 8 (1.727 m), weight 45.4 kg, SpO2 96%. No intake or output data in the 24 hours ending 07/26/24 0828 Filed Weights   07/25/24 1221  Weight: 45.4 kg     Physical Exam:  General: awake, alert, NAD HEENT: atraumatic, clear conjunctiva, anicteric sclera, MMM, hearing grossly normal Respiratory: normal respiratory effort. Speaking in full sentences. No wheezing. Decreased breath sounds bilaterally Cardiovascular: extremities well perfused, quick capillary refill, normal S1/S2, RRR, no JVD, murmurs Nervous: A&O x3. no gross focal neurologic deficits, normal speech Extremities: moves all equally, no edema, normal tone Skin: dry, intact, normal temperature, normal color. No rashes, lesions or ulcers on exposed skin Psychiatry: normal mood, congruent affect  Labs   I have personally reviewed the following labs and imaging studies CBC    Component Value Date/Time   WBC 5.8 07/26/2024 0411   RBC 4.83 07/26/2024 0411   HGB 13.9 07/26/2024 0411   HCT 42.7 07/26/2024 0411   PLT 91 (L) 07/26/2024 0411   MCV 88.4 07/26/2024 0411   MCH 28.8 07/26/2024 0411   MCHC 32.6 07/26/2024 0411  RDW 15.0 07/26/2024 0411   LYMPHSABS 1.6 07/25/2024 1437   MONOABS 0.7 07/25/2024 1437   EOSABS 0.0 07/25/2024 1437   BASOSABS 0.0 07/25/2024 1437      Latest Ref Rng & Units 07/26/2024    4:11 AM 07/25/2024    6:35 PM 07/25/2024    2:37 PM  BMP  Glucose 70 - 99 mg/dL 833   875   BUN 6 - 20 mg/dL 22   13   Creatinine 9.55 - 1.00 mg/dL 8.63  8.91  8.94   Sodium 135 - 145 mmol/L 139   138   Potassium  3.5 - 5.1 mmol/L 4.2   3.7   Chloride 98 - 111 mmol/L 103   106   CO2 22 - 32 mmol/L 27   21   Calcium 8.9 - 10.3 mg/dL 8.3   8.8     DG Chest 1 View Result Date: 07/25/2024 EXAM: 1 VIEW(S) XRAY OF THE CHEST 07/25/2024 02:20:08 PM COMPARISON: Chest radiograph dated 05/10/2024. CLINICAL HISTORY: Cough. FINDINGS: LUNGS AND PLEURA: Emphysema and left lung base scarring. No pleural effusion. No pneumothorax. HEART AND MEDIASTINUM: Left pectoral AICD device. No acute abnormality of the cardiac and mediastinal silhouettes. BONES AND SOFT TISSUES: No acute osseous abnormality. IMPRESSION: 1. No acute findings. Electronically signed by: Vanetta Chou MD 07/25/2024 02:44 PM EST RP Workstation: [id number]    Disposition Plan & Communication  Patient status: Inpatient  Admitted From: Home Planned disposition location: Home Anticipated discharge date: 1/3 pending O2 weaned  Family Communication: none at bedside    Author: Marien LITTIE Piety, DO [facility] 07/26/2024, 8:28 AM   Available by Epic secure chat 7AM-7PM. If 7PM-7AM, please contact night-coverage.  [facility] contact information found on [email or web address].  "

## 2024-07-27 ENCOUNTER — Other Ambulatory Visit: Payer: Self-pay

## 2024-07-27 DIAGNOSIS — U071 COVID-19: Secondary | ICD-10-CM

## 2024-07-27 DIAGNOSIS — R0603 Acute respiratory distress: Secondary | ICD-10-CM | POA: Diagnosis not present

## 2024-07-27 DIAGNOSIS — J1100 Influenza due to unidentified influenza virus with unspecified type of pneumonia: Secondary | ICD-10-CM

## 2024-07-27 DIAGNOSIS — J441 Chronic obstructive pulmonary disease with (acute) exacerbation: Secondary | ICD-10-CM | POA: Diagnosis not present

## 2024-07-27 MED ORDER — DOXYCYCLINE HYCLATE 100 MG PO TABS
100.0000 mg | ORAL_TABLET | Freq: Two times a day (BID) | ORAL | 0 refills | Status: AC
  Filled 2024-07-27: qty 8, 4d supply, fill #0

## 2024-07-27 MED ORDER — GUAIFENESIN ER 600 MG PO TB12
600.0000 mg | ORAL_TABLET | Freq: Two times a day (BID) | ORAL | 0 refills | Status: AC | PRN
  Filled 2024-07-27: qty 10, 5d supply, fill #0

## 2024-07-27 NOTE — Progress Notes (Incomplete)
 " PROGRESS NOTE Claire Day    DOB: May 02, 1966, 59 y.o.  FMW:[id number]    Code Status: Full Code   DOA: 07/25/2024   LOS: 2  Brief hospital course  Claire Day is a 59 y.o. female with a PMH significant for  HIV, COPD not on home oxygen, cerebral aneurysm, systolic CHF/NICM s/p AICD, ongoing persistent active tobacco use disorder, HTN, history of TIA who came into ED with complaints of headache, chills, body aches and generalized fatigue since 07/19/2024.   ED Course: tachycardic at 117, work of breathing with tachypnea at 26, positive influenza A, positive COVID 19, troponins were 48.  EKG showed wide QRS tachycardia, chest x-ray showed no pneumonia or pneumothorax.  Patient was started on nebulization, steroid and Toradol  for pain.  She was placed on BiPAP for increased work of breathing and respiratory distress.    07/27/2024 -improving. Able to ambulate  Assessment & Plan  Principal Problem:   COPD exacerbation (HCC) Active Problems:   HIV DISEASE   TOBACCO USER   Essential hypertension   Acute respiratory failure with hypoxia (HCC)   Influenza A  Acute hypoxemic respiratory failure in the setting of COPD, HIV and ongoing tobacco use disorder. POSITIVE COVID and INFLUENZA - PCCM was consulted and evaluated - continue to wean O2 as tolerated - ambulate as much as tolerated - continue nebulization, steroid IV, antibiotic doxycycline . - Continue oxygen to maintain saturation more than 90%. - Tamiflu started   HIV- The system shows that she had CD4 count done in 2008 which was 9. - resume her home rilpivirine  and Prezcobix . - Infectious disease consult have been requested   HTN - Does not appear to take any medications. - Continue to monitor blood pressure  NICM Pacemaker /AICD in place  CKD   Thrombocytopenia- chronic   Past h/o PJP  Body mass index is 15.2 kg/m.  VTE ppx: enoxaparin  (LOVENOX ) injection 40 mg Start: 07/25/24 1630 SCDs Start: 07/25/24  1623  Diet:     Diet   Diet regular Room service appropriate? Yes; Fluid consistency: Thin   Consultants: ID Pulmonology   Subjective 07/27/2024    Pt reports feeling improved. Continues to have SOB with ambulation. No chest pain. Cough is improving. Known sick contact.    Objective  Blood pressure (!) 132/94, pulse 93, temperature 97.8 F (36.6 C), temperature source Oral, resp. rate 18, height 5' 8 (1.727 m), weight 45.4 kg, SpO2 96%.  Intake/Output Summary (Last 24 hours) at 07/27/2024 0746 Last data filed at 07/26/2024 2036 Gross per 24 hour  Intake 240 ml  Output --  Net 240 ml   Filed Weights   07/25/24 1221  Weight: 45.4 kg     Physical Exam:  General: awake, alert, NAD HEENT: atraumatic, clear conjunctiva, anicteric sclera, MMM, hearing grossly normal Respiratory: normal respiratory effort. Speaking in full sentences. No wheezing. Decreased breath sounds bilaterally Cardiovascular: extremities well perfused, quick capillary refill, normal S1/S2, RRR, no JVD, murmurs Nervous: A&O x3. no gross focal neurologic deficits, normal speech Extremities: moves all equally, no edema, normal tone Skin: dry, intact, normal temperature, normal color. No rashes, lesions or ulcers on exposed skin Psychiatry: normal mood, congruent affect  Labs   I have personally reviewed the following labs and imaging studies CBC    Component Value Date/Time   WBC 5.8 07/26/2024 0411   RBC 4.83 07/26/2024 0411   HGB 13.9 07/26/2024 0411   HCT 42.7 07/26/2024 0411   PLT 91 (L) 07/26/2024  0411   MCV 88.4 07/26/2024 0411   MCH 28.8 07/26/2024 0411   MCHC 32.6 07/26/2024 0411   RDW 15.0 07/26/2024 0411   LYMPHSABS 1.6 07/25/2024 1437   MONOABS 0.7 07/25/2024 1437   EOSABS 0.0 07/25/2024 1437   BASOSABS 0.0 07/25/2024 1437      Latest Ref Rng & Units 07/26/2024    4:11 AM 07/25/2024    6:35 PM 07/25/2024    2:37 PM  BMP  Glucose 70 - 99 mg/dL 833   875   BUN 6 - 20 mg/dL 22   13    Creatinine 9.55 - 1.00 mg/dL 8.63  8.91  8.94   Sodium 135 - 145 mmol/L 139   138   Potassium 3.5 - 5.1 mmol/L 4.2   3.7   Chloride 98 - 111 mmol/L 103   106   CO2 22 - 32 mmol/L 27   21   Calcium 8.9 - 10.3 mg/dL 8.3   8.8     DG Chest 1 View Result Date: 07/25/2024 EXAM: 1 VIEW(S) XRAY OF THE CHEST 07/25/2024 02:20:08 PM COMPARISON: Chest radiograph dated 05/10/2024. CLINICAL HISTORY: Cough. FINDINGS: LUNGS AND PLEURA: Emphysema and left lung base scarring. No pleural effusion. No pneumothorax. HEART AND MEDIASTINUM: Left pectoral AICD device. No acute abnormality of the cardiac and mediastinal silhouettes. BONES AND SOFT TISSUES: No acute osseous abnormality. IMPRESSION: 1. No acute findings. Electronically signed by: Vanetta Chou MD 07/25/2024 02:44 PM EST RP Workstation: [id number]    Disposition Plan & Communication  Patient status: Inpatient  Admitted From: Home Planned disposition location: Home Anticipated discharge date: 1/3 pending O2 weaned  Family Communication: none at bedside    Author: Marien LITTIE Piety, DO [facility] 07/27/2024, 7:46 AM   Available by Epic secure chat 7AM-7PM. If 7PM-7AM, please contact night-coverage.  [facility] contact information found on [email or web address].  "

## 2024-07-27 NOTE — Discharge Instructions (Signed)
 Please complete your antibiotic until it is completed.  I recommend wearing a mask around your family members and isolating if possible for the next 24-48 hours.  I have sent in mucinex to use as needed for cough and congestion  Please make an appointment with your PCP in 1-2 weeks for hospital follow up

## 2024-07-27 NOTE — Plan of Care (Signed)
" °  Problem: Education: Goal: Knowledge of risk factors and measures for prevention of condition will improve Outcome: Adequate for Discharge   Problem: Coping: Goal: Psychosocial and spiritual needs will be supported Outcome: Adequate for Discharge   Problem: Respiratory: Goal: Will maintain a patent airway Outcome: Adequate for Discharge Goal: Complications related to the disease process, condition or treatment will be avoided or minimized Outcome: Adequate for Discharge   Problem: Education: Goal: Knowledge of General Education information will improve Description: Including pain rating scale, medication(s)/side effects and non-pharmacologic comfort measures Outcome: Adequate for Discharge   Problem: Health Behavior/Discharge Planning: Goal: Ability to manage health-related needs will improve Outcome: Adequate for Discharge   Problem: Clinical Measurements: Goal: Ability to maintain clinical measurements within normal limits will improve Outcome: Adequate for Discharge Goal: Will remain free from infection Outcome: Adequate for Discharge Goal: Diagnostic test results will improve Outcome: Adequate for Discharge Goal: Respiratory complications will improve Outcome: Adequate for Discharge Goal: Cardiovascular complication will be avoided Outcome: Adequate for Discharge   Problem: Activity: Goal: Risk for activity intolerance will decrease Outcome: Adequate for Discharge   Problem: Nutrition: Goal: Adequate nutrition will be maintained Outcome: Adequate for Discharge   Problem: Coping: Goal: Level of anxiety will decrease Outcome: Adequate for Discharge   Problem: Elimination: Goal: Will not experience complications related to bowel motility Outcome: Adequate for Discharge Goal: Will not experience complications related to urinary retention Outcome: Adequate for Discharge   Problem: Pain Managment: Goal: General experience of comfort will improve and/or be  controlled Outcome: Adequate for Discharge   Problem: Safety: Goal: Ability to remain free from injury will improve Outcome: Adequate for Discharge   Problem: Skin Integrity: Goal: Risk for impaired skin integrity will decrease Outcome: Adequate for Discharge   Problem: Education: Goal: Knowledge of disease or condition will improve Outcome: Adequate for Discharge Goal: Knowledge of the prescribed therapeutic regimen will improve Outcome: Adequate for Discharge Goal: Individualized Educational Video(s) Outcome: Adequate for Discharge   Problem: Activity: Goal: Ability to tolerate increased activity will improve Outcome: Adequate for Discharge Goal: Will verbalize the importance of balancing activity with adequate rest periods Outcome: Adequate for Discharge   Problem: Respiratory: Goal: Ability to maintain a clear airway will improve Outcome: Adequate for Discharge Goal: Levels of oxygenation will improve Outcome: Adequate for Discharge Goal: Ability to maintain adequate ventilation will improve Outcome: Adequate for Discharge   "

## 2024-07-27 NOTE — Care Management Important Message (Signed)
 Important Message  Patient Details  Name: IVETTE CASTRONOVA MRN: [id number] Date of Birth: 1965-12-25   Important Message Given:  Yes - Medicare IM     Rojelio SHAUNNA Rattler 07/27/2024, 12:38 PM

## 2024-07-27 NOTE — Discharge Summary (Signed)
 " Physician Discharge Summary  Patient: Claire Day FMW:[id number] DOB: [birth year]   Code Status: Full Code Admit date: 07/25/2024 Discharge date: 07/27/2024 Disposition: Home, No home health services recommended PCP: Meade Bigness, MD  Recommendations for Outpatient Follow-up:  Follow up with PCP within 1-2 weeks Regarding general hospital follow up and preventative care  Discharge Diagnoses:  Principal Problem:   COPD exacerbation (HCC) Active Problems:   HIV DISEASE   TOBACCO USER   Essential hypertension   Acute respiratory failure with hypoxia (HCC)   Influenza A   Respiratory distress   COVID-19 virus infection   Influenza and pneumonia  Brief Hospital Course Summary: Claire Day is a 59 y.o. female with a PMH significant for  HIV, COPD not on home oxygen, cerebral aneurysm, systolic CHF/NICM s/p AICD, ongoing persistent active tobacco use disorder, HTN, history of TIA who came into ED with complaints of headache, chills, body aches and generalized fatigue since 07/19/2024.    ED Course: tachycardic at 117, work of breathing with tachypnea at 26, positive influenza A, positive COVID 19, troponins were 48.  EKG showed wide QRS tachycardia, chest x-ray showed no pneumonia or pneumothorax.   They were started on tamiflu , steroids, and supportive care with breathing treatments and pain management. She was placed on BiPAP for increased work of breathing and respiratory distress. ID was consulted in setting of HIV with low CD4 count.   She quickly improved and was able to wean from the bipap to room air on second day of admission. Even with ambulation, O2 remained >95% ORA. Her symptoms also greatly improved and she was discharged in stable condition.  She was prescribed a continued course of doxycycline  for COPD exacerbation.   All other chronic conditions were treated with home medications.    Discharge Condition: Good, improved Recommended discharge diet: Regular healthy  diet  Consultations: Pulmonology  ID  Procedures/Studies: None   Allergies as of 07/27/2024       Reactions   Sulfonamide Derivatives Hives, Dermatitis   Tramadol Other (See Comments)   Upset stomach and it gave her the shakes         Medication List     TAKE these medications    albuterol  108 (90 Base) MCG/ACT inhaler Commonly known as: VENTOLIN  HFA Inhale 2 puffs into the lungs every 4 (four) hours as needed for wheezing or shortness of breath.   dapsone  100 MG tablet Take 100 mg by mouth at bedtime.   darunavir -cobicistat  800-150 MG tablet Commonly known as: PREZCOBIX  Take 1 tablet by mouth at bedtime. Swallow whole. Do NOT crush, break or chew tablets. Take with food.   doxycycline  100 MG tablet Commonly known as: VIBRA -TABS Take 1 tablet (100 mg total) by mouth every 12 (twelve) hours for 4 days.   guaiFENesin  600 MG 12 hr tablet Commonly known as: Mucinex  Take 1 tablet (600 mg total) by mouth 2 (two) times daily as needed for up to 5 days for to loosen phlegm or cough.   ipratropium-albuterol  0.5-2.5 (3) MG/3ML Soln Commonly known as: DUONEB Take 3 mLs by nebulization every 6 (six) hours as needed (Shortness of breath and wheezing).   mirtazapine 15 MG tablet Commonly known as: REMERON Take 15 mg by mouth at bedtime.   rilpivirine  25 MG Tabs tablet Commonly known as: EDURANT  Take 25 mg by mouth at bedtime.   Stiolto Respimat 2.5-2.5 MCG/ACT Aers Generic drug: Tiotropium Bromide-Olodaterol Inhale 2 puffs into the lungs daily as needed (wheezing, shortness of  breath).        Follow-up Information     Meade Bigness, MD. Schedule an appointment as soon as possible for a visit in 1 week(s).   Specialties: Internal Medicine, Infectious Diseases Contact information: [facility] [address] [place] [address] [phone]                Subjective   Pt reports feeling well. Minimal congestion. Cough is resolving. No  SOB with ambulating.   All questions and concerns were addressed at time of discharge.  Objective  Blood pressure 123/83, pulse 88, temperature 97.7 F (36.5 C), resp. rate 18, height 5' 8 (1.727 m), weight 45.4 kg, SpO2 100%.   General: Pt is alert, awake, not in acute distress Cardiovascular: RRR, S1/S2 +, no rubs, no gallops Respiratory: CTA bilaterally, no wheezing, no rhonchi Abdominal: Soft, NT, ND, bowel sounds + Extremities: no edema, no cyanosis  The results of significant diagnostics from this hospitalization (including imaging, microbiology, ancillary and laboratory) are listed below for reference.   Imaging studies: DG Chest 1 View Result Date: 07/25/2024 EXAM: 1 VIEW(S) XRAY OF THE CHEST 07/25/2024 02:20:08 PM COMPARISON: Chest radiograph dated 05/10/2024. CLINICAL HISTORY: Cough. FINDINGS: LUNGS AND PLEURA: Emphysema and left lung base scarring. No pleural effusion. No pneumothorax. HEART AND MEDIASTINUM: Left pectoral AICD device. No acute abnormality of the cardiac and mediastinal silhouettes. BONES AND SOFT TISSUES: No acute osseous abnormality. IMPRESSION: 1. No acute findings. Electronically signed by: Vanetta Chou MD 07/25/2024 02:44 PM EST RP Workstation: [id number]   Labs: Basic Metabolic Panel: Recent Labs  Lab 07/25/24 1437 07/25/24 1516 07/25/24 1835 07/26/24 0411  NA 138  --   --  139  K 3.7  --   --  4.2  CL 106  --   --  103  CO2 21*  --   --  27  GLUCOSE 124*  --   --  166*  BUN 13  --   --  22*  CREATININE 1.05*  --  1.08* 1.36*  CALCIUM 8.8*  --   --  8.3*  MG  --  1.9  --   --    CBC: Recent Labs  Lab 07/25/24 1437 07/25/24 1835 07/26/24 0411  WBC 5.4 5.1 5.8  NEUTROABS 3.1  --   --   HGB 15.1* 14.7 13.9  HCT 45.7 45.3 42.7  MCV 87.5 88.6 88.4  PLT 95* 93* 91*   Microbiology: Results for orders placed or performed during the hospital encounter of 07/25/24  Resp panel by RT-PCR (RSV, Flu A&B, Covid) Anterior Nasal Swab      Status: Abnormal   Collection Time: 07/25/24  2:48 PM   Specimen: Anterior Nasal Swab  Result Value Ref Range Status   SARS Coronavirus 2 by RT PCR POSITIVE (A) NEGATIVE Final    Comment: (NOTE) SARS-CoV-2 target nucleic acids are DETECTED.  The SARS-CoV-2 RNA is generally detectable in upper respiratory specimens during the acute phase of infection. Positive results are indicative of the presence of the identified virus, but do not rule out bacterial infection or co-infection with other pathogens not detected by the test. Clinical correlation with patient history and other diagnostic information is necessary to determine patient infection status. The expected result is Negative.  Fact Sheet for Patients: bloggercourse.com  Fact Sheet for Healthcare Providers: seriousbroker.it  This test is not yet approved or cleared by the [place]  FDA and  has been authorized for detection and/or diagnosis of SARS-CoV-2 by  FDA under an Emergency Use Authorization (EUA).  This EUA will remain in effect (meaning this test can be used) for the duration of  the COVID-19 declaration under Section 564(b)(1) of the A ct, 21 U.S.C. section 360bbb-3(b)(1), unless the authorization is terminated or revoked sooner.     Influenza A by PCR POSITIVE (A) NEGATIVE Final   Influenza B by PCR NEGATIVE NEGATIVE Final    Comment: (NOTE) The Xpert Xpress SARS-CoV-2/FLU/RSV plus assay is intended as an aid in the diagnosis of influenza from Nasopharyngeal swab specimens and should not be used as a sole basis for treatment. Nasal washings and aspirates are unacceptable for Xpert Xpress SARS-CoV-2/FLU/RSV testing.  Fact Sheet for Patients: bloggercourse.com  Fact Sheet for Healthcare Providers: seriousbroker.it  This test is not yet approved or cleared by the [place]  FDA and has been authorized for  detection and/or diagnosis of SARS-CoV-2 by FDA under an Emergency Use Authorization (EUA). This EUA will remain in effect (meaning this test can be used) for the duration of the COVID-19 declaration under Section 564(b)(1) of the Act, 21 U.S.C. section 360bbb-3(b)(1), unless the authorization is terminated or revoked.     Resp Syncytial Virus by PCR NEGATIVE NEGATIVE Final    Comment: (NOTE) Fact Sheet for Patients: bloggercourse.com  Fact Sheet for Healthcare Providers: seriousbroker.it  This test is not yet approved or cleared by the [place]  FDA and has been authorized for detection and/or diagnosis of SARS-CoV-2 by FDA under an Emergency Use Authorization (EUA). This EUA will remain in effect (meaning this test can be used) for the duration of the COVID-19 declaration under Section 564(b)(1) of the Act, 21 U.S.C. section 360bbb-3(b)(1), unless the authorization is terminated or revoked.  Performed at [facility], [address] [place], [place] [address]   MRSA Next Gen by PCR, Nasal     Status: None   Collection Time: 07/25/24  6:35 PM   Specimen: Nasal Mucosa; Nasal Swab  Result Value Ref Range Status   MRSA by PCR Next Gen NOT DETECTED NOT DETECTED Final    Comment: (NOTE) The GeneXpert MRSA Assay (FDA approved for NASAL specimens only), is one component of a comprehensive MRSA colonization surveillance program. It is not intended to diagnose MRSA infection nor to guide or monitor treatment for MRSA infections. Test performance is not FDA approved in patients less than 7 years old. Performed at [facility], [address] [place], [place] [address]   Culture, blood (Routine X 2) w Reflex to ID Panel     Status: None (Preliminary result)   Collection Time: 07/25/24  6:35 PM   Specimen: BLOOD  Result Value Ref Range Status   Specimen Description BLOOD BLOOD RIGHT ARM  Final   Special  Requests   Final    BOTTLES DRAWN AEROBIC AND ANAEROBIC Blood Culture adequate volume   Culture   Final    NO GROWTH 2 DAYS Performed at [facility], [address], [place], [place] [address]    Report Status PENDING  Incomplete  Culture, blood (Routine X 2) w Reflex to ID Panel     Status: None (Preliminary result)   Collection Time: 07/25/24  6:35 PM   Specimen: BLOOD  Result Value Ref Range Status   Specimen Description BLOOD BLOOD LEFT ARM  Final   Special Requests   Final    BOTTLES DRAWN AEROBIC AND ANAEROBIC Blood Culture results may not be optimal due to an inadequate volume of blood received in  culture bottles   Culture   Final    NO GROWTH 2 DAYS Performed at [facility], [address] [place], [place] [address]    Report Status PENDING  Incomplete    Time coordinating discharge: Over 30 minutes  Claire LITTIE Piety, MD  [facility] 07/27/2024, 10:01 AM  "

## 2024-07-30 LAB — CULTURE, BLOOD (ROUTINE X 2)
Culture: NO GROWTH
Culture: NO GROWTH
Special Requests: ADEQUATE

## 2024-07-31 LAB — FUNGITELL BETA-D-GLUCAN: Fungitell Value:: <31.25 pg/mL

## 2024-08-13 ENCOUNTER — Emergency Department

## 2024-08-13 ENCOUNTER — Inpatient Hospital Stay

## 2024-08-13 ENCOUNTER — Other Ambulatory Visit: Payer: Self-pay

## 2024-08-13 ENCOUNTER — Inpatient Hospital Stay (HOSPITAL_COMMUNITY)
Admit: 2024-08-13 | Discharge: 2024-08-13 | Disposition: A | Discharge status: Home / Self Care | Attending: Nurse Practitioner | Admitting: Nurse Practitioner

## 2024-08-13 ENCOUNTER — Inpatient Hospital Stay
Admission: EM | Admit: 2024-08-13 | Source: Home / Self Care | Attending: Pulmonary Disease | Admitting: Pulmonary Disease

## 2024-08-13 DIAGNOSIS — N179 Acute kidney failure, unspecified: Secondary | ICD-10-CM | POA: Diagnosis present

## 2024-08-13 DIAGNOSIS — A419 Sepsis, unspecified organism: Secondary | ICD-10-CM | POA: Diagnosis present

## 2024-08-13 DIAGNOSIS — J9601 Acute respiratory failure with hypoxia: Secondary | ICD-10-CM | POA: Diagnosis not present

## 2024-08-13 DIAGNOSIS — E43 Unspecified severe protein-calorie malnutrition: Secondary | ICD-10-CM | POA: Diagnosis present

## 2024-08-13 DIAGNOSIS — R739 Hyperglycemia, unspecified: Secondary | ICD-10-CM | POA: Diagnosis present

## 2024-08-13 DIAGNOSIS — Z1152 Encounter for screening for COVID-19: Secondary | ICD-10-CM

## 2024-08-13 DIAGNOSIS — R64 Cachexia: Secondary | ICD-10-CM | POA: Diagnosis present

## 2024-08-13 DIAGNOSIS — I10 Essential (primary) hypertension: Secondary | ICD-10-CM | POA: Diagnosis not present

## 2024-08-13 DIAGNOSIS — E785 Hyperlipidemia, unspecified: Secondary | ICD-10-CM | POA: Diagnosis present

## 2024-08-13 DIAGNOSIS — B370 Candidal stomatitis: Secondary | ICD-10-CM | POA: Diagnosis present

## 2024-08-13 DIAGNOSIS — I429 Cardiomyopathy, unspecified: Secondary | ICD-10-CM | POA: Diagnosis not present

## 2024-08-13 DIAGNOSIS — J9621 Acute and chronic respiratory failure with hypoxia: Secondary | ICD-10-CM | POA: Diagnosis present

## 2024-08-13 DIAGNOSIS — E870 Hyperosmolality and hypernatremia: Secondary | ICD-10-CM | POA: Diagnosis present

## 2024-08-13 DIAGNOSIS — E860 Dehydration: Secondary | ICD-10-CM | POA: Diagnosis present

## 2024-08-13 DIAGNOSIS — N183 Chronic kidney disease, stage 3 unspecified: Secondary | ICD-10-CM | POA: Diagnosis present

## 2024-08-13 DIAGNOSIS — Z9049 Acquired absence of other specified parts of digestive tract: Secondary | ICD-10-CM

## 2024-08-13 DIAGNOSIS — I252 Old myocardial infarction: Secondary | ICD-10-CM

## 2024-08-13 DIAGNOSIS — Z79899 Other long term (current) drug therapy: Secondary | ICD-10-CM

## 2024-08-13 DIAGNOSIS — N189 Chronic kidney disease, unspecified: Secondary | ICD-10-CM | POA: Diagnosis not present

## 2024-08-13 DIAGNOSIS — J441 Chronic obstructive pulmonary disease with (acute) exacerbation: Secondary | ICD-10-CM | POA: Diagnosis present

## 2024-08-13 DIAGNOSIS — N186 End stage renal disease: Secondary | ICD-10-CM | POA: Diagnosis not present

## 2024-08-13 DIAGNOSIS — Z21 Asymptomatic human immunodeficiency virus [HIV] infection status: Secondary | ICD-10-CM | POA: Diagnosis present

## 2024-08-13 DIAGNOSIS — I5023 Acute on chronic systolic (congestive) heart failure: Secondary | ICD-10-CM | POA: Diagnosis not present

## 2024-08-13 DIAGNOSIS — Z882 Allergy status to sulfonamides status: Secondary | ICD-10-CM

## 2024-08-13 DIAGNOSIS — I420 Dilated cardiomyopathy: Secondary | ICD-10-CM | POA: Diagnosis present

## 2024-08-13 DIAGNOSIS — Z885 Allergy status to narcotic agent status: Secondary | ICD-10-CM

## 2024-08-13 DIAGNOSIS — Z95810 Presence of automatic (implantable) cardiac defibrillator: Secondary | ICD-10-CM

## 2024-08-13 DIAGNOSIS — J189 Pneumonia, unspecified organism: Secondary | ICD-10-CM | POA: Diagnosis present

## 2024-08-13 DIAGNOSIS — E162 Hypoglycemia, unspecified: Secondary | ICD-10-CM | POA: Diagnosis not present

## 2024-08-13 DIAGNOSIS — E874 Mixed disorder of acid-base balance: Secondary | ICD-10-CM | POA: Diagnosis present

## 2024-08-13 DIAGNOSIS — F17210 Nicotine dependence, cigarettes, uncomplicated: Secondary | ICD-10-CM | POA: Diagnosis present

## 2024-08-13 DIAGNOSIS — Z8673 Personal history of transient ischemic attack (TIA), and cerebral infarction without residual deficits: Secondary | ICD-10-CM

## 2024-08-13 DIAGNOSIS — I5031 Acute diastolic (congestive) heart failure: Secondary | ICD-10-CM

## 2024-08-13 DIAGNOSIS — J9622 Acute and chronic respiratory failure with hypercapnia: Secondary | ICD-10-CM | POA: Diagnosis present

## 2024-08-13 DIAGNOSIS — J9602 Acute respiratory failure with hypercapnia: Secondary | ICD-10-CM | POA: Diagnosis not present

## 2024-08-13 DIAGNOSIS — I130 Hypertensive heart and chronic kidney disease with heart failure and stage 1 through stage 4 chronic kidney disease, or unspecified chronic kidney disease: Secondary | ICD-10-CM | POA: Diagnosis present

## 2024-08-13 DIAGNOSIS — Z8541 Personal history of malignant neoplasm of cervix uteri: Secondary | ICD-10-CM

## 2024-08-13 DIAGNOSIS — I428 Other cardiomyopathies: Secondary | ICD-10-CM | POA: Diagnosis not present

## 2024-08-13 DIAGNOSIS — D696 Thrombocytopenia, unspecified: Secondary | ICD-10-CM | POA: Diagnosis present

## 2024-08-13 DIAGNOSIS — B20 Human immunodeficiency virus [HIV] disease: Secondary | ICD-10-CM | POA: Diagnosis present

## 2024-08-13 DIAGNOSIS — R6521 Severe sepsis with septic shock: Secondary | ICD-10-CM | POA: Diagnosis present

## 2024-08-13 LAB — TROPONIN T, HIGH SENSITIVITY
Troponin T High Sensitivity: 75 ng/L — ABNORMAL HIGH (ref 0–19)
Troponin T High Sensitivity: 110 ng/L (ref 0–19)
Troponin T High Sensitivity: 123 ng/L (ref 0–19)
Troponin T High Sensitivity: 126 ng/L (ref 0–19)

## 2024-08-13 LAB — RESPIRATORY PANEL BY PCR

## 2024-08-13 LAB — BLOOD GAS, ARTERIAL
Acid-base deficit: 11.9 mmol/L — ABNORMAL HIGH (ref 0.0–2.0)
Acid-base deficit: 8.4 mmol/L — ABNORMAL HIGH (ref 0.0–2.0)
Acid-base deficit: 10.9 mmol/L — ABNORMAL HIGH (ref 0.0–2.0)
Acid-base deficit: 9.2 mmol/L — ABNORMAL HIGH (ref 0.0–2.0)
Bicarbonate: 18.3 mmol/L — ABNORMAL LOW (ref 20.0–28.0)
Bicarbonate: 21.6 mmol/L (ref 20.0–28.0)
Bicarbonate: 18.9 mmol/L — ABNORMAL LOW (ref 20.0–28.0)
Bicarbonate: 19.1 mmol/L — ABNORMAL LOW (ref 20.0–28.0)
Delivery systems: POSITIVE
FIO2: 80 %
FIO2: 0.6 %
FIO2: 0.5 %
FIO2: 40 %
MECHVT: 400 mL
MECHVT: 400 mL
O2 Saturation: 100 %
O2 Saturation: 98.1 %
O2 Saturation: 97.1 %
O2 Saturation: 97.6 %
PEEP: 8 cmH2O
PEEP: 8 cmH2O
PEEP: 8 cmH2O
Patient temperature: 37
Patient temperature: 37
Patient temperature: 37
Patient temperature: 37
RATE: 15 {breaths}/min
RATE: 20 {breaths}/min
RATE: 24 {breaths}/min
Spontaneous VT: 400 mL
pCO2 arterial: 59 mmHg — ABNORMAL HIGH (ref 32–48)
pCO2 arterial: 65 mmHg — ABNORMAL HIGH (ref 32–48)
pCO2 arterial: 58 mmHg — ABNORMAL HIGH (ref 32–48)
pCO2 arterial: 50 mmHg — ABNORMAL HIGH (ref 32–48)
pH, Arterial: 7.1 — CL (ref 7.35–7.45)
pH, Arterial: 7.13 — CL (ref 7.35–7.45)
pH, Arterial: 7.12 — CL (ref 7.35–7.45)
pH, Arterial: 7.19 — CL (ref 7.35–7.45)
pO2, Arterial: 366 mmHg — ABNORMAL HIGH (ref 83–108)
pO2, Arterial: 109 mmHg — ABNORMAL HIGH (ref 83–108)
pO2, Arterial: 84 mmHg (ref 83–108)
pO2, Arterial: 87 mmHg (ref 83–108)

## 2024-08-13 LAB — CBC WITH DIFFERENTIAL/PLATELET
Abs Immature Granulocytes: 0.07 K/uL (ref 0.00–0.07)
Basophils Absolute: 0.1 K/uL (ref 0.0–0.1)
Basophils Relative: 1 %
Eosinophils Absolute: 0.1 K/uL (ref 0.0–0.5)
Eosinophils Relative: 1 %
HCT: 42.2 % (ref 36.0–46.0)
Hemoglobin: 13.2 g/dL (ref 12.0–15.0)
Immature Granulocytes: 1 %
Lymphocytes Relative: 44 %
Lymphs Abs: 4.2 K/uL — ABNORMAL HIGH (ref 0.7–4.0)
MCH: 29.2 pg (ref 26.0–34.0)
MCHC: 31.3 g/dL (ref 30.0–36.0)
MCV: 93.4 fL (ref 80.0–100.0)
Monocytes Absolute: 0.9 K/uL (ref 0.1–1.0)
Monocytes Relative: 9 %
Neutro Abs: 4.2 K/uL (ref 1.7–7.7)
Neutrophils Relative %: 44 %
Platelets: 215 K/uL (ref 150–400)
RBC: 4.52 MIL/uL (ref 3.87–5.11)
RDW: 14.5 % (ref 11.5–15.5)
WBC: 9.5 K/uL (ref 4.0–10.5)
nRBC: 0 % (ref 0.0–0.2)

## 2024-08-13 LAB — ECHOCARDIOGRAM COMPLETE
AR max vel: 2 cm2
AV Area VTI: 2.21 cm2
AV Area mean vel: 1.73 cm2
AV Mean grad: 4 mmHg
AV Peak grad: 6.8 mmHg
Ao pk vel: 1.3 m/s
Area-P 1/2: 3.79 cm2
Calc EF: 24.8 %
P 1/2 time: 469 ms
S' Lateral: 4 cm
Single Plane A2C EF: 24.2 %
Single Plane A4C EF: 25.6 %
Weight: 1305.12 [oz_av]

## 2024-08-13 LAB — GLUCOSE, CAPILLARY
Glucose-Capillary: 188 mg/dL — ABNORMAL HIGH (ref 70–99)
Glucose-Capillary: 184 mg/dL — ABNORMAL HIGH (ref 70–99)
Glucose-Capillary: 125 mg/dL — ABNORMAL HIGH (ref 70–99)
Glucose-Capillary: 188 mg/dL — ABNORMAL HIGH (ref 70–99)
Glucose-Capillary: 192 mg/dL — ABNORMAL HIGH (ref 70–99)
Glucose-Capillary: 139 mg/dL — ABNORMAL HIGH (ref 70–99)

## 2024-08-13 LAB — COMPREHENSIVE METABOLIC PANEL WITH GFR
ALT: 17 U/L (ref 0–44)
AST: 36 U/L (ref 15–41)
Albumin: 3.3 g/dL — ABNORMAL LOW (ref 3.5–5.0)
Alkaline Phosphatase: 129 U/L — ABNORMAL HIGH (ref 38–126)
Anion gap: 14 (ref 5–15)
BUN: 21 mg/dL — ABNORMAL HIGH (ref 6–20)
CO2: 18 mmol/L — ABNORMAL LOW (ref 22–32)
Calcium: 8.5 mg/dL — ABNORMAL LOW (ref 8.9–10.3)
Chloride: 109 mmol/L (ref 98–111)
Creatinine, Ser: 1.37 mg/dL — ABNORMAL HIGH (ref 0.44–1.00)
GFR, Estimated: 45 mL/min — ABNORMAL LOW
Glucose, Bld: 248 mg/dL — ABNORMAL HIGH (ref 70–99)
Potassium: 4.1 mmol/L (ref 3.5–5.1)
Sodium: 141 mmol/L (ref 135–145)
Total Bilirubin: 0.2 mg/dL (ref 0.0–1.2)
Total Protein: 5.9 g/dL — ABNORMAL LOW (ref 6.5–8.1)

## 2024-08-13 LAB — MRSA NEXT GEN BY PCR, NASAL: MRSA by PCR Next Gen: NOT DETECTED

## 2024-08-13 LAB — URINALYSIS, W/ REFLEX TO CULTURE (INFECTION SUSPECTED)
Bilirubin Urine: NEGATIVE
Glucose, UA: NEGATIVE mg/dL
Ketones, ur: NEGATIVE mg/dL
Nitrite: NEGATIVE
Protein, ur: 100 mg/dL — AB
RBC / HPF: >50 RBC/hpf (ref 0–5)
Specific Gravity, Urine: 1.014 (ref 1.005–1.030)
WBC, UA: >50 WBC/hpf (ref 0–5)
pH: 5 (ref 5.0–8.0)

## 2024-08-13 LAB — HEMOGLOBIN A1C
Hgb A1c MFr Bld: 5.6 % (ref 4.8–5.6)
Mean Plasma Glucose: 114.02 mg/dL

## 2024-08-13 LAB — PROTIME-INR
INR: 1.1 (ref 0.8–1.2)
Prothrombin Time: 14.6 s (ref 11.4–15.2)

## 2024-08-13 LAB — LACTIC ACID, PLASMA
Lactic Acid, Venous: 5 mmol/L (ref 0.5–1.9)
Lactic Acid, Venous: 1.4 mmol/L (ref 0.5–1.9)
Lactic Acid, Venous: 2.8 mmol/L (ref 0.5–1.9)
Lactic Acid, Venous: 2.7 mmol/L (ref 0.5–1.9)

## 2024-08-13 LAB — RESP PANEL BY RT-PCR (RSV, FLU A&B, COVID)  RVPGX2
Influenza A by PCR: NEGATIVE
Influenza B by PCR: NEGATIVE
Resp Syncytial Virus by PCR: NEGATIVE
SARS Coronavirus 2 by RT PCR: NEGATIVE

## 2024-08-13 LAB — COOXEMETRY PANEL
Carboxyhemoglobin: 0.9 % (ref 0.5–1.5)
Methemoglobin: <0.7 % (ref 0.0–1.5)
O2 Saturation: 98.5 %
Total hemoglobin: 13.1 g/dL (ref 12.0–16.0)
Total oxygen content: 97.3 %

## 2024-08-13 LAB — C-REACTIVE PROTEIN: CRP: <0.5 mg/dL

## 2024-08-13 LAB — STREP PNEUMONIAE URINARY ANTIGEN: Strep Pneumo Urinary Antigen: NEGATIVE

## 2024-08-13 LAB — MAGNESIUM: Magnesium: 2.4 mg/dL (ref 1.7–2.4)

## 2024-08-13 LAB — PRO BRAIN NATRIURETIC PEPTIDE: Pro Brain Natriuretic Peptide: 5944 pg/mL — ABNORMAL HIGH

## 2024-08-13 LAB — POTASSIUM: Potassium: 4.7 mmol/L (ref 3.5–5.1)

## 2024-08-13 MED ORDER — SODIUM CHLORIDE 0.9 % IV SOLN
2.0000 g | Freq: Once | INTRAVENOUS | Status: AC
  Administered 2024-08-13: 2 g via INTRAVENOUS
  Filled 2024-08-13: qty 12.5

## 2024-08-13 MED ORDER — ETOMIDATE 2 MG/ML IV SOLN
INTRAVENOUS | Status: AC
  Administered 2024-08-13: 7.5 mg
  Filled 2024-08-13: qty 10

## 2024-08-13 MED ORDER — LACTATED RINGERS IV SOLN: INTRAVENOUS | Status: DC

## 2024-08-13 MED ORDER — NOREPINEPHRINE 4 MG/250ML-% IV SOLN
0.0000 ug/min | INTRAVENOUS | Status: DC
  Administered 2024-08-13: 2 ug/min via INTRAVENOUS
  Filled 2024-08-13: qty 250

## 2024-08-13 MED ORDER — SENNA 8.6 MG PO TABS: 1.0000 | ORAL_TABLET | Freq: Two times a day (BID) | ORAL | Status: DC | PRN

## 2024-08-13 MED ORDER — SENNA 8.6 MG PO TABS
1.0000 | ORAL_TABLET | Freq: Two times a day (BID) | ORAL | Status: DC
  Administered 2024-08-13 – 2024-08-14 (×3): 8.6 mg
  Filled 2024-08-13 (×4): qty 1

## 2024-08-13 MED ORDER — IPRATROPIUM-ALBUTEROL 0.5-2.5 (3) MG/3ML IN SOLN
RESPIRATORY_TRACT | Status: AC
  Filled 2024-08-13: qty 3

## 2024-08-13 MED ORDER — POLYETHYLENE GLYCOL 3350 17 G PO PACK
17.0000 g | PACK | Freq: Every day | ORAL | Status: DC
  Administered 2024-08-13 – 2024-08-14 (×2): 17 g
  Filled 2024-08-13 (×2): qty 1

## 2024-08-13 MED ORDER — VASOPRESSIN 20 UNITS/100 ML INFUSION FOR SHOCK
0.0000 [IU]/min | INTRAVENOUS | Status: DC
  Administered 2024-08-13 (×2): 0.03 [IU]/min via INTRAVENOUS
  Administered 2024-08-14: 0.02 [IU]/min via INTRAVENOUS
  Filled 2024-08-13 (×2): qty 100

## 2024-08-13 MED ORDER — IPRATROPIUM-ALBUTEROL 0.5-2.5 (3) MG/3ML IN SOLN
3.0000 mL | Freq: Four times a day (QID) | RESPIRATORY_TRACT | Status: DC
  Administered 2024-08-13 – 2024-08-14 (×6): 3 mL via RESPIRATORY_TRACT
  Filled 2024-08-13 (×6): qty 3

## 2024-08-13 MED ORDER — FENTANYL BOLUS VIA INFUSION
25.0000 ug | INTRAVENOUS | Status: DC | PRN
  Administered 2024-08-13 – 2024-08-14 (×2): 25 ug via INTRAVENOUS
  Administered 2024-08-14: 50 ug via INTRAVENOUS

## 2024-08-13 MED ORDER — IPRATROPIUM-ALBUTEROL 0.5-2.5 (3) MG/3ML IN SOLN: 3.0000 mL | Freq: Four times a day (QID) | RESPIRATORY_TRACT | Status: DC | PRN

## 2024-08-13 MED ORDER — SODIUM CHLORIDE 0.9 % IV SOLN
100.0000 mg | Freq: Once | INTRAVENOUS | Status: AC
  Administered 2024-08-13: 100 mg via INTRAVENOUS
  Filled 2024-08-13: qty 100

## 2024-08-13 MED ORDER — PROPOFOL 1000 MG/100ML IV EMUL
INTRAVENOUS | Status: AC
  Filled 2024-08-13: qty 100

## 2024-08-13 MED ORDER — FENTANYL 2500MCG IN NS 250ML (10MCG/ML) PREMIX INFUSION
0.0000 ug/h | INTRAVENOUS | Status: DC
  Administered 2024-08-13: 25 ug/h via INTRAVENOUS
  Filled 2024-08-13: qty 250

## 2024-08-13 MED ORDER — ROCURONIUM BROMIDE 10 MG/ML (PF) SYRINGE
PREFILLED_SYRINGE | INTRAVENOUS | Status: AC
  Administered 2024-08-13: 50 mg
  Filled 2024-08-13: qty 10

## 2024-08-13 MED ORDER — POLYETHYLENE GLYCOL 3350 17 G PO PACK: 17.0000 g | PACK | Freq: Every day | ORAL | Status: DC | PRN

## 2024-08-13 MED ORDER — ORAL CARE MOUTH RINSE
15.0000 mL | OROMUCOSAL | Status: DC
  Administered 2024-08-13 – 2024-08-14 (×13): 15 mL via OROMUCOSAL

## 2024-08-13 MED ORDER — SODIUM CHLORIDE 0.9 % IV SOLN: 2.0000 g | INTRAVENOUS | Status: DC

## 2024-08-13 MED ORDER — LACTATED RINGERS IV BOLUS (SEPSIS)
500.0000 mL | Freq: Once | INTRAVENOUS | Status: AC
  Administered 2024-08-13: 500 mL via INTRAVENOUS

## 2024-08-13 MED ORDER — SODIUM CHLORIDE 0.9 % IV SOLN: 250.0000 mL | INTRAVENOUS | Status: AC

## 2024-08-13 MED ORDER — VANCOMYCIN HCL IN DEXTROSE 1-5 GM/200ML-% IV SOLN
1000.0000 mg | Freq: Once | INTRAVENOUS | Status: AC
  Administered 2024-08-13: 1000 mg via INTRAVENOUS
  Filled 2024-08-13: qty 200

## 2024-08-13 MED ORDER — CHLORHEXIDINE GLUCONATE CLOTH 2 % EX PADS
6.0000 | MEDICATED_PAD | Freq: Every day | CUTANEOUS | Status: DC
  Administered 2024-08-13 – 2024-08-14 (×2): 6 via TOPICAL
  Filled 2024-08-13: qty 6

## 2024-08-13 MED ORDER — FAMOTIDINE IN NACL 20-0.9 MG/50ML-% IV SOLN
20.0000 mg | INTRAVENOUS | Status: DC
  Administered 2024-08-13 – 2024-08-14 (×2): 20 mg via INTRAVENOUS
  Filled 2024-08-13 (×2): qty 50

## 2024-08-13 MED ORDER — SODIUM CHLORIDE 0.9 % IV SOLN
100.0000 mg | Freq: Two times a day (BID) | INTRAVENOUS | Status: DC
  Administered 2024-08-13 – 2024-08-14 (×3): 100 mg via INTRAVENOUS
  Filled 2024-08-13 (×4): qty 100

## 2024-08-13 MED ORDER — MIDAZOLAM HCL (PF) 2 MG/2ML IJ SOLN
1.0000 mg | INTRAMUSCULAR | Status: DC | PRN
  Administered 2024-08-13 (×2): 2 mg via INTRAVENOUS
  Filled 2024-08-13 (×2): qty 2

## 2024-08-13 MED ORDER — SODIUM CHLORIDE 0.9 % IV SOLN: 500.0000 mg | Freq: Once | INTRAVENOUS | Status: DC

## 2024-08-13 MED ORDER — NOREPINEPHRINE 16 MG/250ML-% IV SOLN
0.0000 ug/min | INTRAVENOUS | Status: DC
  Administered 2024-08-13: 22 ug/min via INTRAVENOUS
  Administered 2024-08-14: 17 ug/min via INTRAVENOUS
  Filled 2024-08-13 (×2): qty 250

## 2024-08-13 MED ORDER — IPRATROPIUM-ALBUTEROL 0.5-2.5 (3) MG/3ML IN SOLN
RESPIRATORY_TRACT | Status: AC
  Filled 2024-08-13: qty 6

## 2024-08-13 MED ORDER — PROPOFOL 1000 MG/100ML IV EMUL
0.0000 ug/kg/min | INTRAVENOUS | Status: DC
  Administered 2024-08-13: 30 ug/kg/min via INTRAVENOUS
  Administered 2024-08-14: 20 ug/kg/min via INTRAVENOUS
  Filled 2024-08-13 (×2): qty 100

## 2024-08-13 MED ORDER — PIPERACILLIN-TAZOBACTAM 3.375 G IVPB
3.3750 g | Freq: Three times a day (TID) | INTRAVENOUS | Status: DC
  Administered 2024-08-13 – 2024-08-14 (×3): 3.375 g via INTRAVENOUS
  Filled 2024-08-13 (×3): qty 50

## 2024-08-13 MED ORDER — INSULIN ASPART 100 UNIT/ML IJ SOLN
0.0000 [IU] | INTRAMUSCULAR | Status: DC
  Administered 2024-08-13 (×3): 3 [IU] via SUBCUTANEOUS
  Administered 2024-08-13 (×2): 2 [IU] via SUBCUTANEOUS
  Administered 2024-08-13: 3 [IU] via SUBCUTANEOUS
  Filled 2024-08-13 (×8): qty 3

## 2024-08-13 MED ORDER — ORAL CARE MOUTH RINSE: 15.0000 mL | OROMUCOSAL | Status: DC | PRN

## 2024-08-13 MED ORDER — ACETAMINOPHEN 325 MG PO TABS
650.0000 mg | ORAL_TABLET | Freq: Four times a day (QID) | ORAL | Status: DC | PRN
  Administered 2024-08-15 – 2024-08-19 (×2): 650 mg
  Filled 2024-08-13 (×2): qty 2

## 2024-08-13 MED ORDER — HEPARIN SODIUM (PORCINE) 5000 UNIT/ML IJ SOLN
5000.0000 [IU] | Freq: Three times a day (TID) | INTRAMUSCULAR | Status: DC
  Administered 2024-08-13 – 2024-08-19 (×7): 5000 [IU] via SUBCUTANEOUS
  Filled 2024-08-13 (×14): qty 1

## 2024-08-13 NOTE — Progress Notes (Signed)
 Patient transported from Emergency Department to ICU bed 2. No issues with transport.

## 2024-08-13 NOTE — Progress Notes (Signed)
 eLink Physician-Brief Progress Note Patient Name: Claire Day DOB: 10-26-1965 MRN: [id number]   Date of Service  08/13/2024  HPI/Events of Note  59 year old with a history of HIV, COPD, heart failure and previous TIA that was recently discharged 2 weeks ago for COPD exacerbation that presents to the emergency department with dyspnea and another COPD exacerbation ultimately requiring intubation mechanical ventilation.  Vitals, labs, and imaging reviewed.  Patient has mixed acidosis, acute kidney injury elevated BNP and grossly positive urinalysis.  eICU Interventions  Remanage after adjusting enteric tube  Scheduled nebs and antibiotics.  Anticipate steroids and diuresis  Discontinue continuous IVF  Mechanical ventilation, spontaneous awakening/breathing trials; increase RR to 20  DVT prophylaxis with heparin  GI prophylaxis-add famotidine      Intervention Category Evaluation Type: New Patient Evaluation  [place] 08/13/2024, 6:33 AM

## 2024-08-13 NOTE — Progress Notes (Signed)
 Initial Nutrition Assessment  DOCUMENTATION CODES:   Severe malnutrition in context of chronic illness  INTERVENTION:   Once appropriate for tube feeds, recommend:  Vital 1.2@45ml /hr- Initiate at 29ml/hr and increase by 10ml/hr q 8 hours until goal rate is reached.   Free water flushes 30ml q4 hours to maintain tube patency   Regimen provides 1296kcal/day, 81g/day protein and 1046ml/day of free water.   Pt at high refeed risk; recommend monitor potassium, magnesium  and phosphorus labs daily until stable  Thiamine  100mg  daily via tube x 7 days   B complex with C daily via tube   Daily weights   NUTRITION DIAGNOSIS:   Severe Malnutrition related to catabolic illness (COPD, HIV) as evidenced by severe muscle depletion, severe fat depletion.  GOAL:   Provide needs based on ASPEN/SCCM guidelines  MONITOR:   Vent status, Labs, Weight trends, Skin, I & O's  REASON FOR ASSESSMENT:   Ventilator    ASSESSMENT:   59 y/o female with h/o HIV, HTN, COPD, NICM, HLD, CHF, CKD III, TIA, cerebral aneurysm, AICD, MI, kidney stones and recent admission for COVID 19 and flu A and who is now admitted with CAP, COPD exacerbation and AKI.  Pt sedated and ventilated. OGT in place with side port noted at the GE junction; per MD, ok to use the tube. Pt with increasing pressor requirements and lactic acid; will hold tube feeds today. Pt is at high refeed risk. Per chart, pt is down 18lbs(19%) over the past 3 weeks if her admission weight is correct; this would be considered severe weight loss.   Medications reviewed and include: heparin , insulin , miralax , senokot, doxycycline , pepcid , levophed , zosyn , propofol , vasopressin    Labs reviewed: K 4.1 wnl, BUN 21(H), creat 1.37(H), P 2.5 wnl, Mg 1.9 wnl Cbgs- 125, 184, 188 x 24 hrs  AIC 5.6  Patient is currently intubated on ventilator support MV: 9.0 L/min Temp (24hrs), Avg:97.9 F (36.6 C), Min:96.3 F (35.7 C), Max:99.3 F (37.4  C)  Propofol : 6.66 ml/hr- provides 176kcal/day   MAP >63mmHg   NUTRITION - FOCUSED PHYSICAL EXAM:  Flowsheet Row Most Recent Value  Orbital Region Moderate depletion  Upper Arm Region Severe depletion  Thoracic and Lumbar Region Severe depletion  Buccal Region Moderate depletion  Temple Region Moderate depletion  Clavicle Bone Region Severe depletion  Clavicle and Acromion Bone Region Severe depletion  Scapular Bone Region Severe depletion  Dorsal Hand Severe depletion  Patellar Region Severe depletion  Anterior Thigh Region Severe depletion  Posterior Calf Region Severe depletion  Edema (RD Assessment) None  Hair Reviewed  Eyes Reviewed  Mouth Reviewed  Skin Reviewed  Nails Reviewed   Diet Order:   Diet Order             Diet NPO time specified  Diet effective now                  EDUCATION NEEDS:   No education needs have been identified at this time  Skin:  Skin Assessment: Reviewed RN Assessment  Last BM:  PTA  Height:   Ht Readings from Last 1 Encounters:  07/25/24 5' 8 (1.727 m)    Weight:   Wt Readings from Last 1 Encounters:  08/13/24 37 kg    Ideal Body Weight:  63.6 kg  BMI:  Body mass index is 12.4 kg/m.  Estimated Nutritional Needs:   Kcal:  1304kcal/day  Protein:  65-75g/day  Fluid:  1.2-1.4L/day  Augustin Shams MS, RD, LDN If unable to  be reached, please send secure chat to RD inpatient available from 8:00a-4:00p daily

## 2024-08-13 NOTE — Sepsis Progress Note (Signed)
 Elink following code sepsis

## 2024-08-13 NOTE — ED Triage Notes (Signed)
 As per EMS the patient is SOB, and altered

## 2024-08-13 NOTE — Sepsis Progress Note (Signed)
 Full dose fluids per sepsis protocol contraindicated. Pressor given for hypotension.

## 2024-08-13 NOTE — Procedures (Signed)
 Central Venous Catheter Insertion Procedure Note  Claire Day  [id number]  Feb 24, 1966  Date:08/13/24  Time:12:03 PM   Provider Performing:Skyelynn Rambeau D Shellia   Procedure: Insertion of Non-tunneled Central Venous [phone]) with US  guidance (23062)   Indication(s) Medication administration and Difficult access  Consent Risks of the procedure as well as the alternatives and risks of each were explained to the patient and/or caregiver.  Consent for the procedure was obtained and is signed in the bedside chart  Anesthesia Topical only with 1% lidocaine    Timeout Verified patient identification, verified procedure, site/side was marked, verified correct patient position, special equipment/implants available, medications/allergies/relevant history reviewed, required imaging and test results available.  Sterile Technique Maximal sterile technique including full sterile barrier drape, hand hygiene, sterile gown, sterile gloves, mask, hair covering, sterile ultrasound probe cover (if used).  Procedure Description Area of catheter insertion was cleaned with chlorhexidine  and draped in sterile fashion.  With real-time ultrasound guidance a central venous catheter was placed into the right internal jugular vein. Nonpulsatile blood flow and easy flushing noted in all ports.  The catheter was sutured in place and sterile dressing applied.  Complications/Tolerance None; patient tolerated the procedure well. Chest X-ray is ordered to verify placement for internal jugular or subclavian cannulation.   Chest x-ray is not ordered for femoral cannulation.  EBL Minimal  Specimen(s) None   Line inserted to the 16 cm mark.    Inge Shellia, AGACNP-BC [facility] Prefer epic messenger for cross cover needs If after hours, please call E-link

## 2024-08-13 NOTE — Consult Note (Signed)
 NAME: Claire Day  DOB: January 28, 1966  MRN: [id number]  Date/Time: 08/13/2024 11:30 AM  REQUESTING PROVIDER: Dr. parris Subjective:  REASON FOR CONSULT: AIDS ?no h/o available from patient- she is intubated Claire Day is a 59 y.o. female with a history of HIV/AIDS diagnosed in 2002 on darunavir /cobicistat  and rilpivirine , COPD, nonischemic cardiomyopathy, LBBB, AICD/pacemaker, CKD, nephrolithiasis was recently in the hospital with COPD exacerbation and also had flu A and COVID infections.  She was treated with Tamiflu  under HIV medications were continued.  She was discharged on 07/27/2024 She presents to the ED he on 08/13/2024 with shortness of breath and tripoding.  She had received magnesium  IV Vitals in the ED BP of 156/107, heart rate 134, respiratory 36, sats 68% and temperature was 98.4.  she was emergently intubated.  Latest Reference Range & Units 08/13/24 04:11  WBC 4.0 - 10.5 K/uL 9.5  Hemoglobin 12.0 - 15.0 g/dL 86.7  HCT 63.9 - 53.9 % 42.2  Platelets 150 - 400 K/uL 215  Creatinine 0.44 - 1.00 mg/dL 8.62 (H)   Blood culture sent Chest x-ray hyperinflated lung fields no focal pneumonia T-cell on 09/29/2023 was 66 with a percentage of 5.6 and and viral load was 20 which indicated she was taking her medications regularly.  She used to live in [place]  until she moved to [place]  last year but has not establish HIV care here.  I see her last refill that was given on both HIV medications was on 06/08/2024.  Last beta D glucan was less than 31 this was done on 07/25/2024.  Past Medical History:  Diagnosis Date   Allergic rhinitis    Anemia    Aortic insufficiency    Cerebral aneurysm 02/12/2023   a.) CT head 02/12/2023:  3 x 3 mm distal LEFT M1 MCA just proximal to the bifurcation   Chronic systolic heart failure (HCC)    CKD (chronic kidney disease), stage III (HCC)    COPD (chronic obstructive pulmonary disease) (HCC)    History of kidney stones    HIV  (human immunodeficiency virus infection) (HCC) 2003   a.) Dx'd in 2003; b.) currently Tx'd with darunavir -cobicistat  + rilpivirine    HLD (hyperlipidemia)    Hydronephrosis    Hypertension    LBBB (left bundle branch block)    MI (myocardial infarction) (HCC)    Moderate mitral regurgitation    Nephrolithiasis    NICM (nonischemic cardiomyopathy) (HCC)    Pneumonia due to Streptococcus    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   a.) Medtronic device placed 09/20/2017 --> CLARIA MRI CRT-D QUAD DF-4 (SN: [id number])   Right upper lobe pulmonary nodule 02/12/2023   a.) CT imaging 02/12/2023:  9 x 7 mm   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    Thrombocytopenia    TIA (transient ischemic attack) 02/12/2023   Tobacco abuse     Past Surgical History:  Procedure Laterality Date   BI-VENTRICULAR IMPLANTABLE CARDIOVERTER DEFIBRILLATOR  (CRT-D) Left 09/20/2017   Procedure: BI-VENTRICULAR IMPLANTABLE CARDIOVERTER DEFIBRILLATOR (CRT-D); Location: [facility]; Surgeon: Nancyann Ceo, MD   CHOLECYSTECTOMY     CYSTOSCOPY W/ URETERAL STENT PLACEMENT Left 06/10/2023   Procedure: CYSTOSCOPY WITH RETROGRADE PYELOGRAM/URETERAL STENT PLACEMENT;  Surgeon: Francisca Redell BROCKS, MD;  Location: [facility];  Service: Urology;  Laterality: Left;   CYSTOSCOPY/URETEROSCOPY/HOLMIUM LASER/STENT PLACEMENT Left 07/05/2023   Procedure: CYSTOSCOPY/URETEROSCOPY/HOLMIUM LASER/STENT EXCHANGE;  Surgeon: Twylla Glendia BROCKS, MD;  Location: [facility];  Service: Urology;  Laterality: Left;  Social History   Socioeconomic History   Marital status: Widowed    Spouse name: Not on file   Number of children: Not on file   Years of education: Not on file   Highest education level: Not on file  Occupational History   Not on file  Tobacco Use   Smoking status: Every Day    Current packs/day: 0.50    Types: Cigarettes   Smokeless tobacco: Not on file  Vaping Use   Vaping status: Not  on file  Substance and Sexual Activity   Alcohol use: No   Drug use: No   Sexual activity: Yes  Other Topics Concern   Not on file  Social History Narrative   Not on file   Social Drivers of Health   Tobacco Use: High Risk (07/25/2024)   Patient History    Smoking Tobacco Use: Every Day    Smokeless Tobacco Use: Unknown    Passive Exposure: Not on file  Financial Resource Strain: Not on file  Food Insecurity: No Food Insecurity (08/13/2024)   Epic    Worried About Programme Researcher, Broadcasting/film/video in the Last Year: Never true    Ran Out of Food in the Last Year: Never true  Transportation Needs: No Transportation Needs (08/13/2024)   Epic    Lack of Transportation (Medical): No    Lack of Transportation (Non-Medical): No  Physical Activity: Not on file  Stress: Not on file  Social Connections: Patient Declined (07/26/2024)   Social Connection and Isolation Panel    Frequency of Communication with Friends and Family: Patient declined    Frequency of Social Gatherings with Friends and Family: Patient declined    Attends Religious Services: Patient declined    Database Administrator or Organizations: Patient declined    Attends Banker Meetings: Patient declined    Marital Status: Patient declined  Intimate Partner Violence: Not At Risk (07/26/2024)   Epic    Fear of Current or Ex-Partner: No    Emotionally Abused: No    Physically Abused: No    Sexually Abused: No  Depression (PHQ2-9): Not on file  Alcohol Screen: Not on file  Housing: Unknown (08/13/2024)   Epic    Unable to Pay for Housing in the Last Year: Patient declined    Number of Times Moved in the Last Year: 0    Homeless in the Last Year: Patient declined  Utilities: Not At Risk (08/13/2024)   Epic    Threatened with loss of utilities: No  Health Literacy: Not on file    No family history on file. Allergies[1] I? Current Facility-Administered Medications  Medication Dose Route Frequency Provider Last Rate  Last Admin   0.9 %  sodium chloride  infusion  250 mL Intravenous Continuous Keene, Jeremiah D, NP   Held at 08/13/24 0956   [START ON 08/14/2024] ceFEPIme  (MAXIPIME ) 2 g in sodium chloride  0.9 % 100 mL IVPB  2 g Intravenous Q24H Grubb, Rodney D, RPH       Chlorhexidine  Gluconate Cloth 2 % PADS 6 each  6 each Topical Daily Kathrene Almarie Bake, NP   6 each at 08/13/24 0936   doxycycline  (VIBRAMYCIN ) 100 mg in sodium chloride  0.9 % 250 mL IVPB  100 mg Intravenous Once Fernand Rossie HERO, MD 125 mL/hr at 08/13/24 1102 Infusion Verify at 08/13/24 1102   doxycycline  (VIBRAMYCIN ) 100 mg in sodium chloride  0.9 % 250 mL IVPB  100 mg Intravenous Q12H Keene, Jeremiah D, NP  famotidine  (PEPCID ) IVPB 20 mg premix  20 mg Intravenous Q24H Haze Led, MD   Stopped at 08/13/24 0828   fentaNYL  (SUBLIMAZE ) bolus via infusion 25-100 mcg  25-100 mcg Intravenous Q1H PRN Kathrene Almarie Bake, NP   25 mcg at 08/13/24 0704   fentaNYL  in NS (10mcg/ml) infusion-PREMIX  0-400 mcg/hr Intravenous Continuous Kathrene Almarie Bake, NP 5 mL/hr at 08/13/24 1102 50 mcg/hr at 08/13/24 1102   heparin  injection 5,000 Units  5,000 Units Subcutaneous Q8H Kathrene Almarie Bake, NP   5,000 Units at 08/13/24 9362   insulin  aspart (novoLOG ) injection 0-15 Units  0-15 Units Subcutaneous Q4H Kathrene Almarie Bake, NP   3 Units at 08/13/24 0759   ipratropium-albuterol  (DUONEB) 0.5-2.5 (3) MG/3ML nebulizer solution 3 mL  3 mL Nebulization Q6H Kathrene Almarie Bake, NP   3 mL at 08/13/24 0804   ipratropium-albuterol  (DUONEB) 0.5-2.5 (3) MG/3ML nebulizer solution 3 mL  3 mL Nebulization Q6H PRN Kathrene Almarie Bake, NP       midazolam  PF (VERSED ) injection 1-2 mg  1-2 mg Intravenous Q1H PRN Kathrene Almarie Bake, NP       norepinephrine  (LEVOPHED ) 4mg  in (0.016 mg/mL) premix infusion  0-10 mcg/min Intravenous Titrated Shellia Mann D, NP 48.8 mL/hr at 08/13/24 1102 13 mcg/min at 08/13/24 1102    polyethylene glycol (MIRALAX  / GLYCOLAX ) packet 17 g  17 g Oral Daily PRN Kathrene Almarie Bake, NP       polyethylene glycol (MIRALAX  / GLYCOLAX ) packet 17 g  17 g Per Tube Daily Kathrene Almarie Bake, NP       propofol  (DIPRIVAN ) 1000 MG/100ML infusion  0-80 mcg/kg/min Intravenous Continuous Fernand Rossie HERO, MD       propofol  (DIPRIVAN ) 1000 MG/100ML infusion            senna (SENOKOT) tablet 8.6 mg  1 tablet Oral BID PRN Kathrene Almarie Bake, NP       senna (SENOKOT) tablet 8.6 mg  1 tablet Per Tube BID Kathrene Almarie Bake, NP         Abtx:  Anti-infectives (From admission, onward)    Start     Dose/Rate Route Frequency Ordered Stop   08/14/24 0600  ceFEPIme  (MAXIPIME ) 2 g in sodium chloride  0.9 % 100 mL IVPB        2 g 200 mL/hr over 30 Minutes Intravenous Every 24 hours 08/13/24 0745     08/13/24 2200  doxycycline  (VIBRAMYCIN ) 100 mg in sodium chloride  0.9 % 250 mL IVPB        100 mg 125 mL/hr over 120 Minutes Intravenous Every 12 hours 08/13/24 1025     08/13/24 0430  vancomycin  (VANCOCIN ) IVPB 1000 mg/200 mL premix        1,000 mg 200 mL/hr over 60 Minutes Intravenous  Once 08/13/24 0415 08/13/24 1002   08/13/24 0430  ceFEPIme  (MAXIPIME ) 2 g in sodium chloride  0.9 % 100 mL IVPB        2 g 200 mL/hr over 30 Minutes Intravenous  Once 08/13/24 0415 08/13/24 0627   08/13/24 0430  azithromycin (ZITHROMAX) 500 mg in sodium chloride  0.9 % 250 mL IVPB  Status:  Discontinued        500 mg 250 mL/hr over 60 Minutes Intravenous  Once 08/13/24 0415 08/13/24 0418   08/13/24 0430  doxycycline  (VIBRAMYCIN ) 100 mg in sodium chloride  0.9 % 250 mL IVPB        100 mg 125 mL/hr over 120 Minutes Intravenous  Once 08/13/24  9581         REVIEW OF SYSTEMS:  NA Objective:  VITALS:  BP (!) 85/64   Pulse 98   Temp (!) 97 F (36.1 C)   Resp 12   Wt 37 kg   SpO2 99%   BMI 12.40 kg/m   PHYSICAL EXAM:  General: intubated sedated OG tube Head: Normocephalic, without obvious  abnormality, atraumatic. Eyes: Conjunctivae clear, anicteric sclerae. Pupils are equal ENT cannot examine Neck: symmetrical, no adenopathy, thyroid: non tender no carotid bruit and no JVD. Back: No CVA tenderness. Lungs b/l air entry Heart: Regular rate and rhythm, no murmur, rub or gallop. Chest wall- AICD felt Abdomen: Soft, non-tender,not distended. Bowel sounds normal. No masses Extremities: atraumatic, no cyanosis. No edema. No clubbing Skin: No rashes or lesions. Or bruising Lymph: Cervical, supraclavicular normal. Neurologic: cannot assess Pertinent Labs Lab Results CBC    Component Value Date/Time   WBC 9.5 08/13/2024 0411   RBC 4.52 08/13/2024 0411   HGB 13.2 08/13/2024 0411   HCT 42.2 08/13/2024 0411   PLT 215 08/13/2024 0411   MCV 93.4 08/13/2024 0411   MCH 29.2 08/13/2024 0411   MCHC 31.3 08/13/2024 0411   RDW 14.5 08/13/2024 0411   LYMPHSABS 4.2 (H) 08/13/2024 0411   MONOABS 0.9 08/13/2024 0411   EOSABS 0.1 08/13/2024 0411   BASOSABS 0.1 08/13/2024 0411       Latest Ref Rng & Units 08/13/2024    4:11 AM 07/26/2024    4:11 AM 07/25/2024    6:35 PM  CMP  Glucose 70 - 99 mg/dL 751  833    BUN 6 - 20 mg/dL 21  22    Creatinine 9.55 - 1.00 mg/dL 8.62  8.63  8.91   Sodium 135 - 145 mmol/L 141  139    Potassium 3.5 - 5.1 mmol/L 4.1  4.2    Chloride 98 - 111 mmol/L 109  103    CO2 22 - 32 mmol/L 18  27    Calcium  8.9 - 10.3 mg/dL 8.5  8.3    Total Protein 6.5 - 8.1 g/dL 5.9  6.0    Total Bilirubin 0.0 - 1.2 mg/dL 0.2  0.3    Alkaline Phos 38 - 126 U/L 129  98    AST 15 - 41 U/L 36  20    ALT 0 - 44 U/L 17  11        Microbiology: Recent Results (from the past 240 hours)  Resp panel by RT-PCR (RSV, Flu A&B, Covid) Anterior Nasal Swab     Status: None   Collection Time: 08/13/24  4:11 AM   Specimen: Anterior Nasal Swab  Result Value Ref Range Status   SARS Coronavirus 2 by RT PCR NEGATIVE NEGATIVE Final    Comment: (NOTE) SARS-CoV-2 target nucleic  acids are NOT DETECTED.  The SARS-CoV-2 RNA is generally detectable in upper respiratory specimens during the acute phase of infection. The lowest concentration of SARS-CoV-2 viral copies this assay can detect is 138 copies/mL. A negative result does not preclude SARS-Cov-2 infection and should not be used as the sole basis for treatment or other patient management decisions. A negative result may occur with  improper specimen collection/handling, submission of specimen other than nasopharyngeal swab, presence of viral mutation(s) within the areas targeted by this assay, and inadequate number of viral copies(<138 copies/mL). A negative result must be combined with clinical observations, patient history, and epidemiological information. The expected result is Negative.  Fact Sheet  for Patients:  bloggercourse.com  Fact Sheet for Healthcare Providers:  seriousbroker.it  This test is no t yet approved or cleared by the [place]  FDA and  has been authorized for detection and/or diagnosis of SARS-CoV-2 by FDA under an Emergency Use Authorization (EUA). This EUA will remain  in effect (meaning this test can be used) for the duration of the COVID-19 declaration under Section 564(b)(1) of the Act, 21 U.S.C.section 360bbb-3(b)(1), unless the authorization is terminated  or revoked sooner.       Influenza A by PCR NEGATIVE NEGATIVE Final   Influenza B by PCR NEGATIVE NEGATIVE Final    Comment: (NOTE) The Xpert Xpress SARS-CoV-2/FLU/RSV plus assay is intended as an aid in the diagnosis of influenza from Nasopharyngeal swab specimens and should not be used as a sole basis for treatment. Nasal washings and aspirates are unacceptable for Xpert Xpress SARS-CoV-2/FLU/RSV testing.  Fact Sheet for Patients: bloggercourse.com  Fact Sheet for Healthcare Providers: seriousbroker.it  This  test is not yet approved or cleared by the [place]  FDA and has been authorized for detection and/or diagnosis of SARS-CoV-2 by FDA under an Emergency Use Authorization (EUA). This EUA will remain in effect (meaning this test can be used) for the duration of the COVID-19 declaration under Section 564(b)(1) of the Act, 21 U.S.C. section 360bbb-3(b)(1), unless the authorization is terminated or revoked.     Resp Syncytial Virus by PCR NEGATIVE NEGATIVE Final    Comment: (NOTE) Fact Sheet for Patients: bloggercourse.com  Fact Sheet for Healthcare Providers: seriousbroker.it  This test is not yet approved or cleared by the [place]  FDA and has been authorized for detection and/or diagnosis of SARS-CoV-2 by FDA under an Emergency Use Authorization (EUA). This EUA will remain in effect (meaning this test can be used) for the duration of the COVID-19 declaration under Section 564(b)(1) of the Act, 21 U.S.C. section 360bbb-3(b)(1), unless the authorization is terminated or revoked.  Performed at [facility], [address] [place], [place] [address]   Blood Culture (routine x 2)     Status: None (Preliminary result)   Collection Time: 08/13/24  4:11 AM   Specimen: BLOOD  Result Value Ref Range Status   Specimen Description BLOOD BLOOD LEFT FOREARM  Final   Special Requests   Final    BOTTLES DRAWN AEROBIC AND ANAEROBIC Blood Culture adequate volume   Culture   Final    NO GROWTH < 12 HOURS Performed at [facility], [address] [place], [place] [address]    Report Status PENDING  Incomplete  Blood Culture (routine x 2)     Status: None (Preliminary result)   Collection Time: 08/13/24  4:11 AM   Specimen: BLOOD  Result Value Ref Range Status   Specimen Description BLOOD LEFT ANTECUBITAL  Final   Special Requests   Final    BOTTLES DRAWN AEROBIC AND ANAEROBIC Blood Culture adequate volume    Culture   Final    NO GROWTH < 12 HOURS Performed at [facility], [address] [place], [place] [address]    Report Status PENDING  Incomplete  MRSA Next Gen by PCR, Nasal     Status: None   Collection Time: 08/13/24  6:30 AM   Specimen: Nasal Mucosa; Nasal Swab  Result Value Ref Range Status   MRSA by PCR Next Gen NOT DETECTED NOT DETECTED Final    Comment: (NOTE) The GeneXpert MRSA Assay (FDA approved for NASAL specimens only), is one component  of a comprehensive MRSA colonization surveillance program. It is not intended to diagnose MRSA infection nor to guide or monitor treatment for MRSA infections. Test performance is not FDA approved in patients less than 61 years old. Performed at [facility], [address] [place], [place] [address]     Lines and Device Date on insertion # of days DC  Central line     Foley     ETT      Patient has: []  acute illness w/systemic sxs  [mod] [x]  illness posing risk to life or function  [high]  I reviewed:  (3+) [x]  primary team note [x]  consultant note(s) []  procedure/op note(s) []  micro result(s)   []  CBC results []  chemistry results [x]  radiology report(s) []  nursing note(s)  I independently visualized:  (any)   []  cxs/plates in lab [x]  plain film images []  CT images []  PET images   []  path slide(s) []  ECG tracing []  MRI images []  nuclear scan  I discussed: (any) []  micro and/or path w/lab personnel [x]  drug options and/or interactions w/ID pharmD   []  procedure/OR findings w/other MD(s) []  echo and/or imaging w/other MD(s)   []  mgm't w/attending(s) involved in case []  setting up home abx w/OPAT team  Mgm't requires: []  prescription drug(s)  [mod] [x]  intensive toxicity monitoring  [high]   IMAGING RESULTS: CXR hyperinflated lung fileds Possible atelectasis bases No CHF or focal pneumonia Pacxemaker/AICS in palce  I have personally reviewed the films ? Impression/Recommendation  Acute hypoxic  respiratory failure secondary to COPD exacerbation Intubated On piptazo and doxy  AIDS Viral load undetectable in March 2025 and CD4 count was 2066.  Patient was taking darunavir /cobicistat  plus rilpivirine .  Her last prescription refill was on 06/08/2024 Will get HIV RNA and CD4 count Restart HAART ? Recent hospitalization for COPD exacerbation in December 2025 Also had influenza A and COVID  Nonischemic cardiomyopathy Status post pacemaker/AICD  CKD  Chronic thrombocytopenia  Past history of PJP  This consult involved complex antimicrobial management _ _ Note:  This document was prepared using Dragon voice recognition software and may include unintentional dictation errors.     [1]  Allergies Allergen Reactions   Sulfonamide Derivatives Hives and Dermatitis   Tramadol Other (See Comments)    Upset stomach and it gave her the shakes

## 2024-08-13 NOTE — Progress Notes (Signed)
 PHARMACY CONSULT NOTE - ELECTROLYTES  Pharmacy Consult for Electrolyte Monitoring and Replacement   Recent Labs: Weight: 37 kg (81 lb 9.1 oz) Estimated Creatinine Clearance: 26.1 mL/min (A) (by C-G formula based on SCr of 1.37 mg/dL (H)). Potassium (mmol/L)  Date Value  08/13/2024 4.1   Magnesium  (mg/dL)  Date Value  87/68/7974 1.9   Calcium  (mg/dL)  Date Value  98/80/7973 8.5 (L)   Albumin (g/dL)  Date Value  98/80/7973 3.3 (L)   Phosphorus (mg/dL)  Date Value  94/87/7974 2.5   Sodium (mmol/L)  Date Value  08/13/2024 141   Corrected Ca: 9.1 mg/dL  Assessment  Claire Day is a 59 y.o. female presenting with headache, chills, body aches and generalized fatigue since 07/19/2024 . PMH significant for HIV, COPD not on home oxygen, cerebral aneurysm, systolic CHF/NICM s/p AICD, ongoing persistent active tobacco use disorder, HTN, history of TIA . Pharmacy has been consulted to monitor and replace electrolytes.  Diet: NPO MIVF: LR @ 150 mL/hr Pertinent medications: N/A  Goal of Therapy: Electrolytes WNL  Plan:  No supplementation needed at this time Check BMP, Mg, Phos with AM labs  Thank you for allowing pharmacy to be a part of this patient's care.  Lum VEAR Mania, PharmD Clinical Pharmacist 08/13/2024 5:19 AM

## 2024-08-13 NOTE — Progress Notes (Signed)
 CODE SEPSIS - PHARMACY COMMUNICATION  **Broad Spectrum Antibiotics should be administered within 1 hour of Sepsis diagnosis**  Time Code Sepsis Called/Page Received: 9584  Antibiotics Ordered: cefepime , vancomycin , doxycycline    Time of 1st antibiotic administration: 0439  Additional action taken by pharmacy: -  If necessary, Name of Provider/Nurse Contacted: -    Lum VEAR Mania ,PharmD Clinical Pharmacist  08/13/2024  4:17 AM

## 2024-08-13 NOTE — Progress Notes (Signed)
 PHARMACY CONSULT NOTE - ELECTROLYTES  Pharmacy Consult for Electrolyte Monitoring and Replacement   Recent Labs: Weight: 37 kg (81 lb 9.1 oz) Estimated Creatinine Clearance: 26.1 mL/min (A) (by C-G formula based on SCr of 1.37 mg/dL (H)). Potassium (mmol/L)  Date Value  08/13/2024 4.1   Magnesium  (mg/dL)  Date Value  87/68/7974 1.9   Calcium  (mg/dL)  Date Value  98/80/7973 8.5 (L)   Albumin (g/dL)  Date Value  98/80/7973 3.3 (L)   Phosphorus (mg/dL)  Date Value  94/87/7974 2.5   Sodium (mmol/L)  Date Value  08/13/2024 141   Corrected Ca: 9.1 mg/dL  Assessment  Claire Day is a 59 y.o. female presenting with headache, chills, body aches and generalized fatigue since 07/19/2024 . PMH significant for HIV, COPD not on home oxygen, cerebral aneurysm, systolic CHF/NICM s/p AICD, ongoing persistent active tobacco use disorder, HTN, history of TIA . Pharmacy has been consulted to monitor and replace electrolytes.  Goal of Therapy: Electrolytes WNL  Plan:  No supplementation needed at this time Check BMP, Mg, Phos with AM labs  Thank you for allowing pharmacy to be a part of this patient's care.  Adriana JONETTA Bolster, PharmD Clinical Pharmacist 08/13/2024 7:46 AM
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
## 2024-08-13 NOTE — H&P (Signed)
 "   NAME:  Claire Day, MRN:  [id number], DOB:  05-Oct-1965, LOS: 0 ADMISSION DATE:  08/13/2024, CONSULTATION DATE:  08/13/24 REFERRING MD: Fernand, CHIEF COMPLAINT:  shortness of breath   HPI  59 year old female with PMH significant for HIV (low CD4), COPD (not on home O?), active tobacco use, NICM with HFrEF s/p AICD, HTN, CKD III, cerebral aneurysm, prior TIA, recent Influenza A and COVID-19 infection presenting to the ED with complaints of acute respiratory distress.  On chart review patient with recent admission (07/25/24-07/27/24) for acute respiratory failure secondary to AECOPD, Influenza A, and COVID-19, treated with steroids, antivirals, antibiotics, and BiPAP, discharged on room air. She re-presented today via EMS with severe shortness of breath. On EMS arrival, she was tripoding and speaking in short sentences and was placed on BiPAP, receiving IV magnesium  and Solu-Medrol  125 mg en route.  ED Course / Interventions: On arrival, she was agitated, repeatedly removing BiPAP. Vitals: BP 114/85 ? 160/115, HR 142 ? 151, RR 15, Temp 98.35F, SpO? 93-100% on BiPAP. ABG: pH 7.10, pCO? 59, HCO? 18, pO? 366, O? sat 100%. Due to progressive hypercapnic and hypoxemic respiratory failure, she underwent rapid sequence intubation with etomidate  7.5 mg IV and rocuronium  50 mg IV.Post-intubation, she was placed on mechanical ventilation (PRVC, FiO? 40%, Rate 15, Vt 360 mL, PEEP 5).  Labs: WBC 9.5, neut 4.2, Hgb 13.2, Hct 42.2, Plt 215. BMP: Na 141, K 4.1, Cl 109, CO? 18, Glu 248, BUN 21, Cr 1.37 (eGFR 26), Ca 8.5. LFTs: AST 36, ALT 17, AlkPhos 129, TP 5.9, Alb 3.3, T Bili 0.2. INR 1.1. ABG (BiPAP/HiFiO?): pH 7.10, pCO? 59, HCO? 18, pO? 366, O?Sat 100%.  Imaging: CXR 08/13/24: Hyperexpanded lungs, R base patchy airspace disease (atelectasis vs infiltrate), L CP angle blunting stable, cardiac silhouette upper normal, L pacemaker/AICD in place.  Patient treated DuoNeb, LR 500 mL bolus, antibiotics (cefepime  2 g  IV, vanco 1 g IV, doxy 100  mg IV). Propofol  infusion for sedation. PCCM consulted for Admission to ICU.  Past Medical History  HIV (low CD4) COPD NICM/HFrEF s/p AICD Hypertension CKD stage III Cerebral aneurysm Prior TIA Myocardial infarction LBBB Hyperlipidemia Allergic rhinitis Anemia Moderate mitral regurgitation Pneumonia (Streptococcus) Nephrolithiasis/hydronephrosis Squamous cell carcinoma in situ (cervix) Thrombocytopenia Tobacco use  Significant Hospital Events   1/19:Admit to ICU with Acute hypercapnic/hypoxemic RF due to AECOPD requiring mechanical ventilation.  Consults:  ID  Procedures:  1/19: Endotrachael Intubation  Interim History / Subjective:    -see significant events  Micro Data:  08/13/24 SARS-CoV-2 PCR ? 08/13/24 Flu PCR ? 08/13/24 RVP ? 08/13/24 BCx2 ? 08/13/24 UCx ? 08/13/24 MRSA PCR ? 08/13/24 Strep pneumo Ur Ag ? 08/13/24 Legionella Ur Ag ?   Antimicrobials:   Anti-infectives (From admission, onward)    Start     Dose/Rate Route Frequency Ordered Stop   08/13/24 0430  vancomycin  (VANCOCIN ) IVPB 1000 mg/200 mL premix        1,000 mg 200 mL/hr over 60 Minutes Intravenous  Once 08/13/24 0415     08/13/24 0430  ceFEPIme  (MAXIPIME ) 2 g in sodium chloride  0.9 % 100 mL IVPB        2 g 200 mL/hr over 30 Minutes Intravenous  Once 08/13/24 0415 08/13/24 0627   08/13/24 0430  azithromycin (ZITHROMAX) 500 mg in sodium chloride  0.9 % 250 mL IVPB  Status:  Discontinued        500 mg 250 mL/hr over 60 Minutes Intravenous  Once 08/13/24 0415 08/13/24 0418  08/13/24 0430  doxycycline  (VIBRAMYCIN ) 100 mg in sodium chloride  0.9 % 250 mL IVPB        100 mg 125 mL/hr over 120 Minutes Intravenous  Once 08/13/24 0418         OBJECTIVE  Blood pressure (!) 160/115, pulse (!) 151, temperature 98.4 F (36.9 C), resp. rate 15, weight 37 kg, SpO2 93%.    Vent Mode: PRVC FiO2 (%):  [40 %-80 %] 40 % Set Rate:  [15 bmp] 15 bmp Vt Set:  [360 mL] 360  mL PEEP:  [5 cmH20] 5 cmH20  No intake or output data in the 24 hours ending 08/13/24 0513 Filed Weights   08/13/24 0443  Weight: 37 kg   Physical Examination  GEN: Critically ill patient, intubated and sedated HEENT: [place]/AT. PERRL, sclerae anicteric. HEART: regular rhythm, normal rate, S1, S2, no M/R/G,  LUNGS: CTAB, diminished bilateral without wheezes, no increased WOB,  EXTREMITIES: No Edema, cap refill  NEURO: No gross focal deficits. PSYCH:  UTA ABDOMINAL: Soft: BS x 4, NTND SKIN: Intact, warm, no rashes lesion, or ulcer  Active Hospital Problem list   See systems below  Assessment & Plan:  #Acute Hypoxic Hypercapnic Respiratory Failure iso  #AECOPD #CAP Hx of COPD; smoker  -PRVC vent:; titrate per ABG -IV methylprednisolone  -Neb ipratropium-albuterol  q4h + PRN q6h -propofol  + fentanyl , RASS -1 -Daily SAT/SBT when appropriate -Trend Xray & ABGs, target permissive hypercapnia -Nicotine  replacement therapy  #Suspected Pneumonia / Infection CXR: hyperinflation with subtle RLL patchy airspace disease Initial interventions/workup included: 2 L of NS/LR & cefepime , vancomycin , doxycycline  -F/u cultures, trend lactic/ PCT -Monitor WBC/ fever curve -Obtain MRSA nasal swab, RVP, legionella, strep urine Ag -Cont IV antibiotics De-escalate antibiotics as data returns -Gentle IVF hydration as needed  #AKI on CKD stage III: AKI likely iso above Cr 1.37 (baseline CKD) -Follow BMP+Mag -Ensure adequate renal perfusion -Avoid nephrotoxic  -Replace lytes -strict I&O  #NICM/HFrEF s/p AICD  #s/p AICDHTNHLD Last Echo 01/2023 recovered EF >65%,  -Euvolemic on exam.  -BNP: 5944 -Chest XR (1/18): No pulmonary edema -Hold Home GDMT  -Strict I/Os -Obtain 2Decho -Hold Diuretics -Low threshold for cards consult  #Hyperglycemia Glucose 248 Likely stress and steroid related no hx of DM -Check A1c -CBG's q4hrs  -SSI -Target CBG readings 140 to 180 -Follow  hypo/hyperglycemic protocol   #HIV/AIDS -Check Viral load , CD4 count -Per Last ID notes on prezcobix  and rilpavirine   -Consult ID for HIV hx -Plan to restart HARRT once able to tolerate p.o.  Best practice:  Diet:  NPO Pain/Anxiety/Delirium protocol (if indicated): Yes (RASS goal -1) VAP protocol (if indicated): Yes DVT prophylaxis: Subcutaneous Heparin  GI prophylaxis: H2B Glucose control:  SSI Yes Central venous access:  N/A Arterial line:  N/A Foley:  Yes, and it is still needed Mobility:  bed rest  PT/OT consulted: N/A Code Status:  full code Disposition: ICU   = Goals of Care =  Primary Emergency Contact: Clay,Tamicka, Home Phone: [phone]   Labs/imaging that I havepersonally reviewed  (right click and Reselect all SmartList Selections daily)   DG Abdomen 1 View Result Date: 08/13/2024 CLINICAL DATA:  OG tube placement. EXAM: ABDOMEN - 1 VIEW COMPARISON:  06/26/2024 FINDINGS: OG tube tip is in the proximal stomach with side port position in the region of the GE junction. IMPRESSION: OG tube tip is in the proximal stomach with side port position in the region of the GE junction. Tube could be advanced approximately 5 cm to ensure  side port positioning below the GE junction. Repeat x-ray recommended after repositioning. Electronically Signed   By: Camellia Candle M.D.   On: 08/13/2024 05:10   DG Chest Portable 1 View Result Date: 08/13/2024 CLINICAL DATA:  Ventilator dependence.  Status post intubation. EXAM: PORTABLE CHEST 1 VIEW COMPARISON:  08/13/2024, earlier same day FINDINGS: Scratchendotracheal tube tip is approximately 4.3 cm above the base of the carina. The NG tube passes into the stomach although the distal tip position is not included on the film. Cardiopericardial silhouette is at upper limits of normal for size. Lungs are hyperexpanded patchy airspace disease seen in the right base on the previous study is less prominent on the current exam. Similar blunting of  the left costophrenic angle, chronic. Telemetry leads overlie the chest. IMPRESSION: 1. Endotracheal tube tip is approximately 4.3 cm above the base of the carina. 2. Interval decrease in patchy airspace disease at the right base. Electronically Signed   By: Camellia Candle M.D.   On: 08/13/2024 05:10   DG Chest Portable 1 View Result Date: 08/13/2024 CLINICAL DATA:  Shortness of breath. EXAM: PORTABLE CHEST 1 VIEW COMPARISON:  07/25/2024 FINDINGS: Lungs are hyperexpanded. Cardiopericardial silhouette is at upper limits of normal for size. Stable blunting of the left costophrenic angle. Subtle patchy airspace disease noted at the right lung base. No pulmonary edema or focal lung consolidation. Left-sided permanent pacemaker/AICD again noted. Telemetry leads overlie the chest. IMPRESSION: Subtle patchy airspace disease at the right lung base. Imaging features could be related to atelectasis or developing infiltrate. Electronically Signed   By: Camellia Candle M.D.   On: 08/13/2024 05:06     Labs   CBC: Recent Labs  Lab 08/13/24 0411  WBC 9.5  NEUTROABS 4.2  HGB 13.2  HCT 42.2  MCV 93.4  PLT 215    Basic Metabolic Panel: Recent Labs  Lab 08/13/24 0411  NA 141  K 4.1  CL 109  CO2 18*  GLUCOSE 248*  BUN 21*  CREATININE 1.37*  CALCIUM  8.5*   GFR: Estimated Creatinine Clearance: 26.1 mL/min (A) (by C-G formula based on SCr of 1.37 mg/dL (H)). Recent Labs  Lab 08/13/24 0411  WBC 9.5    Liver Function Tests: Recent Labs  Lab 08/13/24 0411  AST 36  ALT 17  ALKPHOS 129*  BILITOT 0.2  PROT 5.9*  ALBUMIN 3.3*   No results for input(s): LIPASE, AMYLASE in the last 168 hours. No results for input(s): AMMONIA in the last 168 hours.  ABG    Component Value Date/Time   PHART 7.1 (LL) 08/13/2024 0422   PCO2ART 59 (H) 08/13/2024 0422   PO2ART 366 (H) 08/13/2024 0422   HCO3 18.3 (L) 08/13/2024 0422   ACIDBASEDEF 11.9 (H) 08/13/2024 0422   O2SAT 100 08/13/2024 0422      Coagulation Profile: Recent Labs  Lab 08/13/24 0411  INR 1.1    Cardiac Enzymes: No results for input(s): CKTOTAL, CKMB, CKMBINDEX, TROPONINI in the last 168 hours.  HbA1C: No results found for: HGBA1C  CBG: No results for input(s): GLUCAP in the last 168 hours.  Review of Systems:   Unable to be obtained secondary to the patient's intubated and sedated status.   Past Medical History  She,  has a past medical history of Allergic rhinitis, Anemia, Aortic insufficiency, Cerebral aneurysm (02/12/2023), Chronic systolic heart failure (HCC), CKD (chronic kidney disease), stage III (HCC), COPD (chronic obstructive pulmonary disease) (HCC), History of kidney stones, HIV (human immunodeficiency virus infection) (HCC) (2003),  HLD (hyperlipidemia), Hydronephrosis, Hypertension, LBBB (left bundle branch block), MI (myocardial infarction) (HCC), Moderate mitral regurgitation, Nephrolithiasis, NICM (nonischemic cardiomyopathy) (HCC), Pneumonia due to Streptococcus, 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   Surgical History    Past Surgical History:  Procedure Laterality Date   BI-VENTRICULAR IMPLANTABLE CARDIOVERTER DEFIBRILLATOR  (CRT-D) Left 09/20/2017   Procedure: BI-VENTRICULAR IMPLANTABLE CARDIOVERTER DEFIBRILLATOR (CRT-D); Location: [facility]; Surgeon: Nancyann Ceo, MD   CHOLECYSTECTOMY     CYSTOSCOPY W/ URETERAL STENT PLACEMENT Left 06/10/2023   Procedure: CYSTOSCOPY WITH RETROGRADE PYELOGRAM/URETERAL STENT PLACEMENT;  Surgeon: Francisca Redell BROCKS, MD;  Location: [facility];  Service: Urology;  Laterality: Left;   CYSTOSCOPY/URETEROSCOPY/HOLMIUM LASER/STENT PLACEMENT Left 07/05/2023   Procedure: CYSTOSCOPY/URETEROSCOPY/HOLMIUM LASER/STENT EXCHANGE;  Surgeon:  Twylla Glendia BROCKS, MD;  Location: [facility];  Service: Urology;  Laterality: Left;     Social History   reports that she has been smoking cigarettes. She does not have any smokeless tobacco history on file. She reports that she does not drink alcohol and does not use drugs.   Family History   Her family history is not on file.   Allergies Allergies[1]   Home Medications  Prior to Admission medications  Medication Sig Start Date End Date Taking? Authorizing Provider  albuterol  (VENTOLIN  HFA) 108 (90 Base) MCG/ACT inhaler Inhale 2 puffs into the lungs every 4 (four) hours as needed for wheezing or shortness of breath. 11/14/21   Cleotilde Redell, MD  dapsone  100 MG tablet Take 100 mg by mouth at bedtime.    [provider]  darunavir -cobicistat  (PREZCOBIX ) 800-150 MG tablet Take 1 tablet by mouth at bedtime. Swallow whole. Do NOT crush, break or chew tablets. Take with food.    [provider]  ipratropium-albuterol  (DUONEB) 0.5-2.5 (3) MG/3ML SOLN Take 3 mLs by nebulization every 6 (six) hours as needed (Shortness of breath and wheezing). 12/05/23   Amin, Sumayya, MD  mirtazapine  (REMERON ) 15 MG tablet Take 15 mg by mouth at bedtime. 02/19/24   [provider]  rilpivirine  (EDURANT ) 25 MG TABS tablet Take 25 mg by mouth at bedtime.    [provider]  Tiotropium Bromide-Olodaterol (STIOLTO RESPIMAT) 2.5-2.5 MCG/ACT AERS Inhale 2 puffs into the lungs daily as needed (wheezing, shortness of breath).    [provider]   Scheduled Meds:  Chlorhexidine  Gluconate Cloth  6 each Topical Daily   heparin   5,000 Units Subcutaneous Q8H   insulin  aspart  0-15 Units Subcutaneous Q4H   ipratropium-albuterol   3 mL Nebulization Q6H   Continuous Infusions:  doxycycline  (VIBRAMYCIN ) IV     lactated ringers  500 mL (08/13/24 0429)   lactated ringers      propofol  (DIPRIVAN ) infusion     propofol      vancomycin      PRN Meds:.ipratropium-albuterol , polyethylene  glycol, propofol , senna     Critical care time: 55 minutes        Almarie Nose DNP, CCRN, FNP-C, AGACNP-BC Acute Care & Family Nurse Practitioner [facility] PCCM on call pager [phone]         [1]  Allergies Allergen Reactions   Sulfonamide Derivatives Hives and Dermatitis   Tramadol Other (See Comments)    Upset stomach and it gave her the shakes    "

## 2024-08-13 NOTE — TOC Progression Note (Signed)
 Transition of Care [facility]) - Progression Note    Patient Details  Name: Claire Day MRN: [id number] Date of Birth: March 20, 1966  Transition [facility]) CM/SW Contact  K'La JINNY Ruts, LCSW Phone Number: 08/13/2024, 11:43 AM  Clinical Narrative:    Chart reviewed. Patient is intubated. SW called patient daughter. SW left HIPAA compliant VM for patient daughter to call back.                     Expected Discharge Plan and Services                                               Social Drivers of Health (SDOH) Interventions SDOH Screenings   Food Insecurity: No Food Insecurity (08/13/2024)  Housing: Unknown (08/13/2024)  Transportation Needs: No Transportation Needs (08/13/2024)  Utilities: Not At Risk (08/13/2024)  Social Connections: Patient Declined (07/26/2024)  Tobacco Use: High Risk (07/25/2024)    Readmission Risk Interventions    12/02/2023    9:43 PM  Readmission Risk Prevention Plan  Post Dischage Appt Complete  Medication Screening Complete  Transportation Screening Complete

## 2024-08-13 NOTE — Discharge Instructions (Signed)
 [facility] [employer] Social [employer] 2.0Department of Social ServicesOpen Services: SNAP/Food Stamps, Medicaid/Health Coverage, [employer] (TANF), childcare subsidy, emergency assistance referrals, utility help, housing-related services through programs. dss.https://neal-mccoy.info/ ?? Address: [address], [place], [place] [address] ?? Phone: [phone] ?? Hours: Monday-Friday, 8:00 am-5:00 pm [email or web address]://www.hines.net/ ? Low Income Energy Assistance Info: [phone] [email or web address]://www.hines.net/ ? Apply for economic benefits by phone or in person at DSS. [email or web address]://www.hines.net/

## 2024-08-13 NOTE — Progress Notes (Signed)
*  PRELIMINARY RESULTS* Echocardiogram 2D Echocardiogram has been performed.  Claire Day 08/13/2024, 3:40 PM

## 2024-08-13 NOTE — ED Provider Notes (Signed)
 "  [facility] Provider Note    Event Date/Time   First MD Initiated Contact with Patient 08/13/24 [phone]     (approximate)   History   Shortness of Breath   HPI  Claire Day is a 59 y.o. female history of HIV COPD and defibrillator although I have not been able to certain why she has the defibrillator.  She presents with concern of respiratory failure she was just discharged about 2 weeks ago for a COPD exacerbation I reviewed her note from that time.  It seems EMS was contacted and she was found to be tripoding and speaking very short sentences, she was placed on BiPAP given magnesium  and 125 mg of Solu-Medrol  en route.  Upon arrival here, speaking in short sentences, repeatedly removing the BiPAP and demanding water.  Unfortunately not able to provide me with much history at this time.     Physical Exam   Triage Vital Signs: ED Triage Vitals [08/13/24 0405]  Encounter Vitals Group     BP      Girls Systolic BP Percentile      Girls Diastolic BP Percentile      Boys Systolic BP Percentile      Boys Diastolic BP Percentile      Pulse      Resp      Temp      Temp src      SpO2      Weight      Height      Head Circumference      Peak Flow      Pain Score 0     Pain Loc      Pain Education      Exclude from Growth Chart     Most recent vital signs: Vitals:   08/13/24 0430 08/13/24 0500  BP: 114/85 (!) 160/115  Pulse: (!) 142 (!) 151  Resp:  15  Temp:  98.4 F (36.9 C)  SpO2: 100% 93%     General: Awake, appears very uncomfortable currently on BiPAP CV:  Tachycardic extremities are cold, no extremity swelling Resp:  Increased work of breathing, tachypneic, clear accessory muscle usage Abd:  No distention.  Soft nontender Other:     ED Results / Procedures / Treatments   Labs (all labs ordered are listed, but only abnormal results are displayed) Labs Reviewed  COMPREHENSIVE METABOLIC PANEL WITH GFR - Abnormal; Notable for the  following components:      Result Value   CO2 18 (*)    Glucose, Bld 248 (*)    BUN 21 (*)    Creatinine, Ser 1.37 (*)    Calcium  8.5 (*)    Total Protein 5.9 (*)    Albumin 3.3 (*)    Alkaline Phosphatase 129 (*)    GFR, Estimated 45 (*)    All other components within normal limits  CBC WITH DIFFERENTIAL/PLATELET - Abnormal; Notable for the following components:   Lymphs Abs 4.2 (*)    All other components within normal limits  URINALYSIS, W/ REFLEX TO CULTURE (INFECTION SUSPECTED) - Abnormal; Notable for the following components:   Color, Urine YELLOW (*)    APPearance CLOUDY (*)    Hgb urine dipstick LARGE (*)    Protein, ur 100 (*)    Leukocytes,Ua SMALL (*)    Bacteria, UA MANY (*)    All other components within normal limits  BLOOD GAS, ARTERIAL - Abnormal; Notable for the following components:  pH, Arterial 7.1 (*)    pCO2 arterial 59 (*)    pO2, Arterial 366 (*)    Bicarbonate 18.3 (*)    Acid-base deficit 11.9 (*)    All other components within normal limits  RESP PANEL BY RT-PCR (RSV, FLU A&B, COVID)  RVPGX2  CULTURE, BLOOD (ROUTINE X 2)  CULTURE, BLOOD (ROUTINE X 2)  URINE CULTURE  PROTIME-INR  LACTIC ACID, PLASMA  LACTIC ACID, PLASMA     EKG  Appears to be a irregularly irregular rhythm with rate of about 140, axis of 90, left bundle branch block is present, QTc does appear to be prolonged, no obvious ischemia appreciated on this EKG although difficult to interpret with setting of significant tachycardia and prior left bundle branch block   RADIOLOGY On my independent interpretation of the chest x-ray, noted emphysematous changes but no acute infiltrate or other acute cardiopulmonary finding.  The repeat chest x-ray was performed demonstrating endotracheal tube in appropriate location  PROCEDURES:  Critical Care performed: Yes, see critical care procedure note(s)  CRITICAL CARE Performed by: Rossie CHRISTELLA Bathe   Total critical care time: 55  minutes  Critical care time was exclusive of separately billable procedures and treating other patients.  Critical care was necessary to treat or prevent imminent or life-threatening deterioration.  Critical care was time spent personally by me on the following activities: development of treatment plan with patient and/or surrogate as well as nursing, discussions with consultants, evaluation of patient's response to treatment, examination of patient, obtaining history from patient or surrogate, ordering and performing treatments and interventions, ordering and review of laboratory studies, ordering and review of radiographic studies, pulse oximetry and re-evaluation of patient's condition.   Procedure Name: Intubation Date/Time: 08/13/2024 5:06 AM  Performed by: Bathe Rossie CHRISTELLA, MDPre-anesthesia Checklist: Patient identified Oxygen Delivery Method: Ambu bag Preoxygenation: Pre-oxygenation with 100% oxygen Induction Type: IV induction and Rapid sequence Ventilation: Mask ventilation without difficulty Laryngoscope Size: Glidescope Grade View: Grade I Tube size: 7.0 mm Number of attempts: 2 Airway Equipment and Method: Stylet and Video-laryngoscopy Placement Confirmation: ETT inserted through vocal cords under direct vision, Positive ETCO2, CO2 detector and Breath sounds checked- equal and bilateral Secured at: 25 cm Tube secured with: ETT holder       MEDICATIONS ORDERED IN ED: Medications  lactated ringers  infusion (has no administration in time range)  vancomycin  (VANCOCIN ) IVPB 1000 mg/200 mL premix (has no administration in time range)  ceFEPIme  (MAXIPIME ) 2 g in sodium chloride  0.9 % 100 mL IVPB (2 g Intravenous New Bag/Given 08/13/24 0439)  lactated ringers  bolus 500 mL (500 mLs Intravenous New Bag/Given 08/13/24 0429)  doxycycline  (VIBRAMYCIN ) 100 mg in sodium chloride  0.9 % 250 mL IVPB (has no administration in time range)  propofol  (DIPRIVAN ) 1000 MG/100ML infusion (has no  administration in time range)  propofol  (DIPRIVAN ) 1000 MG/100ML infusion (has no administration in time range)  ipratropium-albuterol  (DUONEB) 0.5-2.5 (3) MG/3ML nebulizer solution (  Given 08/13/24 0416)  ipratropium-albuterol  (DUONEB) 0.5-2.5 (3) MG/3ML nebulizer solution (  Given 08/13/24 0416)  rocuronium  (ZEMURON ) 100 MG/10ML injection (50 mg  Given 08/13/24 0434)  etomidate  (AMIDATE ) 2 MG/ML injection (7.5 mg  Given 08/13/24 0433)     IMPRESSION / MDM / ASSESSMENT AND PLAN / ED COURSE  I reviewed the triage vital signs and the nursing notes.  Patient's presentation is most consistent with acute presentation with potential threat to life or bodily function.  59 year old female presenting today with concern of acute respiratory failure.  Placed on BiPAP by EMS, unfortunately patient not tolerating BiPAP very well, we attempted BiPAP for about 10 minutes in the emergency department while obtaining IV access and ABG.  Briefly after patient continues ripping off BiPAP and attempted get up out of bed she does appear to be slightly confused on exam.  At this time we will have her intubated due to failed BiPAP and plan for admission to ICU.   Clinical Course as of 08/13/24 0509  Mon Aug 13, 2024  0501 I spoke with Almarie from the ICU who will come to assess the patient for admission at this time. [SK]    Clinical Course User Index [SK] Fernand Rossie HERO, MD     FINAL CLINICAL IMPRESSION(S) / ED DIAGNOSES   Final diagnoses:  Acute respiratory failure with hypoxia (HCC)  COPD exacerbation (HCC)     Rx / DC Orders   ED Discharge Orders     None        Note:  This document was prepared using Dragon voice recognition software and may include unintentional dictation errors.   Fernand Rossie HERO, MD 08/13/24 0630  "

## 2024-08-13 NOTE — Progress Notes (Signed)
 Updated pt's daughter Shirly Borrow via telephone on plan of care.   Also discussed need for central line due to escalating vasopressor requirements above peripheral guardrails.  She gives consent for central line placement.    Inge Lecher, AGACNP-BC [facility] Prefer epic messenger for cross cover needs If after hours, please call E-link

## 2024-08-14 DIAGNOSIS — F17210 Nicotine dependence, cigarettes, uncomplicated: Secondary | ICD-10-CM | POA: Diagnosis not present

## 2024-08-14 DIAGNOSIS — I428 Other cardiomyopathies: Secondary | ICD-10-CM

## 2024-08-14 DIAGNOSIS — J9602 Acute respiratory failure with hypercapnia: Secondary | ICD-10-CM | POA: Diagnosis not present

## 2024-08-14 DIAGNOSIS — J9601 Acute respiratory failure with hypoxia: Secondary | ICD-10-CM | POA: Diagnosis not present

## 2024-08-14 DIAGNOSIS — Z95810 Presence of automatic (implantable) cardiac defibrillator: Secondary | ICD-10-CM

## 2024-08-14 DIAGNOSIS — J441 Chronic obstructive pulmonary disease with (acute) exacerbation: Secondary | ICD-10-CM | POA: Diagnosis not present

## 2024-08-14 DIAGNOSIS — N186 End stage renal disease: Secondary | ICD-10-CM | POA: Diagnosis not present

## 2024-08-14 DIAGNOSIS — B20 Human immunodeficiency virus [HIV] disease: Secondary | ICD-10-CM | POA: Diagnosis not present

## 2024-08-14 DIAGNOSIS — N179 Acute kidney failure, unspecified: Secondary | ICD-10-CM | POA: Diagnosis not present

## 2024-08-14 DIAGNOSIS — D696 Thrombocytopenia, unspecified: Secondary | ICD-10-CM | POA: Diagnosis not present

## 2024-08-14 LAB — CBC
HCT: 38.2 % (ref 36.0–46.0)
Hemoglobin: 12.2 g/dL (ref 12.0–15.0)
MCH: 28.9 pg (ref 26.0–34.0)
MCHC: 31.9 g/dL (ref 30.0–36.0)
MCV: 90.5 fL (ref 80.0–100.0)
Platelets: 187 K/uL (ref 150–400)
RBC: 4.22 MIL/uL (ref 3.87–5.11)
RDW: 14.5 % (ref 11.5–15.5)
WBC: 11.5 K/uL — ABNORMAL HIGH (ref 4.0–10.5)
nRBC: 0.2 % (ref 0.0–0.2)

## 2024-08-14 LAB — TROPONIN T, HIGH SENSITIVITY: Troponin T High Sensitivity: 132 ng/L (ref 0–19)

## 2024-08-14 LAB — BASIC METABOLIC PANEL WITH GFR
Anion gap: 11 (ref 5–15)
BUN: 37 mg/dL — ABNORMAL HIGH (ref 6–20)
CO2: 20 mmol/L — ABNORMAL LOW (ref 22–32)
Calcium: 7.9 mg/dL — ABNORMAL LOW (ref 8.9–10.3)
Chloride: 108 mmol/L (ref 98–111)
Creatinine, Ser: 2.76 mg/dL — ABNORMAL HIGH (ref 0.44–1.00)
GFR, Estimated: 19 mL/min — ABNORMAL LOW
Glucose, Bld: 101 mg/dL — ABNORMAL HIGH (ref 70–99)
Potassium: 4.9 mmol/L (ref 3.5–5.1)
Sodium: 139 mmol/L (ref 135–145)

## 2024-08-14 LAB — GLUCOSE, CAPILLARY
Glucose-Capillary: 94 mg/dL (ref 70–99)
Glucose-Capillary: 85 mg/dL (ref 70–99)
Glucose-Capillary: 94 mg/dL (ref 70–99)
Glucose-Capillary: 69 mg/dL — ABNORMAL LOW (ref 70–99)
Glucose-Capillary: 85 mg/dL (ref 70–99)
Glucose-Capillary: 91 mg/dL (ref 70–99)

## 2024-08-14 LAB — MAGNESIUM: Magnesium: 2.4 mg/dL (ref 1.7–2.4)

## 2024-08-14 LAB — PHOSPHORUS: Phosphorus: 5.5 mg/dL — ABNORMAL HIGH (ref 2.5–4.6)

## 2024-08-14 MED ORDER — ENSURE PLUS HIGH PROTEIN PO LIQD
237.0000 mL | Freq: Two times a day (BID) | ORAL | Status: DC
  Administered 2024-08-14 – 2024-08-21 (×14): 237 mL via ORAL

## 2024-08-14 MED ORDER — PHENOL 1.4 % MT LIQD
1.0000 | OROMUCOSAL | Status: DC | PRN
  Administered 2024-08-14: 1 via OROMUCOSAL
  Filled 2024-08-14: qty 177

## 2024-08-14 MED ORDER — THIAMINE HCL 100 MG PO TABS
100.0000 mg | ORAL_TABLET | Freq: Every day | ORAL | Status: AC
  Administered 2024-08-15 – 2024-08-21 (×7): 100 mg via ORAL
  Filled 2024-08-14 (×14): qty 1

## 2024-08-14 MED ORDER — IPRATROPIUM-ALBUTEROL 0.5-2.5 (3) MG/3ML IN SOLN
3.0000 mL | Freq: Three times a day (TID) | RESPIRATORY_TRACT | Status: DC
  Administered 2024-08-14 – 2024-08-16 (×6): 3 mL via RESPIRATORY_TRACT
  Filled 2024-08-14 (×7): qty 3

## 2024-08-14 MED ORDER — LACTATED RINGERS IV SOLN: INTRAVENOUS | Status: DC

## 2024-08-14 MED ORDER — ONDANSETRON HCL 4 MG/2ML IJ SOLN
4.0000 mg | Freq: Four times a day (QID) | INTRAMUSCULAR | Status: DC | PRN
  Administered 2024-08-14: 4 mg via INTRAVENOUS
  Filled 2024-08-14: qty 2

## 2024-08-14 MED ORDER — ADULT MULTIVITAMIN W/MINERALS CH
1.0000 | ORAL_TABLET | Freq: Every day | ORAL | Status: DC
  Administered 2024-08-15 – 2024-08-20 (×5): 1 via ORAL
  Filled 2024-08-14 (×6): qty 1

## 2024-08-14 MED ORDER — DEXMEDETOMIDINE HCL IN NACL 400 MCG/100ML IV SOLN
0.0000 ug/kg/h | INTRAVENOUS | Status: DC
  Administered 2024-08-14: 0.4 ug/kg/h via INTRAVENOUS
  Filled 2024-08-14: qty 100

## 2024-08-14 MED ORDER — PIPERACILLIN-TAZOBACTAM IN DEX 2-0.25 GM/50ML IV SOLN
2.2500 g | Freq: Three times a day (TID) | INTRAVENOUS | Status: DC
  Administered 2024-08-14 – 2024-08-15 (×4): 2.25 g via INTRAVENOUS
  Filled 2024-08-14 (×5): qty 50

## 2024-08-14 MED ORDER — B COMPLEX-C PO TABS
1.0000 | ORAL_TABLET | Freq: Every day | ORAL | Status: DC
  Administered 2024-08-15 – 2024-08-20 (×5): 1 via ORAL
  Filled 2024-08-14 (×8): qty 1

## 2024-08-14 MED ORDER — RILPIVIRINE HCL 25 MG PO TABS
25.0000 mg | ORAL_TABLET | Freq: Every day | ORAL | Status: DC
  Administered 2024-08-14 – 2024-08-20 (×7): 25 mg via ORAL
  Filled 2024-08-14 (×8): qty 1

## 2024-08-14 MED ORDER — DARUNAVIR-COBICISTAT 800-150 MG PO TABS
1.0000 | ORAL_TABLET | Freq: Every day | ORAL | Status: DC
  Administered 2024-08-14 – 2024-08-20 (×7): 1 via ORAL
  Filled 2024-08-14 (×8): qty 1

## 2024-08-14 NOTE — Progress Notes (Signed)
 "   NAME:  Claire Day, MRN:  [id number], DOB:  Feb 04, 1966, LOS: 1 ADMISSION DATE:  08/13/2024, CONSULTATION DATE:  08/13/24 REFERRING MD: Fernand, CHIEF COMPLAINT:  shortness of breath   HPI  59 year old female with PMH significant for HIV (low CD4), COPD (not on home O?), active tobacco use, NICM with HFrEF s/p AICD, HTN, CKD III, cerebral aneurysm, prior TIA, recent Influenza A and COVID-19 infection presenting to the ED with complaints of acute respiratory distress.  On chart review patient with recent admission (07/25/24-07/27/24) for acute respiratory failure secondary to AECOPD, Influenza A, and COVID-19, treated with steroids, antivirals, antibiotics, and BiPAP, discharged on room air. She re-presented today via EMS with severe shortness of breath. On EMS arrival, she was tripoding and speaking in short sentences and was placed on BiPAP, receiving IV magnesium  and Solu-Medrol  125 mg en route.  ED Course / Interventions: On arrival, she was agitated, repeatedly removing BiPAP. Vitals: BP 114/85 ? 160/115, HR 142 ? 151, RR 15, Temp 98.86F, SpO? 93-100% on BiPAP. ABG: pH 7.10, pCO? 59, HCO? 18, pO? 366, O? sat 100%. Due to progressive hypercapnic and hypoxemic respiratory failure, she underwent rapid sequence intubation with etomidate  7.5 mg IV and rocuronium  50 mg IV.Post-intubation, she was placed on mechanical ventilation (PRVC, FiO? 40%, Rate 15, Vt 360 mL, PEEP 5).  Labs: WBC 9.5, neut 4.2, Hgb 13.2, Hct 42.2, Plt 215. BMP: Na 141, K 4.1, Cl 109, CO? 18, Glu 248, BUN 21, Cr 1.37 (eGFR 26), Ca 8.5. LFTs: AST 36, ALT 17, AlkPhos 129, TP 5.9, Alb 3.3, T Bili 0.2. INR 1.1. ABG (BiPAP/HiFiO?): pH 7.10, pCO? 59, HCO? 18, pO? 366, O?Sat 100%.  Imaging: CXR 08/13/24: Hyperexpanded lungs, R base patchy airspace disease (atelectasis vs infiltrate), L CP angle blunting stable, cardiac silhouette upper normal, L pacemaker/AICD in place.  Patient treated DuoNeb, LR 500 mL bolus, antibiotics (cefepime  2 g  IV, vanco 1 g IV, doxy 100  mg IV). Propofol  infusion for sedation. PCCM consulted for Admission to ICU.  08/14/24- on levo 10/vaso, doing SBT today.  On zosyn /doxy with positive tracheal aspirate cultures.  S/p Extubation.   Past Medical History  HIV (low CD4) COPD NICM/HFrEF s/p AICD Hypertension CKD stage III Cerebral aneurysm Prior TIA Myocardial infarction LBBB Hyperlipidemia Allergic rhinitis Anemia Moderate mitral regurgitation Pneumonia (Streptococcus) Nephrolithiasis/hydronephrosis Squamous cell carcinoma in situ (cervix) Thrombocytopenia Tobacco use  Significant Hospital Events   1/19:Admit to ICU with Acute hypercapnic/hypoxemic RF due to AECOPD requiring mechanical ventilation.  Consults:  ID  Procedures:  1/19: Endotrachael Intubation  Interim History / Subjective:    -see significant events  Micro Data:  08/13/24 SARS-CoV-2 PCR ? 08/13/24 Flu PCR ? 08/13/24 RVP ? 08/13/24 BCx2 ? 08/13/24 UCx ? 08/13/24 MRSA PCR ? 08/13/24 Strep pneumo Ur Ag ? 08/13/24 Legionella Ur Ag ?   Antimicrobials:   Anti-infectives (From admission, onward)    Start     Dose/Rate Route Frequency Ordered Stop   08/14/24 1400  piperacillin -tazobactam (ZOSYN ) IVPB 2.25 g        2.25 g 100 mL/hr over 30 Minutes Intravenous Every 8 hours 08/14/24 0750     08/14/24 0600  ceFEPIme  (MAXIPIME ) 2 g in sodium chloride  0.9 % 100 mL IVPB  Status:  Discontinued        2 g 200 mL/hr over 30 Minutes Intravenous Every 24 hours 08/13/24 0745 08/13/24 1134   08/13/24 2200  doxycycline  (VIBRAMYCIN ) 100 mg in sodium chloride  0.9 % 250 mL IVPB  100 mg 125 mL/hr over 120 Minutes Intravenous Every 12 hours 08/13/24 1025     08/13/24 1300  piperacillin -tazobactam (ZOSYN ) IVPB 3.375 g  Status:  Discontinued        3.375 g 12.5 mL/hr over 240 Minutes Intravenous Every 8 hours 08/13/24 1134 08/14/24 0750   08/13/24 0430  vancomycin  (VANCOCIN ) IVPB 1000 mg/200 mL premix        1,000 mg 200  mL/hr over 60 Minutes Intravenous  Once 08/13/24 0415 08/13/24 1002   08/13/24 0430  ceFEPIme  (MAXIPIME ) 2 g in sodium chloride  0.9 % 100 mL IVPB        2 g 200 mL/hr over 30 Minutes Intravenous  Once 08/13/24 0415 08/13/24 0627   08/13/24 0430  azithromycin (ZITHROMAX) 500 mg in sodium chloride  0.9 % 250 mL IVPB  Status:  Discontinued        500 mg 250 mL/hr over 60 Minutes Intravenous  Once 08/13/24 0415 08/13/24 0418   08/13/24 0430  doxycycline  (VIBRAMYCIN ) 100 mg in sodium chloride  0.9 % 250 mL IVPB        100 mg 125 mL/hr over 120 Minutes Intravenous  Once 08/13/24 0418 08/13/24 1136       OBJECTIVE  Blood pressure 91/64, pulse 65, temperature (!) 97.2 F (36.2 C), resp. rate 18, weight 43.2 kg, SpO2 90%.    Vent Mode: PRVC FiO2 (%):  [24 %-60 %] 24 % Set Rate:  [15 bmp-24 bmp] 18 bmp Vt Set:  [400 mL] 400 mL PEEP:  [5 cmH20-58 cmH20] 5 cmH20 Plateau Pressure:  [10 cmH20-16 cmH20] 14 cmH20   Intake/Output Summary (Last 24 hours) at 08/14/2024 1008 Last data filed at 08/14/2024 0957 Gross per 24 hour  Intake 1677.71 ml  Output 200 ml  Net 1477.71 ml   Filed Weights   08/13/24 0443 08/14/24 0500  Weight: 37 kg 43.2 kg   Physical Examination  GEN: Critically ill patient, intubated and sedated HEENT: [place]/AT. PERRL, sclerae anicteric. HEART: regular rhythm, normal rate, S1, S2, no M/R/G,  LUNGS: CTAB, diminished bilateral without wheezes, no increased WOB,  EXTREMITIES: No Edema, cap refill  NEURO: No gross focal deficits. PSYCH:  UTA ABDOMINAL: Soft: BS x 4, NTND SKIN: Intact, warm, no rashes lesion, or ulcer  Active Hospital Problem list   See systems below  Assessment & Plan:  #Acute Hypoxic Hypercapnic Respiratory Failure iso  #AECOPD #CAP Hx of COPD; smoker  -Daily SAT/SBT when appropriate -Trend Xray & ABGs, target permissive hypercapnia -Nicotine  replacement therapy  #Suspected Pneumonia / Infection CXR: hyperinflation with subtle RLL patchy airspace  disease Initial interventions/workup included: 2 L of NS/LR & cefepime , vancomycin , doxycycline  -F/u cultures, trend lactic/ PCT -Monitor WBC/ fever curve -Obtain MRSA nasal swab, RVP, legionella, strep urine Ag -Cont IV antibiotics De-escalate antibiotics as data returns -Gentle IVF hydration as needed  #AKI on CKD stage III: AKI likely iso above Cr 1.37 (baseline CKD) -Follow BMP+Mag -Ensure adequate renal perfusion -Avoid nephrotoxic  -Replace lytes -strict I&O  #NICM/HFrEF s/p AICD  #s/p [id number] Last Echo 01/2023 recovered EF >65%,  -Euvolemic on exam.  -BNP: 5944 -Chest XR (1/18): No pulmonary edema -Hold Home GDMT  -Strict I/Os -Obtain 2Decho -Hold Diuretics -Low threshold for cards consult  #Hyperglycemia Glucose 248 Likely stress and steroid related no hx of DM -Check A1c -CBG's q4hrs  -SSI -Target CBG readings 140 to 180 -Follow hypo/hyperglycemic protocol   #HIV/AIDS -Check Viral load , CD4 count -Per Last ID notes on prezcobix  and rilpavirine   -  Consult ID for HIV hx -Plan to restart HARRT once able to tolerate p.o.  Best practice:  Diet:  NPO Pain/Anxiety/Delirium protocol (if indicated): Yes (RASS goal -1) VAP protocol (if indicated): Yes DVT prophylaxis: Subcutaneous Heparin  GI prophylaxis: H2B Glucose control:  SSI Yes Central venous access:  N/A Arterial line:  N/A Foley:  Yes, and it is still needed Mobility:  bed rest  PT/OT consulted: N/A Code Status:  full code Disposition: ICU   = Goals of Care =  Primary Emergency Contact: Clay,Tamicka, Home Phone: [phone]   Labs/imaging that I havepersonally reviewed  (right click and Reselect all SmartList Selections daily)   ECHOCARDIOGRAM COMPLETE Result Date: 08/13/2024    ECHOCARDIOGRAM REPORT   Patient Name:   Claire Day Date of Exam: 08/13/2024 Medical Rec #:  [id number]        Height:       68.1 in Accession #:    [id number]       Weight:       81.6 lb Date of Birth:   [birth year]        BSA:          1.398 m Patient Age:    58 years         BP:           87/49 mmHg Patient Gender: F                HR:           92 bpm. Exam Location:  [facility] Procedure: 2D Echo, 3D Echo, Color Doppler, Cardiac Doppler and Strain Analysis            (Both Spectral and Color Flow Doppler were utilized during            procedure). Indications:     CHF-acute diastolic I50.31  History:         Patient has no prior history of Echocardiogram examinations.                  COPD, Arrythmias:LBBB; Risk Factors:Hypertension.  Sonographer:     Christopher Furnace Referring Phys:  [id number] ALMARIE BAKE OUMA Diagnosing Phys: Deatrice Cage MD  Sonographer Comments: Echo performed with patient supine and on artificial respirator. Global longitudinal strain was attempted. IMPRESSIONS  1. Left ventricular ejection fraction, by estimation, is 25 to 30%. Left ventricular ejection fraction by 3D volume is 24 %. The left ventricle has severely decreased function. The left ventricle demonstrates global hypokinesis. There is mild left ventricular hypertrophy. Left ventricular diastolic parameters are consistent with Grade I diastolic dysfunction (impaired relaxation). The average left ventricular global longitudinal strain is -6.1 %. The global longitudinal strain is abnormal.  2. Right ventricular systolic function is normal. The right ventricular size is normal. There is normal pulmonary artery systolic pressure.  3. The mitral valve is normal in structure. No evidence of mitral valve regurgitation. No evidence of mitral stenosis.  4. The aortic valve is normal in structure. Aortic valve regurgitation is moderate. Aortic valve sclerosis/calcification is present, without any evidence of aortic stenosis. FINDINGS  Left Ventricle: Left ventricular ejection fraction, by estimation, is 25 to 30%. Left ventricular ejection fraction by 3D volume is 24 %. The left ventricle has severely decreased function. The left ventricle  demonstrates global hypokinesis. The average  left ventricular global longitudinal strain is -6.1 %. Strain was performed and the global longitudinal strain is abnormal. The left ventricular internal cavity size was normal in size. There is  mild left ventricular hypertrophy. Left ventricular diastolic parameters are consistent with Grade I diastolic dysfunction (impaired relaxation). Right Ventricle: The right ventricular size is normal. No increase in right ventricular wall thickness. Right ventricular systolic function is normal. There is normal pulmonary artery systolic pressure. The tricuspid regurgitant velocity is 2.71 m/s, and  with an assumed right atrial pressure of 5 mmHg, the estimated right ventricular systolic pressure is 34.4 mmHg. Left Atrium: Left atrial size was normal in size. Right Atrium: Right atrial size was normal in size. Pericardium: Trivial pericardial effusion is present. Mitral Valve: The mitral valve is normal in structure. No evidence of mitral valve regurgitation. No evidence of mitral valve stenosis. Tricuspid Valve: The tricuspid valve is normal in structure. Tricuspid valve regurgitation is mild . No evidence of tricuspid stenosis. Aortic Valve: The aortic valve is normal in structure. Aortic valve regurgitation is moderate. Aortic regurgitation PHT measures 469 msec. Aortic valve sclerosis/calcification is present, without any evidence of aortic stenosis. Aortic valve mean gradient measures 4.0 mmHg. Aortic valve peak gradient measures 6.8 mmHg. Aortic valve area, by VTI measures 2.21 cm. Pulmonic Valve: The pulmonic valve was normal in structure. Pulmonic valve regurgitation is trivial. No evidence of pulmonic stenosis. Aorta: The aortic root is normal in size and structure. Venous: The inferior vena cava was not well visualized. IAS/Shunts: No atrial level shunt detected by color flow Doppler. Additional Comments: A device lead is visualized.  LEFT VENTRICLE PLAX 2D LVIDd:          4.70 cm         Diastology LVIDs:         4.00 cm         LV e' medial:    6.20 cm/s LV PW:         1.30 cm         LV E/e' medial:  10.1 LV IVS:        1.30 cm         LV e' lateral:   5.00 cm/s LVOT diam:     2.10 cm         LV E/e' lateral: 12.6 LV SV:         35 LV SV Index:   25              2D Longitudinal LVOT Area:     3.46 cm        Strain LV IVRT:       114 msec        2D Strain GLS   -6.1 %                                Avg:  LV Volumes (MOD)               3D Volume EF LV vol d, MOD    127.0 ml      LV 3D EF:    Left A2C:                                        ventricul LV vol d, MOD    115.0 ml                   ar A4C:  ejection LV vol s, MOD    96.3 ml                    fraction A2C:                                        by 3D LV vol s, MOD    85.6 ml                    volume is A4C:                                        24 %. LV SV MOD A2C:   30.7 ml LV SV MOD A4C:   115.0 ml LV SV MOD BP:    30.1 ml       3D Volume EF:                                3D EF:        24 %                                LV EDV:       189 ml                                LV ESV:       144 ml                                LV SV:        45 ml RIGHT VENTRICLE RV Basal diam:  3.60 cm RV Mid diam:    3.20 cm LEFT ATRIUM             Index        RIGHT ATRIUM          Index LA diam:        2.80 cm 2.00 cm/m   RA Area:     8.05 cm LA Vol (A2C):   43.6 ml 31.19 ml/m  RA Volume:   11.60 ml 8.30 ml/m LA Vol (A4C):   24.8 ml 17.74 ml/m LA Biplane Vol: 37.6 ml 26.90 ml/m  AORTIC VALVE AV Area (Vmax):    2.00 cm AV Area (Vmean):   1.73 cm AV Area (VTI):     2.21 cm AV Vmax:           130.00 cm/s AV Vmean:          90.633 cm/s AV VTI:            0.157 m AV Peak Grad:      6.8 mmHg AV Mean Grad:      4.0 mmHg LVOT Vmax:         75.00 cm/s LVOT Vmean:        45.400 cm/s LVOT VTI:          0.100 m LVOT/AV VTI ratio: 0.64 AI PHT:            469 msec  AORTA Ao Root diam:  2.70 cm MITRAL  VALVE               TRICUSPID VALVE MV Area (PHT): 3.79 cm    TR Peak grad:   29.4 mmHg MV Decel Time: 200 msec    TR Vmax:        271.00 cm/s MV E velocity: 62.90 cm/s MV A velocity: 87.50 cm/s  SHUNTS MV E/A ratio:  0.72        Systemic VTI:  0.10 m                            Systemic Diam: 2.10 cm Deatrice Cage MD Electronically signed by Deatrice Cage MD Signature Date/Time: 08/13/2024/4:06:48 PM    Final    DG Chest Port 1 View Result Date: 08/13/2024 CLINICAL DATA:  Central line placement. EXAM: PORTABLE CHEST 1 VIEW COMPARISON:  Radiographs 08/13/2024.  CT 05/10/2024. FINDINGS: 1218 hours. Two views submitted. New right IJ central venous catheter projects to the mid SVC level. Endotracheal tube, enteric tube and left subclavian AICD leads appear unchanged. The heart size and mediastinal contours are stable. Mild atelectasis at both lung bases without evidence of confluent airspace disease, significant pleural effusion or pneumothorax. The bones appear unchanged. IMPRESSION: New right IJ central venous catheter projects to the mid SVC level. No evidence of pneumothorax. Electronically Signed   By: Elsie Perone M.D.   On: 08/13/2024 12:48   DG Abd 1 View Result Date: 08/13/2024 EXAM: 1 VIEW XRAY OF THE ABDOMEN 08/13/2024 07:07:00 AM COMPARISON: Abdomen film today at 4:53 am, abdomen film 06/26/2024. CLINICAL HISTORY: 360298 Feeding tube dysfunction 360298 Feeding tube dysfunction FINDINGS: LINES, TUBES AND DEVICES: NGT positioning is unchanged with the tip abutting the proximal fundal wall and the side port at the EG junction. Advancement is recommended to position the side port below the hiatus. The bladder is catheterized. BOWEL: Nonobstructive bowel gas pattern. There is mild to moderate retained stool in the cecum and rectum. Again noted 4 cm dilatation of a central abdominal post-surgical small bowel segment. No acute obstruction is suspected. SOFT TISSUES: Several bilateral kidney stones are  again noted. No new pathologic calcification. Surgical changes in the right upper abdomen. BONES: No acute fracture. IMPRESSION: 1. NGT tip abutting the proximal fundal wall and side port at the EG junction; advancement is recommended to position the side port below the hiatus. Electronically signed by: Francis Quam MD 08/13/2024 07:30 AM EST RP Workstation: [id number]   DG Abdomen 1 View Result Date: 08/13/2024 CLINICAL DATA:  OG tube placement. EXAM: ABDOMEN - 1 VIEW COMPARISON:  06/26/2024 FINDINGS: OG tube tip is in the proximal stomach with side port position in the region of the GE junction. IMPRESSION: OG tube tip is in the proximal stomach with side port position in the region of the GE junction. Tube could be advanced approximately 5 cm to ensure side port positioning below the GE junction. Repeat x-ray recommended after repositioning. Electronically Signed   By: Camellia Candle M.D.   On: 08/13/2024 05:10   DG Chest Portable 1 View Result Date: 08/13/2024 CLINICAL DATA:  Ventilator dependence.  Status post intubation. EXAM: PORTABLE CHEST 1 VIEW COMPARISON:  08/13/2024, earlier same day FINDINGS: Scratchendotracheal tube tip is approximately 4.3 cm above the base of the carina. The NG tube passes into the stomach although the distal tip position is not included on the film. Cardiopericardial silhouette is at upper limits of normal for size. Lungs  are hyperexpanded patchy airspace disease seen in the right base on the previous study is less prominent on the current exam. Similar blunting of the left costophrenic angle, chronic. Telemetry leads overlie the chest. IMPRESSION: 1. Endotracheal tube tip is approximately 4.3 cm above the base of the carina. 2. Interval decrease in patchy airspace disease at the right base. Electronically Signed   By: Camellia Candle M.D.   On: 08/13/2024 05:10   DG Chest Portable 1 View Result Date: 08/13/2024 CLINICAL DATA:  Shortness of breath. EXAM: PORTABLE CHEST 1 VIEW  COMPARISON:  07/25/2024 FINDINGS: Lungs are hyperexpanded. Cardiopericardial silhouette is at upper limits of normal for size. Stable blunting of the left costophrenic angle. Subtle patchy airspace disease noted at the right lung base. No pulmonary edema or focal lung consolidation. Left-sided permanent pacemaker/AICD again noted. Telemetry leads overlie the chest. IMPRESSION: Subtle patchy airspace disease at the right lung base. Imaging features could be related to atelectasis or developing infiltrate. Electronically Signed   By: Camellia Candle M.D.   On: 08/13/2024 05:06     Labs   CBC: Recent Labs  Lab 08/13/24 0411 08/14/24 0434  WBC 9.5 11.5*  NEUTROABS 4.2  --   HGB 13.2 12.2  HCT 42.2 38.2  MCV 93.4 90.5  PLT 215 187    Basic Metabolic Panel: Recent Labs  Lab 08/13/24 0411 08/13/24 1714 08/14/24 0434  NA 141  --  139  K 4.1 4.7 4.9  CL 109  --  108  CO2 18*  --  20*  GLUCOSE 248*  --  101*  BUN 21*  --  37*  CREATININE 1.37*  --  2.76*  CALCIUM  8.5*  --  7.9*  MG  --  2.4 2.4  PHOS  --   --  5.5*   GFR: Estimated Creatinine Clearance: 15.2 mL/min (A) (by C-G formula based on SCr of 2.76 mg/dL (H)). Recent Labs  Lab 08/13/24 0411 08/13/24 0902 08/13/24 1108 08/13/24 2252 08/14/24 0434  WBC 9.5  --   --   --  11.5*  LATICACIDVEN 5.0* 1.4 2.8* 2.7*  --     Liver Function Tests: Recent Labs  Lab 08/13/24 0411  AST 36  ALT 17  ALKPHOS 129*  BILITOT 0.2  PROT 5.9*  ALBUMIN 3.3*   No results for input(s): LIPASE, AMYLASE in the last 168 hours. No results for input(s): AMMONIA in the last 168 hours.  ABG    Component Value Date/Time   PHART 7.25 (L) 08/14/2024 0437   PCO2ART 44 08/14/2024 0437   PO2ART 95 08/14/2024 0437   HCO3 19.3 (L) 08/14/2024 0437   ACIDBASEDEF 7.7 (H) 08/14/2024 0437   O2SAT 98.4 08/14/2024 0437     Coagulation Profile: Recent Labs  Lab 08/13/24 0411  INR 1.1    Cardiac Enzymes: No results for input(s):  CKTOTAL, CKMB, CKMBINDEX, TROPONINI in the last 168 hours.  HbA1C: Hgb A1c MFr Bld  Date/Time Value Ref Range Status  08/13/2024 04:11 AM 5.6 4.8 - 5.6 % Final    Comment:    (NOTE) Diagnosis of Diabetes The following HbA1c ranges recommended by the American Diabetes Association (ADA) may be used as an aid in the diagnosis of diabetes mellitus.  Hemoglobin             Suggested A1C NGSP%              Diagnosis  <5.7  Non Diabetic  5.7-6.4                Pre-Diabetic  >6.4                   Diabetic  <7.0                   Glycemic control for                       adults with diabetes.      CBG: Recent Labs  Lab 08/13/24 1749 08/13/24 1909 08/13/24 2302 08/14/24 0335 08/14/24 0746  GLUCAP 188* 192* 139* 94 85    Review of Systems:   Unable to be obtained secondary to the patient's intubated and sedated status.   Past Medical History  She,  has a past medical history of Allergic rhinitis, Anemia, Aortic insufficiency, Cerebral aneurysm (02/12/2023), Chronic systolic heart failure (HCC), CKD (chronic kidney disease), stage III (HCC), COPD (chronic obstructive pulmonary disease) (HCC), History of kidney stones, HIV (human immunodeficiency virus infection) (HCC) (2003), HLD (hyperlipidemia), Hydronephrosis, Hypertension, LBBB (left bundle branch block), MI (myocardial infarction) (HCC), Moderate mitral regurgitation, Nephrolithiasis, NICM (nonischemic cardiomyopathy) (HCC), Pneumonia due to Streptococcus, 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   Surgical History    Past Surgical History:  Procedure Laterality Date   BI-VENTRICULAR IMPLANTABLE CARDIOVERTER DEFIBRILLATOR  (CRT-D) Left 09/20/2017   Procedure: BI-VENTRICULAR  IMPLANTABLE CARDIOVERTER DEFIBRILLATOR (CRT-D); Location: [facility]; Surgeon: Nancyann Ceo, MD   CHOLECYSTECTOMY     CYSTOSCOPY W/ URETERAL STENT PLACEMENT Left 06/10/2023   Procedure: CYSTOSCOPY WITH RETROGRADE PYELOGRAM/URETERAL STENT PLACEMENT;  Surgeon: Francisca Redell BROCKS, MD;  Location: [facility];  Service: Urology;  Laterality: Left;   CYSTOSCOPY/URETEROSCOPY/HOLMIUM LASER/STENT PLACEMENT Left 07/05/2023   Procedure: CYSTOSCOPY/URETEROSCOPY/HOLMIUM LASER/STENT EXCHANGE;  Surgeon: Twylla Glendia BROCKS, MD;  Location: [facility];  Service: Urology;  Laterality: Left;     Social History   reports that she has been smoking cigarettes. She does not have any smokeless tobacco history on file. She reports that she does not drink alcohol and does not use drugs.   Family History   Her family history is not on file.   Allergies Allergies[1]   Home Medications  Prior to Admission medications  Medication Sig Start Date End Date Taking? Authorizing Provider  albuterol  (VENTOLIN  HFA) 108 (90 Base) MCG/ACT inhaler Inhale 2 puffs into the lungs every 4 (four) hours as needed for wheezing or shortness of breath. 11/14/21   Cleotilde Redell, MD  dapsone  100 MG tablet Take 100 mg by mouth at bedtime.    [provider]  darunavir -cobicistat  (PREZCOBIX ) 800-150 MG tablet Take 1 tablet by mouth at bedtime. Swallow whole. Do NOT crush, break or chew tablets. Take with food.    [provider]  ipratropium-albuterol  (DUONEB) 0.5-2.5 (3) MG/3ML SOLN Take 3 mLs by nebulization every 6 (six) hours as needed (Shortness of breath and wheezing). 12/05/23   Caleen Qualia, MD  mirtazapine  (REMERON ) 15 MG tablet Take 15 mg by mouth at bedtime. 02/19/24   [provider]  rilpivirine  (EDURANT ) 25 MG TABS tablet Take 25 mg by mouth at bedtime.    [provider]  Tiotropium Bromide-Olodaterol (STIOLTO RESPIMAT) 2.5-2.5 MCG/ACT AERS Inhale 2 puffs into the lungs daily as needed (wheezing, shortness of  breath).    [provider]   Scheduled Meds:  Chlorhexidine  Gluconate Cloth  6 each Topical Daily   heparin   5,000 Units Subcutaneous Q8H   insulin  aspart  0-15 Units Subcutaneous Q4H   ipratropium-albuterol   3 mL Nebulization Q6H   mouth rinse  15 mL Mouth Rinse Q2H   polyethylene glycol  17 g Per Tube Daily   senna  1 tablet Per Tube BID   Continuous Infusions:  sodium chloride  Stopped (08/13/24 0956)   doxycycline  (VIBRAMYCIN ) IV Stopped (08/13/24 2324)   famotidine  (PEPCID ) IV Stopped (08/14/24 0818)   fentaNYL  infusion INTRAVENOUS 50 mcg/hr (08/14/24 0957)   norepinephrine  (LEVOPHED ) Adult infusion 10 mcg/min (08/14/24 0957)   piperacillin -tazobactam (ZOSYN )  IV     propofol  (DIPRIVAN ) infusion 30 mcg/kg/min (08/14/24 0957)   vasopressin  0.02 Units/min (08/14/24 0957)   PRN Meds:.acetaminophen , fentaNYL , ipratropium-albuterol , midazolam  PF, mouth rinse, polyethylene glycol, senna     Critical care provider statement:   Total critical care time: 63 minutes   Performed by: Parris MD   Critical care time was exclusive of separately billable procedures and treating other patients.   Critical care was necessary to treat or prevent imminent or life-threatening deterioration.   Critical care was time spent personally by me on the following activities: development of treatment plan with patient and/or surrogate as well as nursing, discussions with consultants, evaluation of patient's response to treatment, examination of patient, obtaining history from patient or surrogate, ordering and performing treatments and interventions, ordering and review of laboratory studies, ordering and review of radiographic studies, pulse oximetry and re-evaluation of patient's condition.    Kien Mirsky, M.D.  Pulmonary & Critical Care Medicine            [1]  Allergies Allergen Reactions   Sulfonamide Derivatives Hives and Dermatitis   Tramadol Other (See Comments)     Upset stomach and it gave her the shakes    "

## 2024-08-14 NOTE — Progress Notes (Signed)
 PHARMACY NOTE:  ANTIMICROBIAL RENAL DOSAGE ADJUSTMENT  Current antimicrobial regimen includes a mismatch between antimicrobial dosage and estimated renal function.  As per policy approved by the Pharmacy & Therapeutics and Medical Executive Committees, the antimicrobial dosage will be adjusted accordingly.  Current antimicrobial dosage:  piperacillin -tazobactam 3.375 grams IV every 8 hours  Indication: asp PNA  Renal Function:  Estimated Creatinine Clearance: 15.2 mL/min (A) (by C-G formula based on SCr of 2.76 mg/dL (H)).    Antimicrobial dosage has been changed to:  piperacillin -tazobactam 2.25 grams IV every 8 hours   Thank you for allowing pharmacy to be a part of this patient's care.  Adriana JONETTA Bolster, [facility] 08/14/2024 7:48 AM

## 2024-08-14 NOTE — Progress Notes (Signed)
 This RN completed bedside swallow eval. No coughing, choking, or adverse reactions to ice chips, water on a spoon, or water through straw. Order to advance diet from Inge Lecher, NP.

## 2024-08-14 NOTE — Progress Notes (Signed)
 Pt extubated with no stridor noted, rr 12/min, sats 95% on 6L [place].

## 2024-08-14 NOTE — Plan of Care (Signed)
" °  Problem: Coping: Goal: Ability to adjust to condition or change in health will improve Outcome: Progressing   Problem: Fluid Volume: Goal: Ability to maintain a balanced intake and output will improve Outcome: Progressing   Problem: Skin Integrity: Goal: Risk for impaired skin integrity will decrease Outcome: Progressing   Problem: Tissue Perfusion: Goal: Adequacy of tissue perfusion will improve Outcome: Progressing   Problem: Clinical Measurements: Goal: Will remain free from infection Outcome: Progressing Goal: Diagnostic test results will improve Outcome: Progressing Goal: Respiratory complications will improve Outcome: Progressing Goal: Cardiovascular complication will be avoided Outcome: Progressing   "

## 2024-08-14 NOTE — Progress Notes (Signed)
 "  Date of Admission:  08/13/2024     ID: RAEGEN TARPLEY is a 59 y.o. female  Principal Problem:   Acute on chronic respiratory failure with hypoxia and hypercapnia (HCC) Active Problems:   Protein-calorie malnutrition, severe  DREAMA KUNA is a 59 y.o. female with a history of HIV/AIDS diagnosed in 2002 on darunavir /cobicistat  and rilpivirine , COPD, nonischemic cardiomyopathy, LBBB, AICD/pacemaker, CKD, nephrolithiasis was recently in the hospital with COPD exacerbation and also had flu A and COVID infections.  She was treated with Tamiflu  under HIV medications were continued.  She was discharged on 07/27/2024 She presents to the ED he on 08/13/2024 with shortness of breath and tripoding.  She had received magnesium  IV Vitals in the ED BP of 156/107, heart rate 134, respiratory 36, sats 68% and temperature was 98.4.  she was emergently intubated.  Subjective: Pt has been extubated Doing better she states   Medications:   Chlorhexidine  Gluconate Cloth  6 each Topical Daily   heparin   5,000 Units Subcutaneous Q8H   insulin  aspart  0-15 Units Subcutaneous Q4H   ipratropium-albuterol   3 mL Nebulization Q6H   mouth rinse  15 mL Mouth Rinse Q2H   polyethylene glycol  17 g Per Tube Daily   senna  1 tablet Per Tube BID    Objective: Vital signs in last 24 hours: Patient Vitals for the past 24 hrs:  BP Temp Temp src Pulse Resp SpO2 Weight  08/14/24 1430 92/68 (!) 97 F (36.1 C) -- 94 14 97 % --  08/14/24 1419 -- (!) 96.8 F (36 C) -- 91 16 96 % --  08/14/24 1415 96/65 (!) 96.8 F (36 C) -- 96 (!) 23 93 % --  08/14/24 1200 98/63 (!) 96.3 F (35.7 C) -- 73 18 93 % --  08/14/24 1145 97/61 (!) 96.4 F (35.8 C) -- 74 14 92 % --  08/14/24 1143 97/61 (!) 96.3 F (35.7 C) -- 78 14 99 % --  08/14/24 1130 101/64 (!) 96.1 F (35.6 C) -- (!) 48 19 92 % --  08/14/24 1115 98/61 (!) 96.3 F (35.7 C) -- 62 18 92 % --  08/14/24 1100 (!) 97/58 (!) 96.8 F (36 C) -- 65 18 94 % --  08/14/24  1045 (!) 100/59 (!) 97 F (36.1 C) -- 62 18 94 % --  08/14/24 1030 101/60 (!) 97 F (36.1 C) -- 60 18 94 % --  08/14/24 1015 (!) 99/58 (!) 97.2 F (36.2 C) -- 61 18 93 % --  08/14/24 1000 103/61 (!) 97.2 F (36.2 C) -- 60 18 95 % --  08/14/24 0945 91/64 (!) 97.2 F (36.2 C) -- 65 18 90 % --  08/14/24 0930 (!) 81/58 (!) 97.3 F (36.3 C) -- 73 18 92 % --  08/14/24 0904 (!) 89/56 -- -- 67 18 99 % --  08/14/24 0900 104/71 (!) 97.5 F (36.4 C) -- 98 18 (!) 88 % --  08/14/24 0847 108/68 97.7 F (36.5 C) -- 61 (!) 21 100 % --  08/14/24 0845 108/68 97.7 F (36.5 C) -- 60 (!) 24 100 % --  08/14/24 0830 111/69 97.7 F (36.5 C) -- 62 (!) 24 100 % --  08/14/24 0815 113/72 97.7 F (36.5 C) -- 60 (!) 24 100 % --  08/14/24 0800 114/74 97.7 F (36.5 C) -- 60 (!) 24 100 % --  08/14/24 0745 116/78 97.7 F (36.5 C) -- (!) 59 (!) 24 100 % --  08/14/24 0730 115/73 97.7 F (36.5 C) -- 62 (!) 24 100 % --  08/14/24 0715 114/77 (!) 97.5 F (36.4 C) -- 62 (!) 24 100 % --  08/14/24 0700 112/79 (!) 97.5 F (36.4 C) Bladder 62 (!) 24 100 % --  08/14/24 0645 111/71 (!) 97.5 F (36.4 C) -- (!) 59 (!) 24 100 % --  08/14/24 0630 110/74 (!) 97.5 F (36.4 C) -- 62 (!) 24 100 % --  08/14/24 0615 109/72 97.7 F (36.5 C) -- (!) 59 (!) 24 100 % --  08/14/24 0600 111/77 (!) 97.5 F (36.4 C) -- 66 (!) 24 100 % --  08/14/24 0545 107/71 (!) 97.5 F (36.4 C) -- 63 (!) 23 100 % --  08/14/24 0530 114/61 (!) 97.5 F (36.4 C) -- 71 (!) 24 100 % --  08/14/24 0515 124/84 (!) 97.5 F (36.4 C) -- 90 (!) 24 100 % --  08/14/24 0500 112/71 97.7 F (36.5 C) -- 60 (!) 24 100 % 43.2 kg  08/14/24 0445 101/72 97.7 F (36.5 C) -- 70 (!) 24 100 % --  08/14/24 0430 112/75 97.7 F (36.5 C) -- 64 (!) 24 100 % --  08/14/24 0415 113/75 97.7 F (36.5 C) -- 84 (!) 24 100 % --  08/14/24 0400 110/72 97.7 F (36.5 C) -- 65 (!) 24 98 % --  08/14/24 0345 112/75 97.9 F (36.6 C) -- 62 (!) 24 100 % --  08/14/24 0330 112/73 97.9 F  (36.6 C) -- 62 (!) 24 100 % --  08/14/24 0315 108/73 97.9 F (36.6 C) -- 64 (!) 24 100 % --  08/14/24 0300 107/70 97.9 F (36.6 C) -- 65 (!) 24 100 % --  08/14/24 0245 104/71 98.1 F (36.7 C) -- 64 (!) 24 100 % --  08/14/24 0230 104/67 98.1 F (36.7 C) -- 60 (!) 24 99 % --  08/14/24 0215 101/71 98.2 F (36.8 C) -- (!) 59 (!) 24 100 % --  08/14/24 0200 109/78 98.2 F (36.8 C) -- 62 (!) 24 100 % --  08/14/24 0145 119/80 98.2 F (36.8 C) -- 77 (!) 24 100 % --  08/14/24 0130 120/81 98.2 F (36.8 C) -- 79 (!) 24 100 % --  08/14/24 0115 126/84 98.2 F (36.8 C) -- 80 (!) 24 100 % --  08/14/24 0100 (!) 116/92 98.2 F (36.8 C) -- 77 (!) 24 100 % --  08/14/24 0045 116/87 98.2 F (36.8 C) -- 76 (!) 24 100 % --  08/14/24 0030 115/72 98.1 F (36.7 C) -- 61 (!) 24 100 % --  08/14/24 0015 115/68 98.1 F (36.7 C) -- 63 (!) 24 100 % --  08/14/24 0000 116/74 98.2 F (36.8 C) -- 63 (!) 24 100 % --  08/13/24 2345 -- 98.1 F (36.7 C) -- (!) 59 18 100 % --  08/13/24 2344 117/70 98.1 F (36.7 C) -- 64 11 100 % --  08/13/24 2330 119/78 98.1 F (36.7 C) -- 61 (!) 24 100 % --  08/13/24 2315 117/73 98.1 F (36.7 C) -- (!) 59 (!) 24 100 % --  08/13/24 2300 118/74 97.9 F (36.6 C) -- 62 (!) 24 100 % --  08/13/24 2245 130/83 97.9 F (36.6 C) -- 78 (!) 24 100 % --  08/13/24 2230 120/75 97.9 F (36.6 C) -- 61 (!) 24 100 % --  08/13/24 2215 112/72 97.7 F (36.5 C) -- (!) 59 (!) 24 100 % --  08/13/24 2200 107/68 97.7 F (36.5 C) -- 63 (!) 24 100 % --  08/13/24 2145 120/74 97.7 F (36.5 C) -- 63 (!) 24 100 % --  08/13/24 2130 108/71 97.7 F (36.5 C) -- 68 (!) 24 99 % --  08/13/24 2115 104/73 98.2 F (36.8 C) -- 68 (!) 24 99 % --  08/13/24 2100 -- 99.1 F (37.3 C) -- 69 (!) 24 98 % --  08/13/24 2045 97/62 99.9 F (37.7 C) -- 70 (!) 24 98 % --  08/13/24 2030 95/62 100 F (37.8 C) -- 70 (!) 24 98 % --  08/13/24 2015 94/63 100 F (37.8 C) -- 69 (!) 24 100 % --  08/13/24 2000 107/70 100.2 F  (37.9 C) Bladder 60 17 99 % --  08/13/24 1945 97/65 100.2 F (37.9 C) -- 74 (!) 24 98 % --  08/13/24 1930 97/63 100.2 F (37.9 C) -- 71 (!) 24 99 % --  08/13/24 1915 90/70 100.2 F (37.9 C) -- 76 (!) 24 100 % --  08/13/24 1900 92/70 100 F (37.8 C) -- 78 (!) 24 99 % --  08/13/24 1845 95/71 100 F (37.8 C) -- 75 (!) 24 99 % --  08/13/24 1830 94/70 100 F (37.8 C) -- 77 (!) 24 99 % --  08/13/24 1815 95/71 99.9 F (37.7 C) -- 77 (!) 24 98 % --  08/13/24 1800 91/72 99.9 F (37.7 C) Bladder 82 (!) 24 98 % --  08/13/24 1745 95/71 99.9 F (37.7 C) -- 83 (!) 24 99 % --  08/13/24 1730 98/73 99.7 F (37.6 C) -- 81 (!) 24 99 % --  08/13/24 1715 101/71 99.7 F (37.6 C) -- 81 (!) 24 99 % --  08/13/24 1702 97/70 99.5 F (37.5 C) -- 64 (!) 24 99 % --  08/13/24 1700 -- 99.5 F (37.5 C) -- (!) 42 (!) 24 99 % --  08/13/24 1645 (!) 87/56 99.5 F (37.5 C) Bladder 87 (!) 24 98 % --  08/13/24 1630 (!) 88/69 99.5 F (37.5 C) -- 90 (!) 24 96 % --  08/13/24 1619 -- 99.5 F (37.5 C) -- 94 (!) 24 98 % --  08/13/24 1615 (!) 80/61 99.3 F (37.4 C) -- 95 (!) 24 97 % --  08/13/24 1600 (!) 76/60 99.3 F (37.4 C) -- 97 20 96 % --  08/13/24 1545 102/71 99.3 F (37.4 C) -- 94 20 96 % --  08/13/24 1530 103/75 99.3 F (37.4 C) -- 87 20 99 % --  08/13/24 1515 100/73 99.3 F (37.4 C) -- 89 20 99 % --  08/13/24 1500 98/72 99.3 F (37.4 C) -- 94 20 99 % --  08/13/24 1445 98/70 99.3 F (37.4 C) -- 91 20 95 % --     Lines and Device Date on insertion # of days DC  Chiropractor     Foley     ETT       PHYSICAL EXAM:  General: Alert, cooperative, no distress, appears stated age.  Head: Normocephalic, without obvious abnormality, atraumatic. Eyes: Conjunctivae clear, anicteric sclerae. Pupils are equal ENT Nares normal. No drainage or sinus tenderness. Lips, mucosa, and tongue normal. No Thrush Neck: Supple, symmetrical, no adenopathy, thyroid: non tender no carotid bruit and no  JVD. Back: No CVA tenderness. Lungs: Clear to auscultation bilaterally. No Wheezing or Rhonchi. No rales. Heart: Regular rate and rhythm, no murmur, rub or gallop. Abdomen: Soft, non-tender,not distended. Bowel sounds normal.  No masses Extremities: atraumatic, no cyanosis. No edema. No clubbing Skin: No rashes or lesions. Or bruising Lymph: Cervical, supraclavicular normal. Neurologic: Grossly non-focal  Lab Results    Latest Ref Rng & Units 08/14/2024    4:34 AM 08/13/2024    4:11 AM 07/26/2024    4:11 AM  CBC  WBC 4.0 - 10.5 K/uL 11.5  9.5  5.8   Hemoglobin 12.0 - 15.0 g/dL 87.7  86.7  86.0   Hematocrit 36.0 - 46.0 % 38.2  42.2  42.7   Platelets 150 - 400 K/uL 187  215  91        Latest Ref Rng & Units 08/14/2024    4:34 AM 08/13/2024    5:14 PM 08/13/2024    4:11 AM  CMP  Glucose 70 - 99 mg/dL 898   751   BUN 6 - 20 mg/dL 37   21   Creatinine 9.55 - 1.00 mg/dL 7.23   8.62   Sodium 864 - 145 mmol/L 139   141   Potassium 3.5 - 5.1 mmol/L 4.9  4.7  4.1   Chloride 98 - 111 mmol/L 108   109   CO2 22 - 32 mmol/L 20   18   Calcium  8.9 - 10.3 mg/dL 7.9   8.5   Total Protein 6.5 - 8.1 g/dL   5.9   Total Bilirubin 0.0 - 1.2 mg/dL   0.2   Alkaline Phos 38 - 126 U/L   129   AST 15 - 41 U/L   36   ALT 0 - 44 U/L   17       Microbiology:  Studies/Results: ECHOCARDIOGRAM COMPLETE Result Date: 08/13/2024    ECHOCARDIOGRAM REPORT   Patient Name:   JATOYA ARMBRISTER Date of Exam: 08/13/2024 Medical Rec #:  [id number]        Height:       68.1 in Accession #:    [id number]       Weight:       81.6 lb Date of Birth:  17-Aug-1965        BSA:          1.398 m Patient Age:    58 years         BP:           87/49 mmHg Patient Gender: F                HR:           92 bpm. Exam Location:  [facility] Procedure: 2D Echo, 3D Echo, Color Doppler, Cardiac Doppler and Strain Analysis            (Both Spectral and Color Flow Doppler were utilized during            procedure). Indications:     CHF-acute diastolic  I50.31  History:         Patient has no prior history of Echocardiogram examinations.                  COPD, Arrythmias:LBBB; Risk Factors:Hypertension.  Sonographer:     Christopher Furnace Referring Phys:  [id number] ALMARIE BAKE OUMA Diagnosing Phys: Deatrice Cage MD  Sonographer Comments: Echo performed with patient supine and on artificial respirator. Global longitudinal strain was attempted. IMPRESSIONS  1. Left ventricular ejection fraction, by estimation, is 25 to 30%. Left ventricular ejection fraction by 3D volume is 24 %. The left ventricle has severely decreased function. The left ventricle demonstrates global  hypokinesis. There is mild left ventricular hypertrophy. Left ventricular diastolic parameters are consistent with Grade I diastolic dysfunction (impaired relaxation). The average left ventricular global longitudinal strain is -6.1 %. The global longitudinal strain is abnormal.  2. Right ventricular systolic function is normal. The right ventricular size is normal. There is normal pulmonary artery systolic pressure.  3. The mitral valve is normal in structure. No evidence of mitral valve regurgitation. No evidence of mitral stenosis.  4. The aortic valve is normal in structure. Aortic valve regurgitation is moderate. Aortic valve sclerosis/calcification is present, without any evidence of aortic stenosis. FINDINGS  Left Ventricle: Left ventricular ejection fraction, by estimation, is 25 to 30%. Left ventricular ejection fraction by 3D volume is 24 %. The left ventricle has severely decreased function. The left ventricle demonstrates global hypokinesis. The average  left ventricular global longitudinal strain is -6.1 %. Strain was performed and the global longitudinal strain is abnormal. The left ventricular internal cavity size was normal in size. There is mild left ventricular hypertrophy. Left ventricular diastolic parameters are consistent with Grade I diastolic dysfunction (impaired relaxation). Right  Ventricle: The right ventricular size is normal. No increase in right ventricular wall thickness. Right ventricular systolic function is normal. There is normal pulmonary artery systolic pressure. The tricuspid regurgitant velocity is 2.71 m/s, and  with an assumed right atrial pressure of 5 mmHg, the estimated right ventricular systolic pressure is 34.4 mmHg. Left Atrium: Left atrial size was normal in size. Right Atrium: Right atrial size was normal in size. Pericardium: Trivial pericardial effusion is present. Mitral Valve: The mitral valve is normal in structure. No evidence of mitral valve regurgitation. No evidence of mitral valve stenosis. Tricuspid Valve: The tricuspid valve is normal in structure. Tricuspid valve regurgitation is mild . No evidence of tricuspid stenosis. Aortic Valve: The aortic valve is normal in structure. Aortic valve regurgitation is moderate. Aortic regurgitation PHT measures 469 msec. Aortic valve sclerosis/calcification is present, without any evidence of aortic stenosis. Aortic valve mean gradient measures 4.0 mmHg. Aortic valve peak gradient measures 6.8 mmHg. Aortic valve area, by VTI measures 2.21 cm. Pulmonic Valve: The pulmonic valve was normal in structure. Pulmonic valve regurgitation is trivial. No evidence of pulmonic stenosis. Aorta: The aortic root is normal in size and structure. Venous: The inferior vena cava was not well visualized. IAS/Shunts: No atrial level shunt detected by color flow Doppler. Additional Comments: A device lead is visualized.  LEFT VENTRICLE PLAX 2D LVIDd:         4.70 cm         Diastology LVIDs:         4.00 cm         LV e' medial:    6.20 cm/s LV PW:         1.30 cm         LV E/e' medial:  10.1 LV IVS:        1.30 cm         LV e' lateral:   5.00 cm/s LVOT diam:     2.10 cm         LV E/e' lateral: 12.6 LV SV:         35 LV SV Index:   25              2D Longitudinal LVOT Area:     3.46 cm        Strain LV IVRT:       114 msec  2D  Strain GLS   -6.1 %                                Avg:  LV Volumes (MOD)               3D Volume EF LV vol d, MOD    127.0 ml      LV 3D EF:    Left A2C:                                        ventricul LV vol d, MOD    115.0 ml                   ar A4C:                                        ejection LV vol s, MOD    96.3 ml                    fraction A2C:                                        by 3D LV vol s, MOD    85.6 ml                    volume is A4C:                                        24 %. LV SV MOD A2C:   30.7 ml LV SV MOD A4C:   115.0 ml LV SV MOD BP:    30.1 ml       3D Volume EF:                                3D EF:        24 %                                LV EDV:       189 ml                                LV ESV:       144 ml                                LV SV:        45 ml RIGHT VENTRICLE RV Basal diam:  3.60 cm RV Mid diam:    3.20 cm LEFT ATRIUM             Index        RIGHT ATRIUM          Index LA diam:        2.80 cm 2.00 cm/m  RA Area:     8.05 cm LA Vol (A2C):   43.6 ml 31.19 ml/m  RA Volume:   11.60 ml 8.30 ml/m LA Vol (A4C):   24.8 ml 17.74 ml/m LA Biplane Vol: 37.6 ml 26.90 ml/m  AORTIC VALVE AV Area (Vmax):    2.00 cm AV Area (Vmean):   1.73 cm AV Area (VTI):     2.21 cm AV Vmax:           130.00 cm/s AV Vmean:          90.633 cm/s AV VTI:            0.157 m AV Peak Grad:      6.8 mmHg AV Mean Grad:      4.0 mmHg LVOT Vmax:         75.00 cm/s LVOT Vmean:        45.400 cm/s LVOT VTI:          0.100 m LVOT/AV VTI ratio: 0.64 AI PHT:            469 msec  AORTA Ao Root diam: 2.70 cm MITRAL VALVE               TRICUSPID VALVE MV Area (PHT): 3.79 cm    TR Peak grad:   29.4 mmHg MV Decel Time: 200 msec    TR Vmax:        271.00 cm/s MV E velocity: 62.90 cm/s MV A velocity: 87.50 cm/s  SHUNTS MV E/A ratio:  0.72        Systemic VTI:  0.10 m                            Systemic Diam: 2.10 cm Deatrice Cage MD Electronically signed by Deatrice Cage MD Signature Date/Time:  08/13/2024/4:06:48 PM    Final    DG Chest Port 1 View Result Date: 08/13/2024 CLINICAL DATA:  Central line placement. EXAM: PORTABLE CHEST 1 VIEW COMPARISON:  Radiographs 08/13/2024.  CT 05/10/2024. FINDINGS: 1218 hours. Two views submitted. New right IJ central venous catheter projects to the mid SVC level. Endotracheal tube, enteric tube and left subclavian AICD leads appear unchanged. The heart size and mediastinal contours are stable. Mild atelectasis at both lung bases without evidence of confluent airspace disease, significant pleural effusion or pneumothorax. The bones appear unchanged. IMPRESSION: New right IJ central venous catheter projects to the mid SVC level. No evidence of pneumothorax. Electronically Signed   By: Elsie Perone M.D.   On: 08/13/2024 12:48   DG Abd 1 View Result Date: 08/13/2024 EXAM: 1 VIEW XRAY OF THE ABDOMEN 08/13/2024 07:07:00 AM COMPARISON: Abdomen film today at 4:53 am, abdomen film 06/26/2024. CLINICAL HISTORY: 360298 Feeding tube dysfunction 360298 Feeding tube dysfunction FINDINGS: LINES, TUBES AND DEVICES: NGT positioning is unchanged with the tip abutting the proximal fundal wall and the side port at the EG junction. Advancement is recommended to position the side port below the hiatus. The bladder is catheterized. BOWEL: Nonobstructive bowel gas pattern. There is mild to moderate retained stool in the cecum and rectum. Again noted 4 cm dilatation of a central abdominal post-surgical small bowel segment. No acute obstruction is suspected. SOFT TISSUES: Several bilateral kidney stones are again noted. No new pathologic calcification. Surgical changes in the right upper abdomen. BONES: No acute fracture. IMPRESSION: 1. NGT tip abutting the proximal fundal wall and side port at the EG junction; advancement is recommended to position  the side port below the hiatus. Electronically signed by: Francis Quam MD 08/13/2024 07:30 AM EST RP Workstation: [id number]   DG Abdomen  1 View Result Date: 08/13/2024 CLINICAL DATA:  OG tube placement. EXAM: ABDOMEN - 1 VIEW COMPARISON:  06/26/2024 FINDINGS: OG tube tip is in the proximal stomach with side port position in the region of the GE junction. IMPRESSION: OG tube tip is in the proximal stomach with side port position in the region of the GE junction. Tube could be advanced approximately 5 cm to ensure side port positioning below the GE junction. Repeat x-ray recommended after repositioning. Electronically Signed   By: Camellia Candle M.D.   On: 08/13/2024 05:10   DG Chest Portable 1 View Result Date: 08/13/2024 CLINICAL DATA:  Ventilator dependence.  Status post intubation. EXAM: PORTABLE CHEST 1 VIEW COMPARISON:  08/13/2024, earlier same day FINDINGS: Scratchendotracheal tube tip is approximately 4.3 cm above the base of the carina. The NG tube passes into the stomach although the distal tip position is not included on the film. Cardiopericardial silhouette is at upper limits of normal for size. Lungs are hyperexpanded patchy airspace disease seen in the right base on the previous study is less prominent on the current exam. Similar blunting of the left costophrenic angle, chronic. Telemetry leads overlie the chest. IMPRESSION: 1. Endotracheal tube tip is approximately 4.3 cm above the base of the carina. 2. Interval decrease in patchy airspace disease at the right base. Electronically Signed   By: Camellia Candle M.D.   On: 08/13/2024 05:10   DG Chest Portable 1 View Result Date: 08/13/2024 CLINICAL DATA:  Shortness of breath. EXAM: PORTABLE CHEST 1 VIEW COMPARISON:  07/25/2024 FINDINGS: Lungs are hyperexpanded. Cardiopericardial silhouette is at upper limits of normal for size. Stable blunting of the left costophrenic angle. Subtle patchy airspace disease noted at the right lung base. No pulmonary edema or focal lung consolidation. Left-sided permanent pacemaker/AICD again noted. Telemetry leads overlie the chest. IMPRESSION: Subtle  patchy airspace disease at the right lung base. Imaging features could be related to atelectasis or developing infiltrate. Electronically Signed   By: Camellia Candle M.D.   On: 08/13/2024 05:06     Assessment/Plan: Acute hypoxic and hypercapneic resp falilure due to COPD exacerbation- s/p extubation  AIDS Viral load undetectable in March 2025 and CD4 count was 66.  Patient was taking darunavir /cobicistat  plus rilpivirine .  Her last prescription refill was on 06/08/2024 HIV RNA and CD4 count has been sent Restart HAART Will enroll her in [facility] as she has no HIV provider here [facility] [phone]  ? Recent hospitalization for COPD exacerbation in December 2025 Also had influenza A and COVID then   Nonischemic cardiomyopathy Status post pacemaker/AICD   CKD   Chronic thrombocytopenia   Past history of PJP   Discussed the management with the patient and ID pharmacist and her nurse   "

## 2024-08-14 NOTE — Progress Notes (Signed)
 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 -ongoing   GOAL:   Patient will meet greater than or equal to 90% of their needs -not met   MONITOR:   PO intake, Supplement acceptance, Labs, Weight trends, Skin, I & O's  ASSESSMENT:   59 y/o female with h/o HIV, HTN, COPD, NICM, HLD, CHF, CKD III, TIA, cerebral aneurysm, AICD, MI, kidney stones and recent admission for COVID 19 and flu A and who is now admitted with CAP, COPD exacerbation and AKI.  Pt extubated today and initiated on a diet. RD will add supplements and vitamins to help pt meet her estimated needs. Pt is at high refeed risk. No BM yet. Per chart, pt is down ~5lbs from her UBW currently.    Medications reviewed and include: heparin , insulin , miralax , senokot, doxycycline , pepcid , zosyn   Labs reviewed: K 4.9 wnl, BUN 37(H), creat 2.76(H), P 5.5(H), Mg 2.4 wnl Wbc- 11.5(H) Cbgs-  94, 85, 94 x 24 hrs   UOP-   Diet Order:   Diet Order             Diet Heart Room service appropriate? Yes; Fluid consistency: Thin  Diet effective now                  EDUCATION NEEDS:   No education needs have been identified at this time  Skin:  Skin Assessment: Reviewed RN Assessment  Last BM:  PTA  Height:   Ht Readings from Last 1 Encounters:  07/25/24 5' 8 (1.727 m)    Weight:    Wt Readings from Last 1 Encounters:  08/14/24 43.2 kg    Ideal Body Weight:  63.6 kg  BMI:  Body mass index is 14.48 kg/m.  Estimated Nutritional Needs:   Kcal:  1600-1800kcal/day  Protein:  80-90g/day  Fluid:  1.4-1.6L/day  Augustin Shams MS, RD, LDN If unable to be reached, please send secure chat to RD inpatient available from 8:00a-4:00p daily

## 2024-08-14 NOTE — Progress Notes (Signed)
 PHARMACY CONSULT NOTE - ELECTROLYTES  Pharmacy Consult for Electrolyte Monitoring and Replacement   Recent Labs: Weight: 43.2 kg (95 lb 3.8 oz) Estimated Creatinine Clearance: 15.2 mL/min (A) (by C-G formula based on SCr of 2.76 mg/dL (H)). Potassium (mmol/L)  Date Value  08/14/2024 4.9   Magnesium  (mg/dL)  Date Value  98/79/7973 2.4   Calcium  (mg/dL)  Date Value  98/79/7973 7.9 (L)   Albumin (g/dL)  Date Value  98/80/7973 3.3 (L)   Phosphorus (mg/dL)  Date Value  98/79/7973 5.5 (H)   Sodium (mmol/L)  Date Value  08/14/2024 139   Corrected Ca: 9.1 mg/dL  Assessment  Claire Day is a 59 y.o. female presenting with headache, chills, body aches and generalized fatigue since 07/19/2024 . PMH significant for HIV, COPD not on home oxygen, cerebral aneurysm, systolic CHF/NICM s/p AICD, ongoing persistent active tobacco use disorder, HTN, history of TIA . Pharmacy has been consulted to monitor and replace electrolytes.  Goal of Therapy: Electrolytes WNL  Plan:  No supplementation needed at this time Check BMP, Mg, Phos with AM labs  Thank you for allowing pharmacy to be a part of this patient's care.  Belvie Macintosh, PharmD Candidate 08/14/2024 8:02 AM

## 2024-08-15 ENCOUNTER — Inpatient Hospital Stay

## 2024-08-15 DIAGNOSIS — E43 Unspecified severe protein-calorie malnutrition: Secondary | ICD-10-CM

## 2024-08-15 DIAGNOSIS — N186 End stage renal disease: Secondary | ICD-10-CM

## 2024-08-15 DIAGNOSIS — J9601 Acute respiratory failure with hypoxia: Secondary | ICD-10-CM | POA: Diagnosis not present

## 2024-08-15 DIAGNOSIS — N179 Acute kidney failure, unspecified: Secondary | ICD-10-CM

## 2024-08-15 DIAGNOSIS — B20 Human immunodeficiency virus [HIV] disease: Secondary | ICD-10-CM | POA: Diagnosis not present

## 2024-08-15 DIAGNOSIS — J441 Chronic obstructive pulmonary disease with (acute) exacerbation: Secondary | ICD-10-CM | POA: Diagnosis not present

## 2024-08-15 DIAGNOSIS — I5023 Acute on chronic systolic (congestive) heart failure: Secondary | ICD-10-CM | POA: Diagnosis not present

## 2024-08-15 DIAGNOSIS — I429 Cardiomyopathy, unspecified: Secondary | ICD-10-CM | POA: Diagnosis not present

## 2024-08-15 DIAGNOSIS — I420 Dilated cardiomyopathy: Secondary | ICD-10-CM

## 2024-08-15 DIAGNOSIS — J9621 Acute and chronic respiratory failure with hypoxia: Secondary | ICD-10-CM | POA: Diagnosis not present

## 2024-08-15 DIAGNOSIS — Z95810 Presence of automatic (implantable) cardiac defibrillator: Secondary | ICD-10-CM | POA: Diagnosis not present

## 2024-08-15 DIAGNOSIS — J9602 Acute respiratory failure with hypercapnia: Secondary | ICD-10-CM | POA: Diagnosis not present

## 2024-08-15 DIAGNOSIS — J9622 Acute and chronic respiratory failure with hypercapnia: Secondary | ICD-10-CM | POA: Diagnosis not present

## 2024-08-15 LAB — GLUCOSE, CAPILLARY
Glucose-Capillary: 90 mg/dL (ref 70–99)
Glucose-Capillary: 88 mg/dL (ref 70–99)
Glucose-Capillary: 112 mg/dL — ABNORMAL HIGH (ref 70–99)
Glucose-Capillary: 95 mg/dL (ref 70–99)
Glucose-Capillary: 100 mg/dL — ABNORMAL HIGH (ref 70–99)

## 2024-08-15 LAB — T-HELPER CELLS CD4/CD8 %
% CD 4 Pos. Lymph.: 7.3 % — ABNORMAL LOW (ref 30.8–58.5)
Absolute CD 4 Helper: 73 /uL — ABNORMAL LOW (ref 359–1519)
Basophils Absolute: 0 x10E3/uL (ref 0.0–0.2)
Basos: 0 %
CD3+CD4+ Cells/CD3+CD8+ Cells Bld: 0.11 — ABNORMAL LOW (ref 0.92–3.72)
CD3+CD8+ Cells # Bld: 669 /uL (ref 109–897)
CD3+CD8+ Cells NFr Bld: 66.9 % — ABNORMAL HIGH (ref 12.0–35.5)
EOS (ABSOLUTE): 0 x10E3/uL (ref 0.0–0.4)
Eos: 0 %
Hematocrit: 37.6 % (ref 34.0–46.6)
Hemoglobin: 12.5 g/dL (ref 11.1–15.9)
Immature Grans (Abs): 0 x10E3/uL (ref 0.0–0.1)
Immature Granulocytes: 0 %
Lymphocytes Absolute: 1 x10E3/uL (ref 0.7–3.1)
Lymphs: 8 %
MCH: 29.8 pg (ref 26.6–33.0)
MCHC: 33.2 g/dL (ref 31.5–35.7)
MCV: 90 fL (ref 79–97)
Monocytes Absolute: 1.8 x10E3/uL — ABNORMAL HIGH (ref 0.1–0.9)
Monocytes: 15 %
Neutrophils Absolute: 8.9 x10E3/uL — ABNORMAL HIGH (ref 1.4–7.0)
Neutrophils: 77 %
Platelets: 184 x10E3/uL (ref 150–450)
RBC: 4.19 x10E6/uL (ref 3.77–5.28)
RDW: 13.2 % (ref 11.7–15.4)
WBC: 11.7 x10E3/uL — ABNORMAL HIGH (ref 3.4–10.8)

## 2024-08-15 LAB — MAGNESIUM: Magnesium: 2.3 mg/dL (ref 1.7–2.4)

## 2024-08-15 LAB — URINALYSIS, ROUTINE W REFLEX MICROSCOPIC
Bacteria, UA: NONE SEEN
Bilirubin Urine: NEGATIVE
Glucose, UA: NEGATIVE mg/dL
Ketones, ur: NEGATIVE mg/dL
Nitrite: NEGATIVE
Protein, ur: NEGATIVE mg/dL
Specific Gravity, Urine: 1.006 (ref 1.005–1.030)
pH: 5 (ref 5.0–8.0)

## 2024-08-15 LAB — BASIC METABOLIC PANEL WITH GFR
Anion gap: 10 (ref 5–15)
BUN: 46 mg/dL — ABNORMAL HIGH (ref 6–20)
CO2: 21 mmol/L — ABNORMAL LOW (ref 22–32)
Calcium: 8.4 mg/dL — ABNORMAL LOW (ref 8.9–10.3)
Chloride: 109 mmol/L (ref 98–111)
Creatinine, Ser: 3.48 mg/dL — ABNORMAL HIGH (ref 0.44–1.00)
GFR, Estimated: 15 mL/min — ABNORMAL LOW
Glucose, Bld: 90 mg/dL (ref 70–99)
Potassium: 5 mmol/L (ref 3.5–5.1)
Sodium: 141 mmol/L (ref 135–145)

## 2024-08-15 LAB — CBC
HCT: 35.4 % — ABNORMAL LOW (ref 36.0–46.0)
Hemoglobin: 11.4 g/dL — ABNORMAL LOW (ref 12.0–15.0)
MCH: 29.2 pg (ref 26.0–34.0)
MCHC: 32.2 g/dL (ref 30.0–36.0)
MCV: 90.8 fL (ref 80.0–100.0)
Platelets: 141 K/uL — ABNORMAL LOW (ref 150–400)
RBC: 3.9 MIL/uL (ref 3.87–5.11)
RDW: 15 % (ref 11.5–15.5)
WBC: 9.2 K/uL (ref 4.0–10.5)
nRBC: 0 % (ref 0.0–0.2)

## 2024-08-15 LAB — PHOSPHORUS: Phosphorus: 5.8 mg/dL — ABNORMAL HIGH (ref 2.5–4.6)

## 2024-08-15 LAB — CREATININE, URINE, RANDOM: Creatinine, Urine: 22 mg/dL

## 2024-08-15 LAB — URINE CULTURE: Culture: >=100000 — AB

## 2024-08-15 LAB — HIV-1 RNA QUANT-NO REFLEX-BLD
HIV 1 RNA Quant: 2240 {copies}/mL
LOG10 HIV-1 RNA: 3.35 {Log_copies}/mL

## 2024-08-15 LAB — SODIUM, URINE, RANDOM: Sodium, Ur: 100 mmol/L

## 2024-08-15 LAB — TROPONIN T, HIGH SENSITIVITY: Troponin T High Sensitivity: 129 ng/L (ref 0–19)

## 2024-08-15 MED ORDER — LACTATED RINGERS IV SOLN: INTRAVENOUS | Status: AC

## 2024-08-15 MED ORDER — DAPSONE 100 MG PO TABS
100.0000 mg | ORAL_TABLET | Freq: Every day | ORAL | Status: DC
  Administered 2024-08-15 – 2024-08-20 (×6): 100 mg via ORAL
  Filled 2024-08-15 (×6): qty 1

## 2024-08-15 MED ORDER — ALUM & MAG HYDROXIDE-SIMETH 200-200-20 MG/5ML PO SUSP
15.0000 mL | ORAL | Status: DC | PRN
  Administered 2024-08-15: 15 mL via ORAL
  Filled 2024-08-15: qty 30

## 2024-08-15 MED ORDER — CALCIUM CARBONATE ANTACID 500 MG PO CHEW
1.0000 | CHEWABLE_TABLET | Freq: Three times a day (TID) | ORAL | Status: DC
  Administered 2024-08-16 – 2024-08-20 (×5): 200 mg via ORAL
  Filled 2024-08-15 (×7): qty 1

## 2024-08-15 MED ORDER — MIRTAZAPINE 15 MG PO TABS
15.0000 mg | ORAL_TABLET | Freq: Every day | ORAL | Status: DC
  Administered 2024-08-15 – 2024-08-20 (×6): 15 mg via ORAL
  Filled 2024-08-15 (×6): qty 1

## 2024-08-15 MED ORDER — MENTHOL 3 MG MT LOZG
1.0000 | LOZENGE | OROMUCOSAL | Status: DC | PRN
  Administered 2024-08-15 – 2024-08-16 (×2): 3 mg via ORAL
  Filled 2024-08-15: qty 9

## 2024-08-15 MED ORDER — POLYETHYLENE GLYCOL 3350 17 G PO PACK
17.0000 g | PACK | Freq: Every day | ORAL | Status: DC
  Administered 2024-08-15 – 2024-08-20 (×5): 17 g via ORAL
  Filled 2024-08-15 (×6): qty 1

## 2024-08-15 MED ORDER — SENNA 8.6 MG PO TABS
1.0000 | ORAL_TABLET | Freq: Two times a day (BID) | ORAL | Status: DC
  Administered 2024-08-15 – 2024-08-20 (×6): 8.6 mg via ORAL
  Filled 2024-08-15 (×9): qty 1

## 2024-08-15 MED ORDER — DOXYCYCLINE HYCLATE 100 MG PO TABS
100.0000 mg | ORAL_TABLET | Freq: Two times a day (BID) | ORAL | Status: AC
  Administered 2024-08-15 – 2024-08-17 (×6): 100 mg via ORAL
  Filled 2024-08-15 (×6): qty 1

## 2024-08-15 NOTE — Plan of Care (Signed)
  Problem: Coping: Goal: Ability to adjust to condition or change in health will improve Outcome: Progressing   Problem: Metabolic: Goal: Ability to maintain appropriate glucose levels will improve Outcome: Progressing   Problem: Clinical Measurements: Goal: Diagnostic test results will improve Outcome: Progressing Goal: Respiratory complications will improve Outcome: Progressing Goal: Cardiovascular complication will be avoided Outcome: Progressing

## 2024-08-15 NOTE — Evaluation (Signed)
 Occupational Therapy Evaluation Patient Details Name: Claire Day MRN: [id number] DOB: October 04, 1965 Today's Date: 08/15/2024   History of Present Illness   Pt is a 59 yo female that presented to ED for acute respiratory distress. Initially placed on bipap but intubated and admitted to ICU (extubated 1/20). PMH of HIV (low CD4), COPD (not on home O2), active tobacco use, NICM with HFrEF s/p AICD, HTN, CKD III, cerebral aneurysm, prior TIA, recent Influenza A and COVID-19 infection     Clinical Impressions Chart reviewed to date, pt greeted semi supine in bed, agreeable to OT evaluation. PTA pt reports she is MOD I-I in ADL/IADL, amb with no AD. She lives in an apartment with her daughter/grand children with approx 35 stairs to enter, no elevator access.. Pt reports she completes stairs 1x per day. Pt reports she amb with no AD. Pt presents with deficits in strength, endurance, activity tolerance, balance, affecting safe and optimal ADL completion. She is performing ADL/functional mobility below PLOF, will benefit from acute OT to address functional deficits and to facilitate return to PLOF. Pt is left in chair, all needs met, OT will follow.   Of note, pt on RA upon entering room, spo2 in 80s%, >90% on 3L via [place] throughout.      If plan is discharge home, recommend the following:   A little help with walking and/or transfers;A little help with bathing/dressing/bathroom;Assist for transportation;Assistance with cooking/housework     Functional Status Assessment   Patient has had a recent decline in their functional status and demonstrates the ability to make significant improvements in function in a reasonable and predictable amount of time.     Equipment Recommendations   Other (comment) (defer to next venue of care)     Recommendations for Other Services         Precautions/Restrictions   Precautions Precautions: Fall Recall of Precautions/Restrictions:  Intact Restrictions Weight Bearing Restrictions Per Provider Order: No     Mobility Bed Mobility Overal bed mobility: Needs Assistance Bed Mobility: Supine to Sit     Supine to sit: HOB elevated, Used rails, Contact guard     General bed mobility comments: increased time, PLB    Transfers Overall transfer level: Needs assistance Equipment used: Rolling walker (2 wheels) Transfers: Sit to/from Stand Sit to Stand: Contact guard assist, Min assist                  Balance Overall balance assessment: Needs assistance Sitting-balance support: Feet supported Sitting balance-Leahy Scale: Good     Standing balance support: Bilateral upper extremity supported, During functional activity, Reliant on assistive device for balance Standing balance-Leahy Scale: Fair                             ADL either performed or assessed with clinical judgement   ADL Overall ADL's : Needs assistance/impaired     Grooming: Standing;Contact guard assist Grooming Details (indicate cue type and reason): with RW standing at sink             Lower Body Dressing: Maximal assistance;Sitting/lateral leans   Toilet Transfer: Minimal assistance;Ambulation;BSC/3in1;Regular Toilet;Cueing for safety;Cueing for sequencing   Toileting- Clothing Manipulation and Hygiene: Supervision/safety;Sitting/lateral lean Toileting - Clothing Manipulation Details (indicate cue type and reason): continent urine on toilet     Functional mobility during ADLs: Rolling walker (2 wheels);Contact guard assist;Minimal assistance (approx 15' two attempts)       Vision Patient Visual  Report: No change from baseline       Perception         Praxis         Pertinent Vitals/Pain Pain Assessment Pain Assessment: Faces Faces Pain Scale: Hurts little more Pain Location: heartburn Pain Descriptors / Indicators: Discomfort Pain Intervention(s): Monitored during session, Repositioned (notified  nurse)     Extremity/Trunk Assessment Upper Extremity Assessment Upper Extremity Assessment: Generalized weakness   Lower Extremity Assessment Lower Extremity Assessment: Generalized weakness       Communication Communication Communication: Impaired Factors Affecting Communication: Reduced clarity of speech (dysphonic at times)   Cognition Arousal: Alert Behavior During Therapy: Flat affect Cognition: Cognition impaired           Executive functioning impairment (select all impairments): Problem solving                   Following commands: Intact       Cueing  General Comments   Cueing Techniques: Verbal cues      Exercises Other Exercises Other Exercises: edu re role of OT, role of rehab, discharge recommendations   Shoulder Instructions      Home Living Family/patient expects to be discharged to:: Private residence Living Arrangements: Children;Parent Available Help at Discharge: Family Type of Home: Apartment Home Access: Stairs to enter Secretary/administrator of Steps: 35- no elevator access, reports some landings she can rest at Entrance Stairs-Rails: Right;Left Home Layout: One level               Home Equipment: None          Prior Functioning/Environment Prior Level of Function : Independent/Modified Independent                    OT Problem List: Decreased strength;Decreased activity tolerance;Decreased knowledge of use of DME or AE;Cardiopulmonary status limiting activity;Impaired balance (sitting and/or standing)   OT Treatment/Interventions: Self-care/ADL training;Therapeutic exercise;Energy conservation;DME and/or AE instruction;Therapeutic activities;Modalities;Patient/family education;Balance training      OT Goals(Current goals can be found in the care plan section)   Acute Rehab OT Goals Patient Stated Goal: go home OT Goal Formulation: With patient Time For Goal Achievement: 08/29/24 Potential to Achieve  Goals: Fair ADL Goals Pt Will Perform Grooming: with modified independence;sitting;standing Pt Will Perform Lower Body Dressing: with modified independence;sitting/lateral leans;sit to/from stand Pt Will Transfer to Toilet: with modified independence;ambulating Pt Will Perform Toileting - Clothing Manipulation and hygiene: with modified independence;sitting/lateral leans;sit to/from stand   OT Frequency:  Min 3X/week    Co-evaluation PT/OT/SLP Co-Evaluation/Treatment: Yes Reason for Co-Treatment:  (tolerance)          AM-PAC OT 6 Clicks Daily Activity     Outcome Measure Help from another person eating meals?: None Help from another person taking care of personal grooming?: None Help from another person toileting, which includes using toliet, bedpan, or urinal?: None Help from another person bathing (including washing, rinsing, drying)?: A Little Help from another person to put on and taking off regular upper body clothing?: None Help from another person to put on and taking off regular lower body clothing?: A Lot 6 Click Score: 21   End of Session Equipment Utilized During Treatment: Rolling walker (2 wheels) Nurse Communication: Mobility status;Other (comment) (reported pain)  Activity Tolerance: Patient tolerated treatment well Patient left: in chair;with call bell/phone within reach;with chair alarm set  OT Visit Diagnosis: Other abnormalities of gait and mobility (R26.89);Muscle weakness (generalized) (M62.81)  Time: 8575-8554 OT Time Calculation (min): 21 min Charges:  OT General Charges $OT Visit: 1 Visit OT Evaluation $OT Eval Moderate Complexity: 1 Mod  Therisa Sheffield, OTD OTR/L  08/15/24, 4:24 PM

## 2024-08-15 NOTE — Evaluation (Signed)
 Physical Therapy Evaluation Patient Details Name: Claire Day MRN: [id number] DOB: 06-24-1966 Today's Date: 08/15/2024  History of Present Illness  Pt is a 59 yo female that presented to ED for acute respiratory distress. Initially placed on bipap but intubated and admitted to ICU (extubated 1/20). PMH of HIV (low CD4), COPD (not on home O2), active tobacco use, NICM with HFrEF s/p AICD, HTN, CKD III, cerebral aneurysm, prior TIA, recent Influenza A and COVID-19 infection.   Clinical Impression  Patient was alert, oriented, reported at baseline she is independent. The patient was very limited by fatigue and endurance. Cga-minA for mobility, noticeable narrow base of support, knee buckling, and one LOB that required minA to recover.  Overall the patient demonstrated deficits (see PT Problem List) that impede the patient's functional abilities, safety, and mobility and would benefit from skilled PT intervention. Recommend intensive skilled PT intervention to maximize outcomes and return pt to PLOF, pt is motivated.         If plan is discharge home, recommend the following: A little help with walking and/or transfers;A little help with bathing/dressing/bathroom;Assistance with cooking/housework;Assist for transportation;Help with stairs or ramp for entrance   Can travel by private vehicle        Equipment Recommendations Rolling walker (2 wheels);BSC/3in1  Recommendations for Other Services       Functional Status Assessment Patient has had a recent decline in their functional status and demonstrates the ability to make significant improvements in function in a reasonable and predictable amount of time.     Precautions / Restrictions Precautions Precautions: Fall Recall of Precautions/Restrictions: Intact Restrictions Weight Bearing Restrictions Per Provider Order: No      Mobility  Bed Mobility Overal bed mobility: Needs Assistance Bed Mobility: Supine to Sit     Supine  to sit: HOB elevated, Used rails, Contact guard     General bed mobility comments: increased time, PLB    Transfers Overall transfer level: Needs assistance Equipment used: Rolling walker (2 wheels) Transfers: Sit to/from Stand Sit to Stand: Contact guard assist, Min assist           General transfer comment: stabilization of RW needed, education on hand placement with poor carryover    Ambulation/Gait Ambulation/Gait assistance: Min assist, Contact guard assist Gait Distance (Feet): 25 Feet Assistive device: Rolling walker (2 wheels)         General Gait Details: noticable fatigue noted, mild knee buckling, 1 LOB requiring minA to correct  Stairs            Wheelchair Mobility     Tilt Bed    Modified Rankin (Stroke Patients Only)       Balance Overall balance assessment: Needs assistance Sitting-balance support: Feet supported Sitting balance-Leahy Scale: Good     Standing balance support: Bilateral upper extremity supported, During functional activity, Reliant on assistive device for balance Standing balance-Leahy Scale: Poor                               Pertinent Vitals/Pain Pain Assessment Pain Assessment: Faces Faces Pain Scale: Hurts little more Pain Location: heartburn Pain Descriptors / Indicators: Discomfort Pain Intervention(s): Limited activity within patient's tolerance, Monitored during session, Repositioned    Home Living Family/patient expects to be discharged to:: Private residence Living Arrangements: Children;Parent Available Help at Discharge: Family Type of Home: Apartment Home Access: Stairs to enter Entrance Stairs-Rails: Doctor, General Practice of Steps: 35- no elevator access,  reports some landings she can rest at   Home Layout: One level Home Equipment: None      Prior Function Prior Level of Function : Independent/Modified Independent                     Extremity/Trunk  Assessment   Upper Extremity Assessment Upper Extremity Assessment: Generalized weakness    Lower Extremity Assessment Lower Extremity Assessment: Generalized weakness       Communication   Communication Communication: Impaired Factors Affecting Communication: Reduced clarity of speech (dysphonic)    Cognition Arousal: Alert Behavior During Therapy: Flat affect                             Following commands: Intact       Cueing Cueing Techniques: Verbal cues     General Comments      Exercises     Assessment/Plan    PT Assessment Patient needs continued PT services  PT Problem List Decreased strength;Decreased activity tolerance;Decreased balance;Decreased mobility;Decreased knowledge of precautions;Decreased knowledge of use of DME       PT Treatment Interventions DME instruction;Balance training;Gait training;Neuromuscular re-education;Stair training;Functional mobility training;Patient/family education;Therapeutic activities;Therapeutic exercise    PT Goals (Current goals can be found in the Care Plan section)  Acute Rehab PT Goals Patient Stated Goal: to go home PT Goal Formulation: With patient Time For Goal Achievement: 08/29/24 Potential to Achieve Goals: Good    Frequency Min 3X/week     Co-evaluation PT/OT/SLP Co-Evaluation/Treatment: Yes Reason for Co-Treatment:  (tolerance) PT goals addressed during session: Mobility/safety with mobility;Balance;Proper use of DME;Strengthening/ROM OT goals addressed during session: ADL's and self-care       AM-PAC PT 6 Clicks Mobility  Outcome Measure Help needed turning from your back to your side while in a flat bed without using bedrails?: A Little Help needed moving from lying on your back to sitting on the side of a flat bed without using bedrails?: A Little Help needed moving to and from a bed to a chair (including a wheelchair)?: A Little Help needed standing up from a chair using your  arms (e.g., wheelchair or bedside chair)?: A Little Help needed to walk in hospital room?: A Little Help needed climbing 3-5 steps with a railing? : A Little 6 Click Score: 18    End of Session Equipment Utilized During Treatment: Gait belt Activity Tolerance: Patient tolerated treatment well Patient left: in chair;with call bell/phone within reach;with chair alarm set Nurse Communication: Mobility status PT Visit Diagnosis: Other abnormalities of gait and mobility (R26.89);Difficulty in walking, not elsewhere classified (R26.2);Muscle weakness (generalized) (M62.81)    Time: 8573-8553 PT Time Calculation (min) (ACUTE ONLY): 20 min   Charges:   PT Evaluation $PT Eval Low Complexity: 1 Low PT Treatments $Therapeutic Activity: 8-22 mins PT General Charges $$ ACUTE PT VISIT: 1 Visit         Doyal Shams PT, DPT 4:36 PM,08/15/24

## 2024-08-15 NOTE — Progress Notes (Signed)
 " PROGRESS NOTE    Claire Day  FMW:[id number] DOB: 1966-06-10 DOA: 08/13/2024 PCP: Meade Bigness, MD  Subjective: No acute events overnight. Seen and examined at bedside with brother present. Reports feeling better with improvement in shortness of breath and cough. Denies chest pain, nausea, vomiting, diarrhea, constipation. Reports feeling weak and tired. Hasn't eaten much since extubation yesterday afternoon.    Hospital Course:   59 year old female with PMH significant for HIV (low CD4), COPD (not on home O?), active tobacco use, NICM with HFrEF s/p AICD, HTN, CKD III, cerebral aneurysm, prior TIA, recent Influenza A and COVID-19 infection presenting to the ED with complaints of acute respiratory distress.   On chart review patient with recent admission (07/25/24-07/27/24) for acute respiratory failure secondary to AECOPD, Influenza A, and COVID-19, treated with steroids, antivirals, antibiotics, and BiPAP, discharged on room air. She re-presented today via EMS with severe shortness of breath. On EMS arrival, she was tripoding and speaking in short sentences and was placed on BiPAP, receiving IV magnesium  and Solu-Medrol  125 mg en route. Patient treated DuoNeb, LR 500 mL bolus, antibiotics (cefepime  2 g IV, vanco 1 g IV, doxy 100  mg IV). Propofol  infusion for sedation. Intubated for mechanical ventilation. Admitted to ICU.   Patient treated with IV antibiotics, mechanical ventilation and vasopressors. Extubated and weaned off pressors and transferred to medicine floor.   Assessment and Plan:  Acute Hypoxic Hypercapnic Respiratory Failure iso  #AECOPD #CAP Hx of COPD; smoker  - status post mechanical ventilation. Extubated 1/20 - currently on room air - cont duonebs - hold off on steroids for now -Nicotine  replacement therapy   Suspected Pneumonia  CXR: hyperinflation with subtle RLL patchy airspace disease Initial interventions/workup included: 2 L of NS/LR & cefepime , vancomycin ,  doxycycline  - covid/influenza/RSV negative - resp viral panel negative - MRSA screen negative - strep urine Ag negative - resp Cx with few GPCs, final read pending - Bcx 1/19 showed NGTD, final read pending -Cont zosyn  and doxycycline , duration TBD    #AKI on CKD stage III Cr 3.48 < 1.37, baseline Cr 1-1.3 - etiology unclear, likely in the setting of severe sepsis/septic shock requiring vasopressors - will start gentle IV fluids now - will check UA, Ulytes, bladder scan, renal US  - renal dose meds -Avoid nephrotoxic agents -Follow I/Os, renal function   #NICM/HFrEF s/p AICD  #s/p AICD #HTN #HLD Last Echo 01/2023 recovered EF >65%,  -Euvolemic on exam.  -BNP: 5944 -Chest XR (1/18): No pulmonary edema -2Decho 1/19 showed LVEF 25-30%, global hypokinesis, mild LVH, grade 1 diastolic dysfunction -Hold Home GDMT for now -Hold Diuretics for now - gentle IV fluids as elsewhere -Strict I/Os -Low threshold for cards consult   #Hyperglycemia Glucose 248 Likely stress and steroid related no hx of DM -Check A1c -CBG's q4hrs  -SSI -Target CBG readings 140 to 180 -Follow hypo/hyperglycemic protocol    #HIV/AIDS # PCP Pneumonia -Check Viral load , CD4 count -Per Last ID notes on prezcobix  and rilpavirine   - cont home darunavir -cobicistat  and rilpivirine  - resume home dapsone  -ID following  DVT prophylaxis: heparin  injection 5,000 Units Start: 08/13/24 0600  SQ Heparin    Code Status: Full Code Family Communication: updated brother at bedside Disposition Plan: TBD Reason for continuing need for hospitalization: severity of illness  Objective: Vitals:   08/15/24 0400 08/15/24 0500 08/15/24 0710 08/15/24 0755  BP: 127/78  133/86 129/80  Pulse: (!) 105  (!) 105 (!) 109  Resp: 15  20 16   Temp:   ROLLEN)  97.5 F (36.4 C) 98 F (36.7 C)  TempSrc:   Oral Oral  SpO2: 90%  98% 96%  Weight:  44.6 kg      Intake/Output Summary (Last 24 hours) at 08/15/2024 1202 Last data filed  at 08/15/2024 0556 Gross per 24 hour  Intake 2578.95 ml  Output 800 ml  Net 1778.95 ml   Filed Weights   08/13/24 0443 08/14/24 0500 08/15/24 0500  Weight: 37 kg 43.2 kg 44.6 kg    Examination:  Physical Exam Vitals and nursing note reviewed.  Constitutional:      General: She is not in acute distress.    Appearance: She is ill-appearing.     Comments: Weak, frail  HENT:     Head: Normocephalic and atraumatic.  Cardiovascular:     Rate and Rhythm: Normal rate and regular rhythm.     Pulses: Normal pulses.     Heart sounds: Normal heart sounds.  Pulmonary:     Effort: Pulmonary effort is normal. No respiratory distress.     Breath sounds: Normal breath sounds. No wheezing.  Abdominal:     General: Bowel sounds are normal. There is no distension.     Palpations: Abdomen is soft.     Tenderness: There is no abdominal tenderness.  Neurological:     Mental Status: She is alert. Mental status is at baseline.     Data Reviewed: I have personally reviewed following labs and imaging studies  CBC: Recent Labs  Lab 08/13/24 0411 08/14/24 0434 08/15/24 0346  WBC 9.5 11.5* 9.2  NEUTROABS 4.2  --   --   HGB 13.2 12.2 11.4*  HCT 42.2 38.2 35.4*  MCV 93.4 90.5 90.8  PLT 215 187 141*   Basic Metabolic Panel: Recent Labs  Lab 08/13/24 0411 08/13/24 1714 08/14/24 0434 08/15/24 0346  NA 141  --  139 141  K 4.1 4.7 4.9 5.0  CL 109  --  108 109  CO2 18*  --  20* 21*  GLUCOSE 248*  --  101* 90  BUN 21*  --  37* 46*  CREATININE 1.37*  --  2.76* 3.48*  CALCIUM  8.5*  --  7.9* 8.4*  MG  --  2.4 2.4 2.3  PHOS  --   --  5.5* 5.8*   GFR: Estimated Creatinine Clearance: 12.4 mL/min (A) (by C-G formula based on SCr of 3.48 mg/dL (H)). Liver Function Tests: Recent Labs  Lab 08/13/24 0411  AST 36  ALT 17  ALKPHOS 129*  BILITOT 0.2  PROT 5.9*  ALBUMIN 3.3*   No results for input(s): LIPASE, AMYLASE in the last 168 hours. No results for input(s): AMMONIA in the  last 168 hours. Coagulation Profile: Recent Labs  Lab 08/13/24 0411  INR 1.1   Cardiac Enzymes: No results for input(s): CKTOTAL, CKMB, CKMBINDEX, TROPONINI in the last 168 hours. ProBNP, BNP (last 5 results) Recent Labs    12/02/23 0907 07/25/24 1516 08/13/24 0509  PROBNP  --  3,282.0* 5,944.0*  BNP 105.0*  --   --    HbA1C: Recent Labs    08/13/24 0411  HGBA1C 5.6   CBG: Recent Labs  Lab 08/14/24 1941 08/14/24 2320 08/15/24 0329 08/15/24 0752 08/15/24 1156  GLUCAP 85 91 90 88 112*   Lipid Profile: No results for input(s): CHOL, HDL, LDLCALC, TRIG, CHOLHDL, LDLDIRECT in the last 72 hours. Thyroid Function Tests: No results for input(s): TSH, T4TOTAL, FREET4, T3FREE, THYROIDAB in the last 72 hours. Anemia Panel: No results  for input(s): VITAMINB12, FOLATE, FERRITIN, TIBC, IRON, RETICCTPCT in the last 72 hours. Sepsis Labs: Recent Labs  Lab 08/13/24 0411 08/13/24 0902 08/13/24 1108 08/13/24 2252  LATICACIDVEN 5.0* 1.4 2.8* 2.7*    Recent Results (from the past 240 hours)  Resp panel by RT-PCR (RSV, Flu A&B, Covid) Anterior Nasal Swab     Status: None   Collection Time: 08/13/24  4:11 AM   Specimen: Anterior Nasal Swab  Result Value Ref Range Status   SARS Coronavirus 2 by RT PCR NEGATIVE NEGATIVE Final    Comment: (NOTE) SARS-CoV-2 target nucleic acids are NOT DETECTED.  The SARS-CoV-2 RNA is generally detectable in upper respiratory specimens during the acute phase of infection. The lowest concentration of SARS-CoV-2 viral copies this assay can detect is 138 copies/mL. A negative result does not preclude SARS-Cov-2 infection and should not be used as the sole basis for treatment or other patient management decisions. A negative result may occur with  improper specimen collection/handling, submission of specimen other than nasopharyngeal swab, presence of viral mutation(s) within the areas targeted by this  assay, and inadequate number of viral copies(<138 copies/mL). A negative result must be combined with clinical observations, patient history, and epidemiological information. The expected result is Negative.  Fact Sheet for Patients:  bloggercourse.com  Fact Sheet for Healthcare Providers:  seriousbroker.it  This test is no t yet approved or cleared by the [place]  FDA and  has been authorized for detection and/or diagnosis of SARS-CoV-2 by FDA under an Emergency Use Authorization (EUA). This EUA will remain  in effect (meaning this test can be used) for the duration of the COVID-19 declaration under Section 564(b)(1) of the Act, 21 U.S.C.section 360bbb-3(b)(1), unless the authorization is terminated  or revoked sooner.       Influenza A by PCR NEGATIVE NEGATIVE Final   Influenza B by PCR NEGATIVE NEGATIVE Final    Comment: (NOTE) The Xpert Xpress SARS-CoV-2/FLU/RSV plus assay is intended as an aid in the diagnosis of influenza from Nasopharyngeal swab specimens and should not be used as a sole basis for treatment. Nasal washings and aspirates are unacceptable for Xpert Xpress SARS-CoV-2/FLU/RSV testing.  Fact Sheet for Patients: bloggercourse.com  Fact Sheet for Healthcare Providers: seriousbroker.it  This test is not yet approved or cleared by the [place]  FDA and has been authorized for detection and/or diagnosis of SARS-CoV-2 by FDA under an Emergency Use Authorization (EUA). This EUA will remain in effect (meaning this test can be used) for the duration of the COVID-19 declaration under Section 564(b)(1) of the Act, 21 U.S.C. section 360bbb-3(b)(1), unless the authorization is terminated or revoked.     Resp Syncytial Virus by PCR NEGATIVE NEGATIVE Final    Comment: (NOTE) Fact Sheet for Patients: bloggercourse.com  Fact Sheet for  Healthcare Providers: seriousbroker.it  This test is not yet approved or cleared by the [place]  FDA and has been authorized for detection and/or diagnosis of SARS-CoV-2 by FDA under an Emergency Use Authorization (EUA). This EUA will remain in effect (meaning this test can be used) for the duration of the COVID-19 declaration under Section 564(b)(1) of the Act, 21 U.S.C. section 360bbb-3(b)(1), unless the authorization is terminated or revoked.  Performed at [facility], [address] [place], [place] [address]   Blood Culture (routine x 2)     Status: None (Preliminary result)   Collection Time: 08/13/24  4:11 AM   Specimen: BLOOD  Result Value Ref Range Status   Specimen Description BLOOD  BLOOD LEFT FOREARM  Final   Special Requests   Final    BOTTLES DRAWN AEROBIC AND ANAEROBIC Blood Culture adequate volume   Culture   Final    NO GROWTH 2 DAYS Performed at [facility], [address] [place], [place] [address]    Report Status PENDING  Incomplete  Blood Culture (routine x 2)     Status: None (Preliminary result)   Collection Time: 08/13/24  4:11 AM   Specimen: BLOOD  Result Value Ref Range Status   Specimen Description BLOOD LEFT ANTECUBITAL  Final   Special Requests   Final    BOTTLES DRAWN AEROBIC AND ANAEROBIC Blood Culture adequate volume   Culture   Final    NO GROWTH 2 DAYS Performed at [facility], [address] [place], [place] [address]    Report Status PENDING  Incomplete  Urine Culture     Status: Abnormal   Collection Time: 08/13/24  4:11 AM   Specimen: Urine, Random  Result Value Ref Range Status   Specimen Description   Final    URINE, RANDOM Performed at [facility], [address] [place], [place] [address]    Special Requests   Final    URINE, CLEAN CATCH Performed at [facility], [address], [place], [place] [address]    Culture >=100,000 COLONIES/mL  ESCHERICHIA COLI (A)  Final   Report Status 08/15/2024 FINAL  Final   Organism ID, Bacteria ESCHERICHIA COLI (A)  Final      Susceptibility   Escherichia coli - MIC*    AMPICILLIN <=2 SENSITIVE Sensitive     CEFAZOLIN (URINE) Value in next row Sensitive      4 SENSITIVEThis is a modified FDA-approved test that has been validated and its performance characteristics determined by the reporting laboratory.  This laboratory is certified under the Clinical Laboratory Improvement Amendments CLIA as qualified to perform high complexity clinical laboratory testing.    CEFEPIME  Value in next row Sensitive      4 SENSITIVEThis is a modified FDA-approved test that has been validated and its performance characteristics determined by the reporting laboratory.  This laboratory is certified under the Clinical Laboratory Improvement Amendments CLIA as qualified to perform high complexity clinical laboratory testing.    ERTAPENEM Value in next row Sensitive      4 SENSITIVEThis is a modified FDA-approved test that has been validated and its performance characteristics determined by the reporting laboratory.  This laboratory is certified under the Clinical Laboratory Improvement Amendments CLIA as qualified to perform high complexity clinical laboratory testing.    CEFTRIAXONE  Value in next row Sensitive      4 SENSITIVEThis is a modified FDA-approved test that has been validated and its performance characteristics determined by the reporting laboratory.  This laboratory is certified under the Clinical Laboratory Improvement Amendments CLIA as qualified to perform high complexity clinical laboratory testing.    CIPROFLOXACIN Value in next row Sensitive      4 SENSITIVEThis is a modified FDA-approved test that has been validated and its performance characteristics determined by the reporting laboratory.  This laboratory is certified under the Clinical Laboratory Improvement Amendments CLIA as qualified to perform high  complexity clinical laboratory testing.    GENTAMICIN Value in next row Sensitive      4 SENSITIVEThis is a modified FDA-approved test that has been validated and its performance characteristics determined by the reporting laboratory.  This laboratory is certified under the Clinical Laboratory Improvement  Amendments CLIA as qualified to perform high complexity clinical laboratory testing.    NITROFURANTOIN Value in next row Sensitive      4 SENSITIVEThis is a modified FDA-approved test that has been validated and its performance characteristics determined by the reporting laboratory.  This laboratory is certified under the Clinical Laboratory Improvement Amendments CLIA as qualified to perform high complexity clinical laboratory testing.    TRIMETH/SULFA Value in next row Resistant      4 SENSITIVEThis is a modified FDA-approved test that has been validated and its performance characteristics determined by the reporting laboratory.  This laboratory is certified under the Clinical Laboratory Improvement Amendments CLIA as qualified to perform high complexity clinical laboratory testing.    AMPICILLIN/SULBACTAM Value in next row Sensitive      4 SENSITIVEThis is a modified FDA-approved test that has been validated and its performance characteristics determined by the reporting laboratory.  This laboratory is certified under the Clinical Laboratory Improvement Amendments CLIA as qualified to perform high complexity clinical laboratory testing.    PIP/TAZO Value in next row Sensitive      <=4 SENSITIVEThis is a modified FDA-approved test that has been validated and its performance characteristics determined by the reporting laboratory.  This laboratory is certified under the Clinical Laboratory Improvement Amendments CLIA as qualified to perform high complexity clinical laboratory testing.    MEROPENEM Value in next row Sensitive      <=4 SENSITIVEThis is a modified FDA-approved test that has been validated  and its performance characteristics determined by the reporting laboratory.  This laboratory is certified under the Clinical Laboratory Improvement Amendments CLIA as qualified to perform high complexity clinical laboratory testing.    * >=100,000 COLONIES/mL ESCHERICHIA COLI  MRSA Next Gen by PCR, Nasal     Status: None   Collection Time: 08/13/24  6:30 AM   Specimen: Nasal Mucosa; Nasal Swab  Result Value Ref Range Status   MRSA by PCR Next Gen NOT DETECTED NOT DETECTED Final    Comment: (NOTE) The GeneXpert MRSA Assay (FDA approved for NASAL specimens only), is one component of a comprehensive MRSA colonization surveillance program. It is not intended to diagnose MRSA infection nor to guide or monitor treatment for MRSA infections. Test performance is not FDA approved in patients less than 23 years old. Performed at [facility], [address] [place], [place] [address]   Respiratory (~20 pathogens) panel by PCR     Status: None   Collection Time: 08/13/24  6:43 AM   Specimen: Nasopharyngeal Swab; Respiratory  Result Value Ref Range Status   Adenovirus NOT DETECTED NOT DETECTED Final   Coronavirus 229E NOT DETECTED NOT DETECTED Final    Comment: (NOTE) The Coronavirus on the Respiratory Panel, DOES NOT test for the novel  Coronavirus (2019 nCoV)    Coronavirus HKU1 NOT DETECTED NOT DETECTED Final   Coronavirus NL63 NOT DETECTED NOT DETECTED Final   Coronavirus OC43 NOT DETECTED NOT DETECTED Final   Metapneumovirus NOT DETECTED NOT DETECTED Final   Rhinovirus / Enterovirus NOT DETECTED NOT DETECTED Final   Influenza A NOT DETECTED NOT DETECTED Final   Influenza B NOT DETECTED NOT DETECTED Final   Parainfluenza Virus 1 NOT DETECTED NOT DETECTED Final   Parainfluenza Virus 2 NOT DETECTED NOT DETECTED Final   Parainfluenza Virus 3 NOT DETECTED NOT DETECTED Final   Parainfluenza Virus 4 NOT DETECTED NOT DETECTED Final   Respiratory Syncytial Virus NOT DETECTED NOT  DETECTED Final   Bordetella pertussis NOT  DETECTED NOT DETECTED Final   Bordetella Parapertussis NOT DETECTED NOT DETECTED Final   Chlamydophila pneumoniae NOT DETECTED NOT DETECTED Final   Mycoplasma pneumoniae NOT DETECTED NOT DETECTED Final    Comment: Performed at [facility], [address], [place], [place] [address]  Culture, Respiratory w Gram Stain     Status: None (Preliminary result)   Collection Time: 08/13/24  3:54 PM   Specimen: Tracheal Aspirate; Respiratory  Result Value Ref Range Status   Specimen Description   Final    TRACHEAL ASPIRATE Performed at [facility], [address] [place], [place] [address]    Special Requests   Final    NONE Performed at [facility], [address] [place], [place] [address]    Gram Stain   Final    FEW WBC PRESENT, PREDOMINANTLY PMN FEW GRAM POSITIVE COCCI    Culture   Final    CULTURE REINCUBATED FOR BETTER GROWTH Performed at [facility], [address], [place], [place] [address]    Report Status PENDING  Incomplete     Radiology Studies: US  RENAL Result Date: 08/15/2024 CLINICAL DATA:  Acute renal insufficiency. EXAM: RENAL / URINARY TRACT ULTRASOUND COMPLETE COMPARISON:  CT 04/05/2024 FINDINGS: Right Kidney: Renal measurements: 9.2 x 5.5 x 4.2 cm = volume: 111 mL. Mild increased cortical echogenicity. Cortical thickness is normal. Mild fullness of the right intrarenal collecting system. 1.2 cm shadowing stone over the mid pole. Trace free fluid adjacent the upper pole. Left Kidney: Renal measurements: 9.1 x 5.2 x 4.1 cm = volume: 93 mL. Mild increased cortical echogenicity. Cortical thickness is normal. No significant hydronephrosis. 7 mm stone over the lower pole. Bladder: Appears normal for degree of bladder distention. Bilateral ureteral jets visualized. Other: None. IMPRESSION: 1. Normal size kidneys with mild increased cortical echogenicity as can be seen with medical renal disease. 2.  Bilateral nephrolithiasis. Mild fullness of the right intrarenal collecting system. Electronically Signed   By: Toribio Agreste M.D.   On: 08/15/2024 09:42   ECHOCARDIOGRAM COMPLETE Result Date: 08/13/2024    ECHOCARDIOGRAM REPORT   Patient Name:   Claire Day Date of Exam: 08/13/2024 Medical Rec #:  [id number]        Height:       68.1 in Accession #:    [id number]       Weight:       81.6 lb Date of Birth:  1966-03-15        BSA:          1.398 m Patient Age:    58 years         BP:           87/49 mmHg Patient Gender: F                HR:           92 bpm. Exam Location:  [facility] Procedure: 2D Echo, 3D Echo, Color Doppler, Cardiac Doppler and Strain Analysis            (Both Spectral and Color Flow Doppler were utilized during            procedure). Indications:     CHF-acute diastolic I50.31  History:         Patient has no prior history of Echocardiogram examinations.                  COPD, Arrythmias:LBBB; Risk Factors:Hypertension.  Sonographer:  Christopher Furnace Referring Phys:  [id number] ALMARIE BAKE OUMA Diagnosing Phys: Deatrice Cage MD  Sonographer Comments: Echo performed with patient supine and on artificial respirator. Global longitudinal strain was attempted. IMPRESSIONS  1. Left ventricular ejection fraction, by estimation, is 25 to 30%. Left ventricular ejection fraction by 3D volume is 24 %. The left ventricle has severely decreased function. The left ventricle demonstrates global hypokinesis. There is mild left ventricular hypertrophy. Left ventricular diastolic parameters are consistent with Grade I diastolic dysfunction (impaired relaxation). The average left ventricular global longitudinal strain is -6.1 %. The global longitudinal strain is abnormal.  2. Right ventricular systolic function is normal. The right ventricular size is normal. There is normal pulmonary artery systolic pressure.  3. The mitral valve is normal in structure. No evidence of mitral valve regurgitation. No evidence of  mitral stenosis.  4. The aortic valve is normal in structure. Aortic valve regurgitation is moderate. Aortic valve sclerosis/calcification is present, without any evidence of aortic stenosis. FINDINGS  Left Ventricle: Left ventricular ejection fraction, by estimation, is 25 to 30%. Left ventricular ejection fraction by 3D volume is 24 %. The left ventricle has severely decreased function. The left ventricle demonstrates global hypokinesis. The average  left ventricular global longitudinal strain is -6.1 %. Strain was performed and the global longitudinal strain is abnormal. The left ventricular internal cavity size was normal in size. There is mild left ventricular hypertrophy. Left ventricular diastolic parameters are consistent with Grade I diastolic dysfunction (impaired relaxation). Right Ventricle: The right ventricular size is normal. No increase in right ventricular wall thickness. Right ventricular systolic function is normal. There is normal pulmonary artery systolic pressure. The tricuspid regurgitant velocity is 2.71 m/s, and  with an assumed right atrial pressure of 5 mmHg, the estimated right ventricular systolic pressure is 34.4 mmHg. Left Atrium: Left atrial size was normal in size. Right Atrium: Right atrial size was normal in size. Pericardium: Trivial pericardial effusion is present. Mitral Valve: The mitral valve is normal in structure. No evidence of mitral valve regurgitation. No evidence of mitral valve stenosis. Tricuspid Valve: The tricuspid valve is normal in structure. Tricuspid valve regurgitation is mild . No evidence of tricuspid stenosis. Aortic Valve: The aortic valve is normal in structure. Aortic valve regurgitation is moderate. Aortic regurgitation PHT measures 469 msec. Aortic valve sclerosis/calcification is present, without any evidence of aortic stenosis. Aortic valve mean gradient measures 4.0 mmHg. Aortic valve peak gradient measures 6.8 mmHg. Aortic valve area, by VTI  measures 2.21 cm. Pulmonic Valve: The pulmonic valve was normal in structure. Pulmonic valve regurgitation is trivial. No evidence of pulmonic stenosis. Aorta: The aortic root is normal in size and structure. Venous: The inferior vena cava was not well visualized. IAS/Shunts: No atrial level shunt detected by color flow Doppler. Additional Comments: A device lead is visualized.  LEFT VENTRICLE PLAX 2D LVIDd:         4.70 cm         Diastology LVIDs:         4.00 cm         LV e' medial:    6.20 cm/s LV PW:         1.30 cm         LV E/e' medial:  10.1 LV IVS:        1.30 cm         LV e' lateral:   5.00 cm/s LVOT diam:     2.10 cm  LV E/e' lateral: 12.6 LV SV:         35 LV SV Index:   25              2D Longitudinal LVOT Area:     3.46 cm        Strain LV IVRT:       114 msec        2D Strain GLS   -6.1 %                                Avg:  LV Volumes (MOD)               3D Volume EF LV vol d, MOD    127.0 ml      LV 3D EF:    Left A2C:                                        ventricul LV vol d, MOD    115.0 ml                   ar A4C:                                        ejection LV vol s, MOD    96.3 ml                    fraction A2C:                                        by 3D LV vol s, MOD    85.6 ml                    volume is A4C:                                        24 %. LV SV MOD A2C:   30.7 ml LV SV MOD A4C:   115.0 ml LV SV MOD BP:    30.1 ml       3D Volume EF:                                3D EF:        24 %                                LV EDV:       189 ml                                LV ESV:       144 ml                                LV SV:  45 ml RIGHT VENTRICLE RV Basal diam:  3.60 cm RV Mid diam:    3.20 cm LEFT ATRIUM             Index        RIGHT ATRIUM          Index LA diam:        2.80 cm 2.00 cm/m   RA Area:     8.05 cm LA Vol (A2C):   43.6 ml 31.19 ml/m  RA Volume:   11.60 ml 8.30 ml/m LA Vol (A4C):   24.8 ml 17.74 ml/m LA Biplane Vol: 37.6 ml 26.90 ml/m   AORTIC VALVE AV Area (Vmax):    2.00 cm AV Area (Vmean):   1.73 cm AV Area (VTI):     2.21 cm AV Vmax:           130.00 cm/s AV Vmean:          90.633 cm/s AV VTI:            0.157 m AV Peak Grad:      6.8 mmHg AV Mean Grad:      4.0 mmHg LVOT Vmax:         75.00 cm/s LVOT Vmean:        45.400 cm/s LVOT VTI:          0.100 m LVOT/AV VTI ratio: 0.64 AI PHT:            469 msec  AORTA Ao Root diam: 2.70 cm MITRAL VALVE               TRICUSPID VALVE MV Area (PHT): 3.79 cm    TR Peak grad:   29.4 mmHg MV Decel Time: 200 msec    TR Vmax:        271.00 cm/s MV E velocity: 62.90 cm/s MV A velocity: 87.50 cm/s  SHUNTS MV E/A ratio:  0.72        Systemic VTI:  0.10 m                            Systemic Diam: 2.10 cm Deatrice Cage MD Electronically signed by Deatrice Cage MD Signature Date/Time: 08/13/2024/4:06:48 PM    Final    DG Chest Port 1 View Result Date: 08/13/2024 CLINICAL DATA:  Central line placement. EXAM: PORTABLE CHEST 1 VIEW COMPARISON:  Radiographs 08/13/2024.  CT 05/10/2024. FINDINGS: 1218 hours. Two views submitted. New right IJ central venous catheter projects to the mid SVC level. Endotracheal tube, enteric tube and left subclavian AICD leads appear unchanged. The heart size and mediastinal contours are stable. Mild atelectasis at both lung bases without evidence of confluent airspace disease, significant pleural effusion or pneumothorax. The bones appear unchanged. IMPRESSION: New right IJ central venous catheter projects to the mid SVC level. No evidence of pneumothorax. Electronically Signed   By: Elsie Perone M.D.   On: 08/13/2024 12:48    Scheduled Meds:  B-complex with vitamin C  1 tablet Oral Daily   darunavir -cobicistat   1 tablet Oral Q supper   doxycycline   100 mg Oral Q12H   feeding supplement  237 mL Oral BID BM   heparin   5,000 Units Subcutaneous Q8H   insulin  aspart  0-15 Units Subcutaneous Q4H   ipratropium-albuterol   3 mL Nebulization TID   multivitamin with minerals  1  tablet Oral Daily   polyethylene glycol  17 g Oral Daily   rilpivirine   25  mg Oral Q supper   senna  1 tablet Oral BID   thiamine   100 mg Oral Daily   Continuous Infusions:  lactated ringers  50 mL/hr at 08/15/24 1119   piperacillin -tazobactam (ZOSYN )  IV 100 mL/hr at 08/15/24 0556     LOS: 2 days   Norval Bar, MD  [facility]  08/15/2024, 12:02 PM   "

## 2024-08-15 NOTE — Progress Notes (Signed)
 "  Date of Admission:  08/13/2024     ID: Claire Day is a 59 y.o. female  Principal Problem:   Acute on chronic respiratory failure with hypoxia and hypercapnia (HCC) Active Problems:   Protein-calorie malnutrition, severe  Claire Day is a 59 y.o. female with a history of HIV/AIDS diagnosed in 2002 on darunavir /cobicistat  and rilpivirine , COPD, nonischemic cardiomyopathy, LBBB, AICD/pacemaker, CKD, nephrolithiasis was recently in the hospital with COPD exacerbation and also had flu A and COVID infections.  She was treated with Tamiflu  under HIV medications were continued.  She was discharged on 07/27/2024 She presents to the ED he on 08/13/2024 with shortness of breath and tripoding.  She had received magnesium  IV Vitals in the ED BP of 156/107, heart rate 134, respiratory 36, sats 68% and temperature was 98.4.  she was emergently intubated.  Subjective: Pt walking to the bathroom with PT Weak , some sob, oxygen nasal cannula   Medications:   B-complex with vitamin C  1 tablet Oral Daily   darunavir -cobicistat   1 tablet Oral Q supper   doxycycline   100 mg Oral Q12H   feeding supplement  237 mL Oral BID BM   heparin   5,000 Units Subcutaneous Q8H   insulin  aspart  0-15 Units Subcutaneous Q4H   ipratropium-albuterol   3 mL Nebulization TID   multivitamin with minerals  1 tablet Oral Daily   polyethylene glycol  17 g Oral Daily   rilpivirine   25 mg Oral Q supper   senna  1 tablet Oral BID   thiamine   100 mg Oral Daily    Objective: Vital signs in last 24 hours: Patient Vitals for the past 24 hrs:  BP Temp Temp src Pulse Resp SpO2 Weight  08/15/24 0755 129/80 98 F (36.7 C) Oral (!) 109 16 96 % --  08/15/24 0710 133/86 (!) 97.5 F (36.4 C) Oral (!) 105 20 98 % --  08/15/24 0500 -- -- -- -- -- -- 44.6 kg  08/15/24 0400 127/78 -- -- (!) 105 15 90 % --  08/15/24 0348 -- 98.1 F (36.7 C) Oral -- -- -- --  08/15/24 0200 97/71 -- -- -- 13 -- --  08/15/24 0000 108/82 -- -- --  16 -- --  08/14/24 2354 -- 98 F (36.7 C) Oral -- -- -- --  08/14/24 2300 107/73 -- -- 93 16 99 % --  08/14/24 2232 121/83 -- -- -- 16 -- --  08/14/24 2200 114/83 -- -- 71 20 100 % --  08/14/24 2124 102/75 -- -- (!) 113 16 98 % --  08/14/24 2000 100/77 -- -- (!) 103 17 96 % --  08/14/24 1930 99/65 97.8 F (36.6 C) Oral 98 17 94 % --  08/14/24 1900 110/69 -- -- (!) 107 17 96 % --  08/14/24 1745 -- -- -- 98 (!) 23 98 % --  08/14/24 1730 105/73 (!) 96.6 F (35.9 C) -- 89 17 96 % --  08/14/24 1715 -- (!) 96.6 F (35.9 C) -- 93 18 95 % --  08/14/24 1700 (!) 84/60 (!) 96.6 F (35.9 C) -- 84 (!) 31 97 % --  08/14/24 1630 105/75 (!) 96.6 F (35.9 C) -- 99 (!) 22 95 % --  08/14/24 1615 105/76 (!) 96.4 F (35.8 C) -- 91 (!) 32 100 % --  08/14/24 1600 (!) 87/69 (!) 96.8 F (36 C) -- (!) 103 15 (!) 86 % --  08/14/24 1545 97/77 (!) 96.8 F (36 C) --  95 19 92 % --  08/14/24 1531 104/74 (!) 96.8 F (36 C) -- 94 16 94 % --  08/14/24 1530 104/74 (!) 96.8 F (36 C) -- 90 13 95 % --  08/14/24 1515 103/77 (!) 97 F (36.1 C) -- 93 19 95 % --  08/14/24 1500 (!) 129/113 (!) 97 F (36.1 C) -- 93 19 97 % --  08/14/24 1445 110/83 (!) 97 F (36.1 C) -- 89 17 92 % --  08/14/24 1430 92/68 (!) 97 F (36.1 C) -- 94 14 97 % --  08/14/24 1419 -- (!) 96.8 F (36 C) -- 91 16 96 % --  08/14/24 1415 96/65 (!) 96.8 F (36 C) -- 96 (!) 23 93 % --  08/14/24 1200 98/63 (!) 96.3 F (35.7 C) -- 73 18 93 % --  08/14/24 1145 97/61 (!) 96.4 F (35.8 C) -- 74 14 92 % --  08/14/24 1143 97/61 (!) 96.3 F (35.7 C) -- 78 14 99 % --       PHYSICAL EXAM:  General: Awake, alert, some resp distress emaciated  Lungs: b/l air entry Heart: Regular rate and rhythm, no murmur, rub or gallop. Abdomen: Soft, non-tender,not distended. Bowel sounds normal. No masses Extremities: atraumatic, no cyanosis. No edema. No clubbing Skin: No rashes or lesions. Or bruising Lymph: Cervical, supraclavicular normal. Neurologic:  Grossly non-focal  Lab Results    Latest Ref Rng & Units 08/15/2024    3:46 AM 08/14/2024    4:34 AM 08/13/2024    4:11 AM  CBC  WBC 4.0 - 10.5 K/uL 9.2  11.5  9.5   Hemoglobin 12.0 - 15.0 g/dL 88.5  87.7  86.7   Hematocrit 36.0 - 46.0 % 35.4  38.2  42.2   Platelets 150 - 400 K/uL 141  187  215        Latest Ref Rng & Units 08/15/2024    3:46 AM 08/14/2024    4:34 AM 08/13/2024    5:14 PM  CMP  Glucose 70 - 99 mg/dL 90  898    BUN 6 - 20 mg/dL 46  37    Creatinine 9.55 - 1.00 mg/dL 6.51  7.23    Sodium 864 - 145 mmol/L 141  139    Potassium 3.5 - 5.1 mmol/L 5.0  4.9  4.7   Chloride 98 - 111 mmol/L 109  108    CO2 22 - 32 mmol/L 21  20    Calcium  8.9 - 10.3 mg/dL 8.4  7.9        Microbiology: Resp culture on 08/13/24 Resp pathogen 20 neg  Studies/Results: CXR 08/13/24  Hyperinflated lung fields- no infiltrate  US  RENAL Result Date: 08/15/2024 CLINICAL DATA:  Acute renal insufficiency. EXAM: RENAL / URINARY TRACT ULTRASOUND COMPLETE COMPARISON:  CT 04/05/2024 FINDINGS: Right Kidney: Renal measurements: 9.2 x 5.5 x 4.2 cm = volume: 111 mL. Mild increased cortical echogenicity. Cortical thickness is normal. Mild fullness of the right intrarenal collecting system. 1.2 cm shadowing stone over the mid pole. Trace free fluid adjacent the upper pole. Left Kidney: Renal measurements: 9.1 x 5.2 x 4.1 cm = volume: 93 mL. Mild increased cortical echogenicity. Cortical thickness is normal. No significant hydronephrosis. 7 mm stone over the lower pole. Bladder: Appears normal for degree of bladder distention. Bilateral ureteral jets visualized. Other: None. IMPRESSION: 1. Normal size kidneys with mild increased cortical echogenicity as can be seen with medical renal disease. 2. Bilateral nephrolithiasis. Mild fullness of the right intrarenal  collecting system. Electronically Signed   By: Toribio Agreste M.D.   On: 08/15/2024 09:42   ECHOCARDIOGRAM COMPLETE Result Date: 08/13/2024    ECHOCARDIOGRAM  REPORT   Patient Name:   Claire Day Date of Exam: 08/13/2024 Medical Rec #:  [id number]        Height:       68.1 in Accession #:    [id number]       Weight:       81.6 lb Date of Birth:  04-May-1966        BSA:          1.398 m Patient Age:    58 years         BP:           87/49 mmHg Patient Gender: F                HR:           92 bpm. Exam Location:  [facility] Procedure: 2D Echo, 3D Echo, Color Doppler, Cardiac Doppler and Strain Analysis            (Both Spectral and Color Flow Doppler were utilized during            procedure). Indications:     CHF-acute diastolic I50.31  History:         Patient has no prior history of Echocardiogram examinations.                  COPD, Arrythmias:LBBB; Risk Factors:Hypertension.  Sonographer:     Christopher Furnace Referring Phys:  [id number] ALMARIE BAKE OUMA Diagnosing Phys: Deatrice Cage MD  Sonographer Comments: Echo performed with patient supine and on artificial respirator. Global longitudinal strain was attempted. IMPRESSIONS  1. Left ventricular ejection fraction, by estimation, is 25 to 30%. Left ventricular ejection fraction by 3D volume is 24 %. The left ventricle has severely decreased function. The left ventricle demonstrates global hypokinesis. There is mild left ventricular hypertrophy. Left ventricular diastolic parameters are consistent with Grade I diastolic dysfunction (impaired relaxation). The average left ventricular global longitudinal strain is -6.1 %. The global longitudinal strain is abnormal.  2. Right ventricular systolic function is normal. The right ventricular size is normal. There is normal pulmonary artery systolic pressure.  3. The mitral valve is normal in structure. No evidence of mitral valve regurgitation. No evidence of mitral stenosis.  4. The aortic valve is normal in structure. Aortic valve regurgitation is moderate. Aortic valve sclerosis/calcification is present, without any evidence of aortic stenosis. FINDINGS  Left Ventricle: Left  ventricular ejection fraction, by estimation, is 25 to 30%. Left ventricular ejection fraction by 3D volume is 24 %. The left ventricle has severely decreased function. The left ventricle demonstrates global hypokinesis. The average  left ventricular global longitudinal strain is -6.1 %. Strain was performed and the global longitudinal strain is abnormal. The left ventricular internal cavity size was normal in size. There is mild left ventricular hypertrophy. Left ventricular diastolic parameters are consistent with Grade I diastolic dysfunction (impaired relaxation). Right Ventricle: The right ventricular size is normal. No increase in right ventricular wall thickness. Right ventricular systolic function is normal. There is normal pulmonary artery systolic pressure. The tricuspid regurgitant velocity is 2.71 m/s, and  with an assumed right atrial pressure of 5 mmHg, the estimated right ventricular systolic pressure is 34.4 mmHg. Left Atrium: Left atrial size was normal in size. Right Atrium: Right atrial size was normal in size. Pericardium: Trivial pericardial  effusion is present. Mitral Valve: The mitral valve is normal in structure. No evidence of mitral valve regurgitation. No evidence of mitral valve stenosis. Tricuspid Valve: The tricuspid valve is normal in structure. Tricuspid valve regurgitation is mild . No evidence of tricuspid stenosis. Aortic Valve: The aortic valve is normal in structure. Aortic valve regurgitation is moderate. Aortic regurgitation PHT measures 469 msec. Aortic valve sclerosis/calcification is present, without any evidence of aortic stenosis. Aortic valve mean gradient measures 4.0 mmHg. Aortic valve peak gradient measures 6.8 mmHg. Aortic valve area, by VTI measures 2.21 cm. Pulmonic Valve: The pulmonic valve was normal in structure. Pulmonic valve regurgitation is trivial. No evidence of pulmonic stenosis. Aorta: The aortic root is normal in size and structure. Venous: The inferior  vena cava was not well visualized. IAS/Shunts: No atrial level shunt detected by color flow Doppler. Additional Comments: A device lead is visualized.  LEFT VENTRICLE PLAX 2D LVIDd:         4.70 cm         Diastology LVIDs:         4.00 cm         LV e' medial:    6.20 cm/s LV PW:         1.30 cm         LV E/e' medial:  10.1 LV IVS:        1.30 cm         LV e' lateral:   5.00 cm/s LVOT diam:     2.10 cm         LV E/e' lateral: 12.6 LV SV:         35 LV SV Index:   25              2D Longitudinal LVOT Area:     3.46 cm        Strain LV IVRT:       114 msec        2D Strain GLS   -6.1 %                                Avg:  LV Volumes (MOD)               3D Volume EF LV vol d, MOD    127.0 ml      LV 3D EF:    Left A2C:                                        ventricul LV vol d, MOD    115.0 ml                   ar A4C:                                        ejection LV vol s, MOD    96.3 ml                    fraction A2C:  by 3D LV vol s, MOD    85.6 ml                    volume is A4C:                                        24 %. LV SV MOD A2C:   30.7 ml LV SV MOD A4C:   115.0 ml LV SV MOD BP:    30.1 ml       3D Volume EF:                                3D EF:        24 %                                LV EDV:       189 ml                                LV ESV:       144 ml                                LV SV:        45 ml RIGHT VENTRICLE RV Basal diam:  3.60 cm RV Mid diam:    3.20 cm LEFT ATRIUM             Index        RIGHT ATRIUM          Index LA diam:        2.80 cm 2.00 cm/m   RA Area:     8.05 cm LA Vol (A2C):   43.6 ml 31.19 ml/m  RA Volume:   11.60 ml 8.30 ml/m LA Vol (A4C):   24.8 ml 17.74 ml/m LA Biplane Vol: 37.6 ml 26.90 ml/m  AORTIC VALVE AV Area (Vmax):    2.00 cm AV Area (Vmean):   1.73 cm AV Area (VTI):     2.21 cm AV Vmax:           130.00 cm/s AV Vmean:          90.633 cm/s AV VTI:            0.157 m AV Peak Grad:      6.8 mmHg AV Mean Grad:       4.0 mmHg LVOT Vmax:         75.00 cm/s LVOT Vmean:        45.400 cm/s LVOT VTI:          0.100 m LVOT/AV VTI ratio: 0.64 AI PHT:            469 msec  AORTA Ao Root diam: 2.70 cm MITRAL VALVE               TRICUSPID VALVE MV Area (PHT): 3.79 cm    TR Peak grad:   29.4 mmHg MV Decel Time: 200 msec    TR Vmax:        271.00 cm/s MV E velocity: 62.90 cm/s MV A velocity: 87.50 cm/s  SHUNTS MV E/A ratio:  0.72        Systemic VTI:  0.10 m                            Systemic Diam: 2.10 cm Deatrice Cage MD Electronically signed by Deatrice Cage MD Signature Date/Time: 08/13/2024/4:06:48 PM    Final    DG Chest Port 1 View Result Date: 08/13/2024 CLINICAL DATA:  Central line placement. EXAM: PORTABLE CHEST 1 VIEW COMPARISON:  Radiographs 08/13/2024.  CT 05/10/2024. FINDINGS: 1218 hours. Two views submitted. New right IJ central venous catheter projects to the mid SVC level. Endotracheal tube, enteric tube and left subclavian AICD leads appear unchanged. The heart size and mediastinal contours are stable. Mild atelectasis at both lung bases without evidence of confluent airspace disease, significant pleural effusion or pneumothorax. The bones appear unchanged. IMPRESSION: New right IJ central venous catheter projects to the mid SVC level. No evidence of pneumothorax. Electronically Signed   By: Elsie Perone M.D.   On: 08/13/2024 12:48     Assessment/Plan: Acute hypoxic and hypercapneic resp falilure due to COPD exacerbation- s/p extubation No clinical or CXR evidence of PJP or any pneumonia Currently on zosyn  and doxycycline - DC the former   AIDS Viral load undetectable in March 2025 and CD4 count was 66.  Patient was taking darunavir /cobicistat  plus rilpivirine .  Her last prescription refill was on 06/08/2024 HIV RNA P and CD4 count is 71- on Dapsone  for PJP prophylaxis Restarted HAART Will enroll her in [facility] as she has no HIV provider here Seen by case manager Jori Baptist [phone]  AKI on CKD  worsening Dc zosyn   ? Recent hospitalization for COPD exacerbation in December 2025 Also had influenza A and COVID then   Nonischemic cardiomyopathy pacemaker/AICD      Chronic thrombocytopenia   Past history of PJP   Discussed the management with the patient and ID pharmacist   "

## 2024-08-15 NOTE — Progress Notes (Signed)
  Inpatient Rehab Admissions Coordinator :  Per therapy recommendations, patient was screened for CIR candidacy by Ottie Glazier RN MSN.  At this time patient appears to be a potential candidate for CIR. I will place a rehab consult per protocol for full assessment. Please call me with any questions.  Ottie Glazier RN MSN Admissions Coordinator [phone]

## 2024-08-15 NOTE — Consult Note (Signed)
 "  Cardiology Consultation   Patient ID: KADY TOOTHAKER MRN: [id number]; DOB: 1965/11/25  Admit date: 08/13/2024 Date of Consult: 08/15/2024  PCP:  Meade Bigness, MD   [facility] Providers Cardiologist:  None        Patient Profile: Claire Day is a 59 y.o. female with a hx of chronic systolic heart failure s/p ICD 2019, NICM, LBBB, HTN, HIV, COPD, HLD, TIA, and tobacco use who is being seen 08/15/2024 for the evaluation of acute on chronic HFrEF at the request of Dr. Cosette.  History of Present Illness: Claire Day is followed by Dr. Mearl Gene in [place], [place] for her cardiac conditions. She has a history of chronic systolic heart failure dating back to at least 2018 with notes indicating a cardiac catheterization at that time with no evidence of CAD. Notes report echo readings with EF 25-30% despite optimal medical therapy, prompting placement of Medtronic CRT-D on 09/20/2017. Most recent echo 01/2023, done in the setting of admission for TIA at [facility], revealed improvement in EF to 63% with mild to moderate AI and mild TR.   Patient was recently admitted to [facility] earlier this month for COPD exacerbation in the setting of influenza and and COVID 19. She was treated with Tamiflu , steroids, and supportive care with breathing treatments and pain management.  She was placed on BiPAP for increased work of breathing and respiratory distress.  ID was consulted in the setting of HIV with low CD4 count.  She quickly improved was able to wean from BiPAP to room air on her second day of admission with O2 saturation remaining greater than 95% with ambulation on room air.  She was to charged 07/27/2024 with a course of doxycycline  for COPD exacerbation.  Patient reports feeling relatively well after discharge with worsening shortness of breath somewhat sudden in onset earlier this week.  EMS was called who found her to be tripoding and speaking in short sentences prompting them to place her on  BiPAP.  She also received IV magnesium  and Solu-Medrol  and route to the ED.  In the ED, her patient was reportedly agitated, repeatedly removing BiPAP.  She was normotensive and significantly tachycardic at 142 bpm with SpO2 of 93 to 100% on BiPAP.  ABG revealed pH 7.1, pCO2 59, HCO3 18, pO2 366, and O2 sat 100%.  She came progressively hypercapnic and hypoxemic and underwent rapid sequence intubation and mechanical ventilation.  Pertinent labs include WBC 9, Hgb 13.2, HCT 42.2, platelets 215, sodium 141, potassium 4.1, CO2 18, BUN 21, creatinine 1.37 chest x-ray showed hyperexpanded lungs with right base patchy airspace disease (atelectasis versus infiltrate).  She was treated with DuoNeb, lactated Ringer 's 500 mL bolus, and antibiotics.  She was admitted to the ICU.  She was extubated 1/20 and transferred out of the ICU.  Updated echo was dated and revealed EF reduced at 25 to 30% with global hypokinesis, mild LVH, G1 DD, and moderate AI.  Cardiology was asked to consult for further evaluation of acute on chronic HFrEF.  At time of cardiology consult, patient reports overall feeling better with improvements in shortness of breath.  She denies chest pain, lightheadedness, dizziness, palpitations, orthopnea, PND, and lower extremity swelling.  Past Medical History:  Diagnosis Date   Allergic rhinitis    Anemia    Aortic insufficiency    Cerebral aneurysm 02/12/2023   a.) CT head 02/12/2023:  3 x 3 mm distal LEFT M1 MCA just proximal to the bifurcation   Chronic  systolic heart failure (HCC)    CKD (chronic kidney disease), stage III (HCC)    COPD (chronic obstructive pulmonary disease) (HCC)    History of kidney stones    HIV (human immunodeficiency virus infection) (HCC) 2003   a.) Dx'd in 2003; b.) currently Tx'd with darunavir -cobicistat  + rilpivirine    HLD (hyperlipidemia)    Hydronephrosis    Hypertension    LBBB (left bundle branch block)    MI (myocardial infarction) (HCC)    Moderate  mitral regurgitation    Nephrolithiasis    NICM (nonischemic cardiomyopathy) (HCC)    Pneumonia due to Streptococcus    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   a.) Medtronic device placed 09/20/2017 --> CLARIA MRI CRT-D QUAD DF-4 (SN: [id number])   Right upper lobe pulmonary nodule 02/12/2023   a.) CT imaging 02/12/2023:  9 x 7 mm   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    Thrombocytopenia    TIA (transient ischemic attack) 02/12/2023   Tobacco abuse     Past Surgical History:  Procedure Laterality Date   BI-VENTRICULAR IMPLANTABLE CARDIOVERTER DEFIBRILLATOR  (CRT-D) Left 09/20/2017   Procedure: BI-VENTRICULAR IMPLANTABLE CARDIOVERTER DEFIBRILLATOR (CRT-D); Location: [facility]; Surgeon: Nancyann Ceo, MD   CHOLECYSTECTOMY     CYSTOSCOPY W/ URETERAL STENT PLACEMENT Left 06/10/2023   Procedure: CYSTOSCOPY WITH RETROGRADE PYELOGRAM/URETERAL STENT PLACEMENT;  Surgeon: Francisca Redell BROCKS, MD;  Location: [facility];  Service: Urology;  Laterality: Left;   CYSTOSCOPY/URETEROSCOPY/HOLMIUM LASER/STENT PLACEMENT Left 07/05/2023   Procedure: CYSTOSCOPY/URETEROSCOPY/HOLMIUM LASER/STENT EXCHANGE;  Surgeon: Twylla Glendia BROCKS, MD;  Location: [facility];  Service: Urology;  Laterality: Left;       Scheduled Meds:  B-complex with vitamin C  1 tablet Oral Daily   darunavir -cobicistat   1 tablet Oral Q supper   doxycycline   100 mg Oral Q12H   feeding supplement  237 mL Oral BID BM   heparin   5,000 Units Subcutaneous Q8H   insulin  aspart  0-15 Units Subcutaneous Q4H   ipratropium-albuterol   3 mL Nebulization TID   multivitamin with minerals  1 tablet Oral Daily   polyethylene glycol  17 g Oral Daily   rilpivirine   25 mg Oral Q supper   senna  1 tablet Oral BID   thiamine   100 mg Oral Daily   Continuous Infusions:  lactated ringers      piperacillin -tazobactam (ZOSYN )  IV 100 mL/hr at 08/15/24 0556   PRN Meds: acetaminophen , ipratropium-albuterol ,  ondansetron  (ZOFRAN ) IV, mouth rinse, phenol, polyethylene glycol, senna  Allergies:   Allergies[1]  Social History:   Social History   Socioeconomic History   Marital status: Widowed    Spouse name: Not on file   Number of children: Not on file   Years of education: Not on file   Highest education level: Not on file  Occupational History   Not on file  Tobacco Use   Smoking status: Every Day    Current packs/day: 0.50    Types: Cigarettes   Smokeless tobacco: Not on file  Vaping Use   Vaping status: Not on file  Substance and Sexual Activity   Alcohol use: No   Drug use: No   Sexual activity: Yes  Other Topics Concern   Not on file  Social History Narrative   Not on file   Social Drivers of Health   Tobacco Use: High Risk (07/25/2024)   Patient History    Smoking Tobacco Use: Every Day    Smokeless Tobacco Use: Unknown  Passive Exposure: Not on file  Financial Resource Strain: Not on file  Food Insecurity: No Food Insecurity (08/13/2024)   Epic    Worried About Programme Researcher, Broadcasting/film/video in the Last Year: Never true    Ran Out of Food in the Last Year: Never true  Transportation Needs: No Transportation Needs (08/13/2024)   Epic    Lack of Transportation (Medical): No    Lack of Transportation (Non-Medical): No  Physical Activity: Not on file  Stress: Not on file  Social Connections: Patient Declined (07/26/2024)   Social Connection and Isolation Panel    Frequency of Communication with Friends and Family: Patient declined    Frequency of Social Gatherings with Friends and Family: Patient declined    Attends Religious Services: Patient declined    Database Administrator or Organizations: Patient declined    Attends Banker Meetings: Patient declined    Marital Status: Patient declined  Intimate Partner Violence: Not At Risk (07/26/2024)   Epic    Fear of Current or Ex-Partner: No    Emotionally Abused: No    Physically Abused: No    Sexually Abused: No   Depression (PHQ2-9): Not on file  Alcohol Screen: Not on file  Housing: Unknown (08/13/2024)   Epic    Unable to Pay for Housing in the Last Year: Patient declined    Number of Times Moved in the Last Year: 0    Homeless in the Last Year: Patient declined  Utilities: Not At Risk (08/13/2024)   Epic    Threatened with loss of utilities: No  Health Literacy: Not on file    Family History:   No family history on file.   ROS:  Please see the history of present illness.   All other ROS reviewed and negative.     Physical Exam/Data: Vitals:   08/15/24 0400 08/15/24 0500 08/15/24 0710 08/15/24 0755  BP: 127/78  133/86 129/80  Pulse: (!) 105  (!) 105 (!) 109  Resp: 15  20 16   Temp:   (!) 97.5 F (36.4 C) 98 F (36.7 C)  TempSrc:   Oral Oral  SpO2: 90%  98% 96%  Weight:  44.6 kg      Intake/Output Summary (Last 24 hours) at 08/15/2024 0928 Last data filed at 08/15/2024 0556 Gross per 24 hour  Intake 2594.62 ml  Output 800 ml  Net 1794.62 ml      08/15/2024    5:00 AM 08/14/2024    5:00 AM 08/13/2024    4:43 AM  Last 3 Weights  Weight (lbs) 98 lb 5.2 oz 95 lb 3.8 oz 81 lb 9.1 oz  Weight (kg) 44.6 kg 43.2 kg 37 kg     Body mass index is 14.95 kg/m.  General:  Well nourished, well developed, in no acute distress HEENT: normal Neck: no JVD Vascular: No carotid bruits; Distal pulses 2+ bilaterally Cardiac:  normal S1, S2; RRR; no murmur  Lungs:  clear to auscultation bilaterally, no wheezing, rhonchi or rales  Abd: soft, nontender, no hepatomegaly  Ext: no edema Skin: warm and dry  Psych:  Normal affect   EKG:  The EKG from 1/19 was personally reviewed and demonstrates: Sinus tachycardia LBBB, rate 140 bpm Telemetry:  Telemetry was personally reviewed and demonstrates: Sinus rhythm  Relevant CV Studies:  08/13/2024 2D echo 1. Left ventricular ejection fraction, by estimation, is 25 to 30%. Left  ventricular ejection fraction by 3D volume is 24 %. The left ventricle  has  severely decreased function. The left ventricle demonstrates global  hypokinesis. There is mild left  ventricular hypertrophy. Left ventricular diastolic parameters are  consistent with Grade I diastolic dysfunction (impaired relaxation). The  average left ventricular global longitudinal strain is -6.1 %. The global  longitudinal strain is abnormal.   2. Right ventricular systolic function is normal. The right ventricular  size is normal. There is normal pulmonary artery systolic pressure.   3. The mitral valve is normal in structure. No evidence of mitral valve  regurgitation. No evidence of mitral stenosis.   4. The aortic valve is normal in structure. Aortic valve regurgitation is  moderate. Aortic valve sclerosis/calcification is present, without any  evidence of aortic stenosis.   01/2023 2D echo 1. Left ventricle  The left ventricular cavity size is normal. Wall     thickness is normal. Systolic function is normal by the biplane method of     disks. The ejection fraction is 63%  +--5% . No segmental wall motion     abnormalities. Inconclusive diastolic evaluation due to conflicting data.  2. Atrial septum  There is no evidence of intracardiac shunt by color     Doppler or following injection of agitated saline with maneuvers.  3. Aortic valve  There is mild to moderate aortic regurgitation.  4. Tricuspid valve  There is mild tricuspid regurgitation.  5. Pulmonary arteries  Pulmonary artery estimated peak systolic pressure     30mm Hg. This is consistent with normal pulmonary artery pressure.  No previous study was available for comparison.   Laboratory Data: High Sensitivity Troponin:  No results for input(s): TROPONINIHS in the last 720 hours.  Recent Labs  Lab 08/13/24 0855 08/13/24 1714 08/13/24 2150 08/14/24 2155 08/14/24 2358  TRNPT 110* 123* 126* 132* 129*      Chemistry Recent Labs  Lab 08/13/24 0411 08/13/24 1714 08/14/24 0434 08/15/24 0346  NA 141   --  139 141  K 4.1 4.7 4.9 5.0  CL 109  --  108 109  CO2 18*  --  20* 21*  GLUCOSE 248*  --  101* 90  BUN 21*  --  37* 46*  CREATININE 1.37*  --  2.76* 3.48*  CALCIUM  8.5*  --  7.9* 8.4*  MG  --  2.4 2.4 2.3  GFRNONAA 45*  --  19* 15*  ANIONGAP 14  --  11 10    Recent Labs  Lab 08/13/24 0411  PROT 5.9*  ALBUMIN 3.3*  AST 36  ALT 17  ALKPHOS 129*  BILITOT 0.2   Lipids No results for input(s): CHOL, TRIG, HDL, LABVLDL, LDLCALC, CHOLHDL in the last 168 hours.  Hematology Recent Labs  Lab 08/13/24 0411 08/14/24 0434 08/15/24 0346  WBC 9.5 11.5* 9.2  RBC 4.52 4.22 3.90  HGB 13.2 12.2 11.4*  HCT 42.2 38.2 35.4*  MCV 93.4 90.5 90.8  MCH 29.2 28.9 29.2  MCHC 31.3 31.9 32.2  RDW 14.5 14.5 15.0  PLT 215 187 141*   Thyroid No results for input(s): TSH, FREET4 in the last 168 hours.  BNP Recent Labs  Lab 08/13/24 0509  PROBNP 5,944.0*    DDimer No results for input(s): DDIMER in the last 168 hours.  Radiology/Studies:  DG Chest Port 1 View Result Date: 08/13/2024 IMPRESSION: New right IJ central venous catheter projects to the mid SVC level. No evidence of pneumothorax. Electronically Signed   By: Elsie Perone M.D.   On: 08/13/2024 12:48   DG Abd  1 View Result Date: 08/13/2024 IMPRESSION: 1. NGT tip abutting the proximal fundal wall and side port at the EG junction; advancement is recommended to position the side port below the hiatus. Electronically signed by: Francis Quam MD 08/13/2024 07:30 AM EST RP Workstation: [id number]   DG Abdomen 1 View Result Date: 08/13/2024 IMPRESSION: OG tube tip is in the proximal stomach with side port position in the region of the GE junction. Tube could be advanced approximately 5 cm to ensure side port positioning below the GE junction. Repeat x-ray recommended after repositioning. Electronically Signed   By: Camellia Candle M.D.   On: 08/13/2024 05:10   DG Chest Portable 1 View Result Date: 08/13/2024 IMPRESSION:  1. Endotracheal tube tip is approximately 4.3 cm above the base of the carina. 2. Interval decrease in patchy airspace disease at the right base. Electronically Signed   By: Camellia Candle M.D.   On: 08/13/2024 05:10   DG Chest Portable 1 View Result Date: 08/13/2024 IMPRESSION: Subtle patchy airspace disease at the right lung base. Imaging features could be related to atelectasis or developing infiltrate. Electronically Signed   By: Camellia Candle M.D.   On: 08/13/2024 05:06   Assessment and Plan:  Acute on chronic HFrEF NICM s/p ICD - Longstanding history of nonischemic cardiomyopathy dating back to at least 2018 with EF as low as 25-30%, most recent echo 01/2023 showing recovered EF 63% - Echo this admission with EF 25-30%, global hypokinesis, mild LVH, G1 DD, moderate AI, and normal pulmonary artery systolic pressure - proBNP 5944, up from last admission 07/25/2024 with proBNP 3282 at that time - Chest x-ray without pulmonary edema and effusion - Appears euvolemic on exam - Will need to monitor volume status closely with administration of IV fluids - GDMT limited at this time due to hypotension and renal dysfunction  Acute hypoxic hypercapnic respiratory failure COPD Pneumonia - Initially requiring BiPAP with worsening respiratory status requiring intubation.  Extubated 1/20.  Now weaned off supplemental oxygen. - Ongoing management per IM  AKI - Creatinine 1.37 on admission, trending up to 3.48 today - Could be ATN secondary to hypotension on admission - Receiving gentle IV hydration this morning   For questions or updates, please contact [facility] Please consult [email or web address] for contact info under      Signed, Lesley LITTIE Maffucci, PA-C  08/15/2024 9:28 AM     [1]  Allergies Allergen Reactions   Sulfonamide Derivatives Hives and Dermatitis   Tramadol Other (See Comments)    Upset stomach and it gave her the shakes    "

## 2024-08-16 DIAGNOSIS — J9601 Acute respiratory failure with hypoxia: Secondary | ICD-10-CM | POA: Diagnosis not present

## 2024-08-16 DIAGNOSIS — I5023 Acute on chronic systolic (congestive) heart failure: Secondary | ICD-10-CM | POA: Diagnosis not present

## 2024-08-16 DIAGNOSIS — B20 Human immunodeficiency virus [HIV] disease: Secondary | ICD-10-CM | POA: Diagnosis not present

## 2024-08-16 DIAGNOSIS — J441 Chronic obstructive pulmonary disease with (acute) exacerbation: Secondary | ICD-10-CM | POA: Diagnosis not present

## 2024-08-16 DIAGNOSIS — J9602 Acute respiratory failure with hypercapnia: Secondary | ICD-10-CM | POA: Diagnosis not present

## 2024-08-16 DIAGNOSIS — J9621 Acute and chronic respiratory failure with hypoxia: Secondary | ICD-10-CM | POA: Diagnosis not present

## 2024-08-16 DIAGNOSIS — J9622 Acute and chronic respiratory failure with hypercapnia: Secondary | ICD-10-CM | POA: Diagnosis not present

## 2024-08-16 DIAGNOSIS — I429 Cardiomyopathy, unspecified: Secondary | ICD-10-CM

## 2024-08-16 DIAGNOSIS — E43 Unspecified severe protein-calorie malnutrition: Secondary | ICD-10-CM | POA: Diagnosis not present

## 2024-08-16 LAB — GLUCOSE, CAPILLARY
Glucose-Capillary: 108 mg/dL — ABNORMAL HIGH (ref 70–99)
Glucose-Capillary: 109 mg/dL — ABNORMAL HIGH (ref 70–99)
Glucose-Capillary: 68 mg/dL — ABNORMAL LOW (ref 70–99)
Glucose-Capillary: 72 mg/dL (ref 70–99)
Glucose-Capillary: 73 mg/dL (ref 70–99)
Glucose-Capillary: 84 mg/dL (ref 70–99)
Glucose-Capillary: 85 mg/dL (ref 70–99)
Glucose-Capillary: 97 mg/dL (ref 70–99)

## 2024-08-16 LAB — FUNGITELL BETA-D-GLUCAN: Fungitell Value:: <31.25 pg/mL

## 2024-08-16 LAB — BASIC METABOLIC PANEL WITH GFR
Anion gap: 7 (ref 5–15)
Anion gap: 12 (ref 5–15)
BUN: 52 mg/dL — ABNORMAL HIGH (ref 6–20)
BUN: 46 mg/dL — ABNORMAL HIGH (ref 6–20)
CO2: 25 mmol/L (ref 22–32)
CO2: 22 mmol/L (ref 22–32)
Calcium: 8.7 mg/dL — ABNORMAL LOW (ref 8.9–10.3)
Calcium: 8.8 mg/dL — ABNORMAL LOW (ref 8.9–10.3)
Chloride: 112 mmol/L — ABNORMAL HIGH (ref 98–111)
Chloride: 111 mmol/L (ref 98–111)
Creatinine, Ser: 4.02 mg/dL — ABNORMAL HIGH (ref 0.44–1.00)
Creatinine, Ser: 3.92 mg/dL — ABNORMAL HIGH (ref 0.44–1.00)
GFR, Estimated: 12 mL/min — ABNORMAL LOW
GFR, Estimated: 13 mL/min — ABNORMAL LOW
Glucose, Bld: 80 mg/dL (ref 70–99)
Glucose, Bld: 101 mg/dL — ABNORMAL HIGH (ref 70–99)
Potassium: 5.1 mmol/L (ref 3.5–5.1)
Potassium: 5.1 mmol/L (ref 3.5–5.1)
Sodium: 144 mmol/L (ref 135–145)
Sodium: 145 mmol/L (ref 135–145)

## 2024-08-16 LAB — CULTURE, RESPIRATORY W GRAM STAIN: Culture: NORMAL

## 2024-08-16 LAB — CBC
HCT: 41.3 % (ref 36.0–46.0)
Hemoglobin: 13.4 g/dL (ref 12.0–15.0)
MCH: 28.9 pg (ref 26.0–34.0)
MCHC: 32.4 g/dL (ref 30.0–36.0)
MCV: 89.2 fL (ref 80.0–100.0)
Platelets: 168 K/uL (ref 150–400)
RBC: 4.63 MIL/uL (ref 3.87–5.11)
RDW: 15.4 % (ref 11.5–15.5)
WBC: 7.1 K/uL (ref 4.0–10.5)
nRBC: 0.3 % — ABNORMAL HIGH (ref 0.0–0.2)

## 2024-08-16 LAB — UREA NITROGEN, URINE: Urea Nitrogen, Ur: 180 mg/dL

## 2024-08-16 LAB — LEGIONELLA PNEUMOPHILA SEROGP 1 UR AG: L. pneumophila Serogp 1 Ur Ag: NEGATIVE

## 2024-08-16 LAB — MAGNESIUM: Magnesium: 2.5 mg/dL — ABNORMAL HIGH (ref 1.7–2.4)

## 2024-08-16 LAB — PHOSPHORUS: Phosphorus: 3.2 mg/dL (ref 2.5–4.6)

## 2024-08-16 MED ORDER — IPRATROPIUM-ALBUTEROL 0.5-2.5 (3) MG/3ML IN SOLN
3.0000 mL | Freq: Two times a day (BID) | RESPIRATORY_TRACT | Status: DC
  Administered 2024-08-17 – 2024-08-19 (×5): 3 mL via RESPIRATORY_TRACT
  Filled 2024-08-16 (×6): qty 3

## 2024-08-16 MED ORDER — ISOSORB DINITRATE-HYDRALAZINE 20-37.5 MG PO TABS
1.0000 | ORAL_TABLET | Freq: Three times a day (TID) | ORAL | Status: DC
  Administered 2024-08-16 – 2024-08-21 (×14): 1 via ORAL
  Filled 2024-08-16 (×17): qty 1

## 2024-08-16 NOTE — Progress Notes (Signed)
 "  Date of Admission:  08/13/2024     ID: Claire Day is a 59 y.o. female  Principal Problem:   Acute on chronic respiratory failure with hypoxia and hypercapnia (HCC) Active Problems:   HIV infection (HCC)   Protein-calorie malnutrition, severe   Dilated cardiomyopathy (HCC)   ICD (implantable cardioverter-defibrillator) in place  Claire Day is a 59 y.o. female with a history of HIV/AIDS diagnosed in 2002 on darunavir /cobicistat  and rilpivirine , COPD, nonischemic cardiomyopathy, LBBB, AICD/pacemaker, CKD, nephrolithiasis was recently in the hospital with COPD exacerbation and also had flu A and COVID infections.  She was treated with Tamiflu  under HIV medications were continued.  She was discharged on 07/27/2024 She presents to the ED he on 08/13/2024 with shortness of breath and tripoding.  She had received magnesium  IV Vitals in the ED BP of 156/107, heart rate 134, respiratory 36, sats 68% and temperature was 98.4.  she was emergently intubated.  Subjective: States she is feeling better Still has nasal oxygen   Medications:   B-complex with vitamin C  1 tablet Oral Daily   calcium  carbonate  1 tablet Oral TID WC   dapsone   100 mg Oral QHS   darunavir -cobicistat   1 tablet Oral Q supper   doxycycline   100 mg Oral Q12H   feeding supplement  237 mL Oral BID BM   heparin   5,000 Units Subcutaneous Q8H   insulin  aspart  0-15 Units Subcutaneous Q4H   [START ON 08/17/2024] ipratropium-albuterol   3 mL Nebulization BID   isosorbide -hydrALAZINE   1 tablet Oral TID   mirtazapine   15 mg Oral QHS   multivitamin with minerals  1 tablet Oral Daily   polyethylene glycol  17 g Oral Daily   rilpivirine   25 mg Oral Q supper   senna  1 tablet Oral BID   thiamine   100 mg Oral Daily    Objective: Vital signs in last 24 hours: Patient Vitals for the past 24 hrs:  BP Temp Temp src Pulse Resp SpO2 Weight  08/16/24 0813 (!) 153/98 98.2 F (36.8 C) -- (!) 107 16 97 % --  08/16/24 0752 -- --  -- -- -- 95 % --  08/16/24 0440 -- -- -- -- -- -- 45 kg  08/16/24 0424 (!) 140/97 98.7 F (37.1 C) Oral (!) 102 -- 100 % --  08/15/24 2017 (!) 139/100 98.4 F (36.9 C) Oral (!) 106 17 98 % --  08/15/24 1951 -- -- -- -- -- 97 % --  08/15/24 1703 (!) 138/99 97.9 F (36.6 C) Oral (!) 106 16 100 % --       PHYSICAL EXAM:  General: sitting in chair- alert emaciated  Lungs: b/l air entry Heart: Regular rate and rhythm, no murmur, rub or gallop. Abdomen: Soft, non-tender,not distended. Bowel sounds normal. No masses Extremities: atraumatic, no cyanosis. No edema. No clubbing Skin: No rashes or lesions. Or bruising Lymph: Cervical, supraclavicular normal. Neurologic: Grossly non-focal  Lab Results    Latest Ref Rng & Units 08/16/2024    4:58 AM 08/15/2024    3:46 AM 08/14/2024    4:34 AM  CBC  WBC 4.0 - 10.5 K/uL 7.1  9.2  11.5    11.7   Hemoglobin 12.0 - 15.0 g/dL 86.5  88.5  87.7    87.4   Hematocrit 36.0 - 46.0 % 41.3  35.4  38.2    37.6   Platelets 150 - 400 K/uL 168  141  187    184  Latest Ref Rng & Units 08/16/2024    1:31 PM 08/16/2024    4:58 AM 08/15/2024    3:46 AM  CMP  Glucose 70 - 99 mg/dL 898  80  90   BUN 6 - 20 mg/dL 46  52  46   Creatinine 0.44 - 1.00 mg/dL 6.07  5.97  6.51   Sodium 135 - 145 mmol/L 145  144  141   Potassium 3.5 - 5.1 mmol/L 5.1  5.1  5.0   Chloride 98 - 111 mmol/L 111  112  109   CO2 22 - 32 mmol/L 22  25  21    Calcium  8.9 - 10.3 mg/dL 8.8  8.7  8.4       Microbiology: Resp culture on 08/13/24 Resp pathogen 20 neg  Studies/Results: CXR 08/13/24  Hyperinflated lung fields- no infiltrate  US  RENAL Result Date: 08/15/2024 CLINICAL DATA:  Acute renal insufficiency. EXAM: RENAL / URINARY TRACT ULTRASOUND COMPLETE COMPARISON:  CT 04/05/2024 FINDINGS: Right Kidney: Renal measurements: 9.2 x 5.5 x 4.2 cm = volume: 111 mL. Mild increased cortical echogenicity. Cortical thickness is normal. Mild fullness of the right intrarenal  collecting system. 1.2 cm shadowing stone over the mid pole. Trace free fluid adjacent the upper pole. Left Kidney: Renal measurements: 9.1 x 5.2 x 4.1 cm = volume: 93 mL. Mild increased cortical echogenicity. Cortical thickness is normal. No significant hydronephrosis. 7 mm stone over the lower pole. Bladder: Appears normal for degree of bladder distention. Bilateral ureteral jets visualized. Other: None. IMPRESSION: 1. Normal size kidneys with mild increased cortical echogenicity as can be seen with medical renal disease. 2. Bilateral nephrolithiasis. Mild fullness of the right intrarenal collecting system. Electronically Signed   By: Toribio Agreste M.D.   On: 08/15/2024 09:42     Assessment/Plan: Acute hypoxic and hypercapneic resp falilure due to COPD exacerbation- s/p extubation No clinical or CXR evidence of PJP or any pneumonia Currently on doxycycline -to give for total of 5 days    AIDS Viral load was undetectable in March 2025 and CD4 count was 66.  Patient was taking darunavir /cobicistat  plus rilpivirine .  Her last prescription refill was on 06/08/2024 Current HIV RNA 2240  and CD4 count is 73- on Dapsone  for PJP prophylaxis Restarted HAART Will enroll her in [facility] as she has no HIV provider here Seen by case manager Jori Baptist [phone]  AKI on CKD worsening Dc zosyn  Renal US  b/l nephrolithiasis - mild dilartion of rt renal collecting system  ? Recent hospitalization for COPD exacerbation in December 2025 Also had influenza A and COVID then   Nonischemic cardiomyopathy pacemaker/AICD      Chronic thrombocytopenia   Past history of PJP   Discussed the management with the patient and ID pharmacist   "

## 2024-08-16 NOTE — Progress Notes (Signed)
 "   NAME:  Claire Day, MRN:  [id number], DOB:  01-08-1966, LOS: 3 ADMISSION DATE:  08/13/2024, CONSULTATION DATE:  08/13/24 REFERRING MD: Fernand, CHIEF COMPLAINT:  shortness of breath   HPI  59 year old female with PMH significant for HIV (low CD4), COPD (not on home O?), active tobacco use, NICM with HFrEF s/p AICD, HTN, CKD III, cerebral aneurysm, prior TIA, recent Influenza A and COVID-19 infection presenting to the ED with complaints of acute respiratory distress.  On chart review patient with recent admission (07/25/24-07/27/24) for acute respiratory failure secondary to AECOPD, Influenza A, and COVID-19, treated with steroids, antivirals, antibiotics, and BiPAP, discharged on room air. She re-presented today via EMS with severe shortness of breath. On EMS arrival, she was tripoding and speaking in short sentences and was placed on BiPAP, receiving IV magnesium  and Solu-Medrol  125 mg en route.  ED Course / Interventions: On arrival, she was agitated, repeatedly removing BiPAP. Vitals: BP 114/85 ? 160/115, HR 142 ? 151, RR 15, Temp 98.4F, SpO? 93-100% on BiPAP. ABG: pH 7.10, pCO? 59, HCO? 18, pO? 366, O? sat 100%. Due to progressive hypercapnic and hypoxemic respiratory failure, she underwent rapid sequence intubation with etomidate  7.5 mg IV and rocuronium  50 mg IV.Post-intubation, she was placed on mechanical ventilation (PRVC, FiO? 40%, Rate 15, Vt 360 mL, PEEP 5).  Labs: WBC 9.5, neut 4.2, Hgb 13.2, Hct 42.2, Plt 215. BMP: Na 141, K 4.1, Cl 109, CO? 18, Glu 248, BUN 21, Cr 1.37 (eGFR 26), Ca 8.5. LFTs: AST 36, ALT 17, AlkPhos 129, TP 5.9, Alb 3.3, T Bili 0.2. INR 1.1. ABG (BiPAP/HiFiO?): pH 7.10, pCO? 59, HCO? 18, pO? 366, O?Sat 100%.  Imaging: CXR 08/13/24: Hyperexpanded lungs, R base patchy airspace disease (atelectasis vs infiltrate), L CP angle blunting stable, cardiac silhouette upper normal, L pacemaker/AICD in place.  Patient treated DuoNeb, LR 500 mL bolus, antibiotics (cefepime  2 g  IV, vanco 1 g IV, doxy 100  mg IV). Propofol  infusion for sedation. PCCM consulted for Admission to ICU.  08/14/24- on levo 10/vaso, doing SBT today.  On zosyn /doxy with positive tracheal aspirate cultures.  S/p Extubation.  08/16/24- Patient with no acute events overnight. [facility] has kindly taken over care of patient and PCCM will sign off and will be available if needed  Past Medical History  HIV (low CD4) COPD NICM/HFrEF s/p AICD Hypertension CKD stage III Cerebral aneurysm Prior TIA Myocardial infarction LBBB Hyperlipidemia Allergic rhinitis Anemia Moderate mitral regurgitation Pneumonia (Streptococcus) Nephrolithiasis/hydronephrosis Squamous cell carcinoma in situ (cervix) Thrombocytopenia Tobacco use  Significant Hospital Events   1/19:Admit to ICU with Acute hypercapnic/hypoxemic RF due to AECOPD requiring mechanical ventilation.  Consults:  ID  Procedures:  1/19: Endotrachael Intubation  Interim History / Subjective:    -see significant events  Micro Data:  08/13/24 SARS-CoV-2 PCR ? 08/13/24 Flu PCR ? 08/13/24 RVP ? 08/13/24 BCx2 ? 08/13/24 UCx ? 08/13/24 MRSA PCR ? 08/13/24 Strep pneumo Ur Ag ? 08/13/24 Legionella Ur Ag ?   Antimicrobials:   Anti-infectives (From admission, onward)    Start     Dose/Rate Route Frequency Ordered Stop   08/15/24 2200  dapsone  tablet 100 mg        100 mg Oral Daily at bedtime 08/15/24 1235     08/15/24 1000  doxycycline  (VIBRA -TABS) tablet 100 mg        100 mg Oral Every 12 hours 08/15/24 0753     08/14/24 1715  darunavir -cobicistat  (PREZCOBIX ) 800-150 MG per tablet 1 tablet  1 tablet Oral Daily with supper 08/14/24 1623     08/14/24 1715  rilpivirine  (EDURANT ) tablet 25 mg        25 mg Oral Daily with supper 08/14/24 1623     08/14/24 1400  piperacillin -tazobactam (ZOSYN ) IVPB 2.25 g  Status:  Discontinued        2.25 g 100 mL/hr over 30 Minutes Intravenous Every 8 hours 08/14/24 0750 08/15/24 1509   08/14/24  0600  ceFEPIme  (MAXIPIME ) 2 g in sodium chloride  0.9 % 100 mL IVPB  Status:  Discontinued        2 g 200 mL/hr over 30 Minutes Intravenous Every 24 hours 08/13/24 0745 08/13/24 1134   08/13/24 2200  doxycycline  (VIBRAMYCIN ) 100 mg in sodium chloride  0.9 % 250 mL IVPB  Status:  Discontinued        100 mg 125 mL/hr over 120 Minutes Intravenous Every 12 hours 08/13/24 1025 08/15/24 0753   08/13/24 1300  piperacillin -tazobactam (ZOSYN ) IVPB 3.375 g  Status:  Discontinued        3.375 g 12.5 mL/hr over 240 Minutes Intravenous Every 8 hours 08/13/24 1134 08/14/24 0750   08/13/24 0430  vancomycin  (VANCOCIN ) IVPB 1000 mg/200 mL premix        1,000 mg 200 mL/hr over 60 Minutes Intravenous  Once 08/13/24 0415 08/13/24 1002   08/13/24 0430  ceFEPIme  (MAXIPIME ) 2 g in sodium chloride  0.9 % 100 mL IVPB        2 g 200 mL/hr over 30 Minutes Intravenous  Once 08/13/24 0415 08/13/24 0627   08/13/24 0430  azithromycin (ZITHROMAX) 500 mg in sodium chloride  0.9 % 250 mL IVPB  Status:  Discontinued        500 mg 250 mL/hr over 60 Minutes Intravenous  Once 08/13/24 0415 08/13/24 0418   08/13/24 0430  doxycycline  (VIBRAMYCIN ) 100 mg in sodium chloride  0.9 % 250 mL IVPB        100 mg 125 mL/hr over 120 Minutes Intravenous  Once 08/13/24 0418 08/13/24 1136       OBJECTIVE  Blood pressure (!) 153/98, pulse (!) 107, temperature 98.2 F (36.8 C), resp. rate 16, weight 45 kg, SpO2 97%.    FiO2 (%):  [28 %] 28 %   Intake/Output Summary (Last 24 hours) at 08/16/2024 0837 Last data filed at 08/16/2024 0538 Gross per 24 hour  Intake 1873.37 ml  Output 800 ml  Net 1073.37 ml   Filed Weights   08/14/24 0500 08/15/24 0500 08/16/24 0440  Weight: 43.2 kg 44.6 kg 45 kg   Physical Examination  GEN: Critically ill patient, intubated and sedated HEENT: [place]/AT. PERRL, sclerae anicteric. HEART: regular rhythm, normal rate, S1, S2, no M/R/G,  LUNGS: CTAB, diminished bilateral without wheezes, no increased WOB,   EXTREMITIES: No Edema, cap refill  NEURO: No gross focal deficits. PSYCH:  UTA ABDOMINAL: Soft: BS x 4, NTND SKIN: Intact, warm, no rashes lesion, or ulcer  Active Hospital Problem list   See systems below  Assessment & Plan:  #Acute Hypoxic Hypercapnic Respiratory Failure iso  #AECOPD #CAP Hx of COPD; smoker  -Daily SAT/SBT when appropriate -Trend Xray & ABGs, target permissive hypercapnia -Nicotine  replacement therapy  #Suspected Pneumonia / Infection CXR: hyperinflation with subtle RLL patchy airspace disease Initial interventions/workup included: 2 L of NS/LR & cefepime , vancomycin , doxycycline  -F/u cultures, trend lactic/ PCT -Monitor WBC/ fever curve -Obtain MRSA nasal swab, RVP, legionella, strep urine Ag -Cont IV antibiotics De-escalate antibiotics as data returns -Gentle IVF hydration as  needed  #AKI on CKD stage III: AKI likely iso above Cr 1.37 (baseline CKD) -Follow BMP+Mag -Ensure adequate renal perfusion -Avoid nephrotoxic  -Replace lytes -strict I&O  #NICM/HFrEF s/p AICD  #s/p AICDHTNHLD Last Echo 01/2023 recovered EF >65%,  -Euvolemic on exam.  -BNP: 5944 -Chest XR (1/18): No pulmonary edema -Hold Home GDMT  -Strict I/Os -Obtain 2Decho -Hold Diuretics -Low threshold for cards consult  #Hyperglycemia Glucose 248 Likely stress and steroid related no hx of DM -Check A1c -CBG's q4hrs  -SSI -Target CBG readings 140 to 180 -Follow hypo/hyperglycemic protocol   #HIV/AIDS -Check Viral load , CD4 count -Per Last ID notes on prezcobix  and rilpavirine   -Consult ID for HIV hx -Plan to restart HARRT once able to tolerate p.o.  Best practice:  Diet:  NPO Pain/Anxiety/Delirium protocol (if indicated): Yes (RASS goal -1) VAP protocol (if indicated): Yes DVT prophylaxis: Subcutaneous Heparin  GI prophylaxis: H2B Glucose control:  SSI Yes Central venous access:  N/A Arterial line:  N/A Foley:  Yes, and it is still needed Mobility:  bed rest   PT/OT consulted: N/A Code Status:  full code Disposition: ICU   = Goals of Care =  Primary Emergency Contact: Clay,Tamicka, Home Phone: [phone]   Labs/imaging that I havepersonally reviewed  (right click and Reselect all SmartList Selections daily)   US  RENAL Result Date: 08/15/2024 CLINICAL DATA:  Acute renal insufficiency. EXAM: RENAL / URINARY TRACT ULTRASOUND COMPLETE COMPARISON:  CT 04/05/2024 FINDINGS: Right Kidney: Renal measurements: 9.2 x 5.5 x 4.2 cm = volume: 111 mL. Mild increased cortical echogenicity. Cortical thickness is normal. Mild fullness of the right intrarenal collecting system. 1.2 cm shadowing stone over the mid pole. Trace free fluid adjacent the upper pole. Left Kidney: Renal measurements: 9.1 x 5.2 x 4.1 cm = volume: 93 mL. Mild increased cortical echogenicity. Cortical thickness is normal. No significant hydronephrosis. 7 mm stone over the lower pole. Bladder: Appears normal for degree of bladder distention. Bilateral ureteral jets visualized. Other: None. IMPRESSION: 1. Normal size kidneys with mild increased cortical echogenicity as can be seen with medical renal disease. 2. Bilateral nephrolithiasis. Mild fullness of the right intrarenal collecting system. Electronically Signed   By: Toribio Agreste M.D.   On: 08/15/2024 09:42     Labs   CBC: Recent Labs  Lab 08/13/24 0411 08/14/24 0434 08/15/24 0346 08/16/24 0458  WBC 9.5 11.7*  11.5* 9.2 7.1  NEUTROABS 4.2 8.9*  --   --   HGB 13.2 12.2  12.5 11.4* 13.4  HCT 42.2 38.2  37.6 35.4* 41.3  MCV 93.4 90.5  90 90.8 89.2  PLT 215 187  184 141* 168    Basic Metabolic Panel: Recent Labs  Lab 08/13/24 0411 08/13/24 1714 08/14/24 0434 08/15/24 0346 08/16/24 0458  NA 141  --  139 141 144  K 4.1 4.7 4.9 5.0 5.1  CL 109  --  108 109 112*  CO2 18*  --  20* 21* 25  GLUCOSE 248*  --  101* 90 80  BUN 21*  --  37* 46* 52*  CREATININE 1.37*  --  2.76* 3.48* 4.02*  CALCIUM  8.5*  --  7.9* 8.4* 8.7*   MG  --  2.4 2.4 2.3 2.5*  PHOS  --   --  5.5* 5.8* 3.2   GFR: Estimated Creatinine Clearance: 10.8 mL/min (A) (by C-G formula based on SCr of 4.02 mg/dL (H)). Recent Labs  Lab 08/13/24 0411 08/13/24 0902 08/13/24 1108 08/13/24 2252 08/14/24 0434 08/15/24  9653 08/16/24 0458  WBC 9.5  --   --   --  11.7*  11.5* 9.2 7.1  LATICACIDVEN 5.0* 1.4 2.8* 2.7*  --   --   --     Liver Function Tests: Recent Labs  Lab 08/13/24 0411  AST 36  ALT 17  ALKPHOS 129*  BILITOT 0.2  PROT 5.9*  ALBUMIN 3.3*   No results for input(s): LIPASE, AMYLASE in the last 168 hours. No results for input(s): AMMONIA in the last 168 hours.  ABG    Component Value Date/Time   PHART 7.25 (L) 08/14/2024 0437   PCO2ART 44 08/14/2024 0437   PO2ART 95 08/14/2024 0437   HCO3 19.3 (L) 08/14/2024 0437   ACIDBASEDEF 7.7 (H) 08/14/2024 0437   O2SAT 98.4 08/14/2024 0437     Coagulation Profile: Recent Labs  Lab 08/13/24 0411  INR 1.1    Cardiac Enzymes: No results for input(s): CKTOTAL, CKMB, CKMBINDEX, TROPONINI in the last 168 hours.  HbA1C: Hgb A1c MFr Bld  Date/Time Value Ref Range Status  08/13/2024 04:11 AM 5.6 4.8 - 5.6 % Final    Comment:    (NOTE) Diagnosis of Diabetes The following HbA1c ranges recommended by the American Diabetes Association (ADA) may be used as an aid in the diagnosis of diabetes mellitus.  Hemoglobin             Suggested A1C NGSP%              Diagnosis  <5.7                   Non Diabetic  5.7-6.4                Pre-Diabetic  >6.4                   Diabetic  <7.0                   Glycemic control for                       adults with diabetes.      CBG: Recent Labs  Lab 08/15/24 1701 08/15/24 2029 08/16/24 0005 08/16/24 0418 08/16/24 0812  GLUCAP 95 100* 72 73 85    Review of Systems:   Unable to be obtained secondary to the patient's intubated and sedated status.   Past Medical History  She,  has a past medical history  of Allergic rhinitis, Anemia, Aortic insufficiency, Cerebral aneurysm (02/12/2023), Chronic systolic heart failure (HCC), CKD (chronic kidney disease), stage III (HCC), COPD (chronic obstructive pulmonary disease) (HCC), History of kidney stones, HIV (human immunodeficiency virus infection) (HCC) (2003), HLD (hyperlipidemia), Hydronephrosis, Hypertension, LBBB (left bundle branch block), MI (myocardial infarction) (HCC), Moderate mitral regurgitation, Nephrolithiasis, NICM (nonischemic cardiomyopathy) (HCC), Pneumonia due to Streptococcus, 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   Surgical History    Past Surgical History:  Procedure Laterality Date   BI-VENTRICULAR IMPLANTABLE CARDIOVERTER DEFIBRILLATOR  (CRT-D) Left 09/20/2017   Procedure: BI-VENTRICULAR IMPLANTABLE CARDIOVERTER DEFIBRILLATOR (CRT-D); Location: [facility]; Surgeon: Nancyann Ceo, MD   CHOLECYSTECTOMY     CYSTOSCOPY W/ URETERAL STENT PLACEMENT Left 06/10/2023   Procedure: CYSTOSCOPY WITH RETROGRADE PYELOGRAM/URETERAL STENT PLACEMENT;  Surgeon: Francisca Redell BROCKS, MD;  Location: [facility];  Service: Urology;  Laterality: Left;   CYSTOSCOPY/URETEROSCOPY/HOLMIUM LASER/STENT  PLACEMENT Left 07/05/2023   Procedure: CYSTOSCOPY/URETEROSCOPY/HOLMIUM LASER/STENT EXCHANGE;  Surgeon: Twylla Glendia BROCKS, MD;  Location: [facility];  Service: Urology;  Laterality: Left;     Social History   reports that she has been smoking cigarettes. She does not have any smokeless tobacco history on file. She reports that she does not drink alcohol and does not use drugs.   Family History   Her family history is not on file.   Allergies Allergies[1]   Home Medications  Prior to Admission medications  Medication Sig Start Date End Date Taking?  Authorizing Provider  albuterol  (VENTOLIN  HFA) 108 (90 Base) MCG/ACT inhaler Inhale 2 puffs into the lungs every 4 (four) hours as needed for wheezing or shortness of breath. 11/14/21   Cleotilde Rogue, MD  dapsone  100 MG tablet Take 100 mg by mouth at bedtime.    [provider]  darunavir -cobicistat  (PREZCOBIX ) 800-150 MG tablet Take 1 tablet by mouth at bedtime. Swallow whole. Do NOT crush, break or chew tablets. Take with food.    [provider]  ipratropium-albuterol  (DUONEB) 0.5-2.5 (3) MG/3ML SOLN Take 3 mLs by nebulization every 6 (six) hours as needed (Shortness of breath and wheezing). 12/05/23   Amin, Sumayya, MD  mirtazapine  (REMERON ) 15 MG tablet Take 15 mg by mouth at bedtime. 02/19/24   [provider]  rilpivirine  (EDURANT ) 25 MG TABS tablet Take 25 mg by mouth at bedtime.    [provider]  Tiotropium Bromide-Olodaterol (STIOLTO RESPIMAT) 2.5-2.5 MCG/ACT AERS Inhale 2 puffs into the lungs daily as needed (wheezing, shortness of breath).    [provider]   Scheduled Meds:  B-complex with vitamin C  1 tablet Oral Daily   calcium  carbonate  1 tablet Oral TID WC   dapsone   100 mg Oral QHS   darunavir -cobicistat   1 tablet Oral Q supper   doxycycline   100 mg Oral Q12H   feeding supplement  237 mL Oral BID BM   heparin   5,000 Units Subcutaneous Q8H   insulin  aspart  0-15 Units Subcutaneous Q4H   ipratropium-albuterol   3 mL Nebulization TID   mirtazapine   15 mg Oral QHS   multivitamin with minerals  1 tablet Oral Daily   polyethylene glycol  17 g Oral Daily   rilpivirine   25 mg Oral Q supper   senna  1 tablet Oral BID   thiamine   100 mg Oral Daily   Continuous Infusions:  lactated ringers  50 mL/hr at 08/16/24 0626   PRN Meds:.acetaminophen , alum & mag hydroxide-simeth, ipratropium-albuterol , menthol , ondansetron  (ZOFRAN ) IV, mouth rinse, phenol, polyethylene glycol, senna      Oda Lansdowne, M.D.  Pulmonary & Critical Care  Medicine             [1]  Allergies Allergen Reactions   Sulfonamide Derivatives Hives and Dermatitis   Tramadol Other (See Comments)    Upset stomach and it gave her the shakes    "

## 2024-08-16 NOTE — Progress Notes (Signed)
 Physical Therapy Treatment Patient Details Name: Claire Day MRN: [id number] DOB: [birth year] Today's Date: 08/16/2024   History of Present Illness Pt is a 59 yo female that presented to ED for acute respiratory distress. Initially placed on bipap but intubated and admitted to ICU (extubated 1/20). PMH of HIV (low CD4), COPD (not on home O2), active tobacco use, NICM with HFrEF s/p AICD, HTN, CKD III, cerebral aneurysm, prior TIA, recent Influenza A and COVID-19 infection    PT Comments  Patient alert, agreeable to PT, denied pain (besides a sore throat). Pt expressed frustration over current state and is motivated to do for herself, to be as independent as possible. Session focused on progressing activities and tolerance, as well as balance. Able to mobilize at a CGA-minA level, unsteadiness noted with any decrease in UE support (a few standing balance exercises performed, intermittent minA for posterior lean). She was able to ambulate bouts today with seated rest breaks. [address] at 2L donned due to desaturation to low 80s with activity. The patient would benefit from further skilled PT intervention to continue to progress towards goals, recommend intensive skilled PT to maximize independence and outcomes.     If plan is discharge home, recommend the following: A little help with walking and/or transfers;A little help with bathing/dressing/bathroom;Assistance with cooking/housework;Assist for transportation;Help with stairs or ramp for entrance   Can travel by private vehicle        Equipment Recommendations  Rolling walker (2 wheels);BSC/3in1    Recommendations for Other Services       Precautions / Restrictions Precautions Precautions: Fall Recall of Precautions/Restrictions: Intact Restrictions Weight Bearing Restrictions Per Provider Order: No     Mobility  Bed Mobility Overal bed mobility: Needs Assistance Bed Mobility: Supine to Sit     Supine to sit: HOB elevated, Used  rails, Contact guard     General bed mobility comments: extra time to complete    Transfers Overall transfer level: Needs assistance Equipment used: Rolling walker (2 wheels) Transfers: Sit to/from Stand Sit to Stand: Min assist, Contact guard assist           General transfer comment: minA for steadying, verbal cues for tehcnique each time    Ambulation/Gait Ambulation/Gait assistance: Min assist, Contact guard assist Gait Distance (Feet):  (41ft then seated rest break and donned O2, 32ft then seated rest break) Assistive device: Rolling walker (2 wheels)         General Gait Details: very slow, almost tandem walking pattern, fatigue noticable. general unsteadiness but no LOB today   Stairs             Wheelchair Mobility     Tilt Bed    Modified Rankin (Stroke Patients Only)       Balance Overall balance assessment: Needs assistance Sitting-balance support: Feet supported, No upper extremity supported Sitting balance-Leahy Scale: Good     Standing balance support: Bilateral upper extremity supported, During functional activity, Reliant on assistive device for balance, No upper extremity supported Standing balance-Leahy Scale: Fair Standing balance comment: a few standing balance exercises without UE support. intermittent minA to maintain balance. (FT EO, FT EC, one step forward)                            Communication Communication Communication: No apparent difficulties  Cognition Arousal: Alert Behavior During Therapy: WFL for tasks assessed/performed  Following commands: Intact      Cueing Cueing Techniques: Verbal cues  Exercises      General Comments        Pertinent Vitals/Pain Pain Assessment Pain Assessment: No/denies pain    Home Living                          Prior Function            PT Goals (current goals can now be found in the care plan section)  Progress towards PT goals: Progressing toward goals    Frequency    Min 3X/week      PT Plan      Co-evaluation              AM-PAC PT 6 Clicks Mobility   Outcome Measure  Help needed turning from your back to your side while in a flat bed without using bedrails?: A Little Help needed moving from lying on your back to sitting on the side of a flat bed without using bedrails?: A Little Help needed moving to and from a bed to a chair (including a wheelchair)?: A Little Help needed standing up from a chair using your arms (e.g., wheelchair or bedside chair)?: A Little Help needed to walk in hospital room?: A Little Help needed climbing 3-5 steps with a railing? : A Little 6 Click Score: 18    End of Session Equipment Utilized During Treatment: Gait belt Activity Tolerance: Patient tolerated treatment well Patient left: in chair;with call bell/phone within reach;with chair alarm set Nurse Communication: Mobility status PT Visit Diagnosis: Other abnormalities of gait and mobility (R26.89);Difficulty in walking, not elsewhere classified (R26.2);Muscle weakness (generalized) (M62.81)     Time: 1340-1416 PT Time Calculation (min) (ACUTE ONLY): 36 min  Charges:    $Therapeutic Activity: 8-22 mins $Neuromuscular Re-education: 8-22 mins PT General Charges $$ ACUTE PT VISIT: 1 Visit                     Doyal Shams PT, DPT 3:58 PM,08/16/24

## 2024-08-16 NOTE — Plan of Care (Signed)
" °  Problem: Health Behavior/Discharge Planning: Goal: Ability to identify and utilize available resources and services will improve Outcome: Progressing   Problem: Nutritional: Goal: Maintenance of adequate nutrition will improve Outcome: Progressing   Problem: Skin Integrity: Goal: Risk for impaired skin integrity will decrease Outcome: Progressing   Problem: Tissue Perfusion: Goal: Adequacy of tissue perfusion will improve Outcome: Progressing   Problem: Education: Goal: Knowledge of General Education information will improve Description: Including pain rating scale, medication(s)/side effects and non-pharmacologic comfort measures Outcome: Progressing   Problem: Clinical Measurements: Goal: Ability to maintain clinical measurements within normal limits will improve Outcome: Progressing Goal: Will remain free from infection Outcome: Progressing Goal: Diagnostic test results will improve Outcome: Progressing Goal: Respiratory complications will improve Outcome: Progressing Goal: Cardiovascular complication will be avoided Outcome: Progressing   Problem: Activity: Goal: Risk for activity intolerance will decrease Outcome: Progressing   Problem: Coping: Goal: Level of anxiety will decrease Outcome: Progressing   Problem: Elimination: Goal: Will not experience complications related to urinary retention Outcome: Progressing   Problem: Safety: Goal: Ability to remain free from injury will improve Outcome: Progressing   Problem: Skin Integrity: Goal: Risk for impaired skin integrity will decrease Outcome: Progressing   "

## 2024-08-16 NOTE — Progress Notes (Signed)
 "  Rounding Note   Patient Name: Claire Day Date of Encounter: 08/16/2024  [facility] Cardiologist: [facility]  Subjective Sitting up in bed, no complaints, Soft-spoken, reports she did not sleep well Renal ultrasound reviewed with her detailing bilateral nephrolithiasis, medical renal disease Creatinine continues to trend upwards  Scheduled Meds:  B-complex with vitamin C  1 tablet Oral Daily   calcium  carbonate  1 tablet Oral TID WC   dapsone   100 mg Oral QHS   darunavir -cobicistat   1 tablet Oral Q supper   doxycycline   100 mg Oral Q12H   feeding supplement  237 mL Oral BID BM   heparin   5,000 Units Subcutaneous Q8H   insulin  aspart  0-15 Units Subcutaneous Q4H   ipratropium-albuterol   3 mL Nebulization TID   isosorbide -hydrALAZINE   1 tablet Oral TID   mirtazapine   15 mg Oral QHS   multivitamin with minerals  1 tablet Oral Daily   polyethylene glycol  17 g Oral Daily   rilpivirine   25 mg Oral Q supper   senna  1 tablet Oral BID   thiamine   100 mg Oral Daily   Continuous Infusions:  PRN Meds: acetaminophen , alum & mag hydroxide-simeth, ipratropium-albuterol , menthol , ondansetron  (ZOFRAN ) IV, mouth rinse, phenol, polyethylene glycol, senna   Vital Signs  Vitals:   08/16/24 0424 08/16/24 0440 08/16/24 0752 08/16/24 0813  BP: (!) 140/97   (!) 153/98  Pulse: (!) 102   (!) 107  Resp:    16  Temp: 98.7 F (37.1 C)   98.2 F (36.8 C)  TempSrc: Oral     SpO2: 100%  95% 97%  Weight:  45 kg      Intake/Output Summary (Last 24 hours) at 08/16/2024 1202 Last data filed at 08/16/2024 0538 Gross per 24 hour  Intake 1873.37 ml  Output 800 ml  Net 1073.37 ml      08/16/2024    4:40 AM 08/15/2024    5:00 AM 08/14/2024    5:00 AM  Last 3 Weights  Weight (lbs) 99 lb 3.3 oz 98 lb 5.2 oz 95 lb 3.8 oz  Weight (kg) 45 kg 44.6 kg 43.2 kg      Telemetry Normal sinus rhythm- Personally Reviewed  ECG   - Personally Reviewed  Physical Exam  GEN: No acute  distress.  Very thin Neck: No JVD Cardiac: RRR, no murmurs, rubs, or gallops.  Respiratory: Clear to auscultation bilaterally. GI: Soft, nontender, non-distended  MS: No edema; No deformity. Neuro:  Nonfocal  Psych: Normal affect   Labs High Sensitivity Troponin:  No results for input(s): TROPONINIHS in the last 720 hours.  Recent Labs  Lab 08/13/24 0855 08/13/24 1714 08/13/24 2150 08/14/24 2155 08/14/24 2358  TRNPT 110* 123* 126* 132* 129*       Chemistry Recent Labs  Lab 08/13/24 0411 08/13/24 1714 08/14/24 0434 08/15/24 0346 08/16/24 0458  NA 141  --  139 141 144  K 4.1   < > 4.9 5.0 5.1  CL 109  --  108 109 112*  CO2 18*  --  20* 21* 25  GLUCOSE 248*  --  101* 90 80  BUN 21*  --  37* 46* 52*  CREATININE 1.37*  --  2.76* 3.48* 4.02*  CALCIUM  8.5*  --  7.9* 8.4* 8.7*  MG  --    < > 2.4 2.3 2.5*  PROT 5.9*  --   --   --   --   ALBUMIN 3.3*  --   --   --   --  AST 36  --   --   --   --   ALT 17  --   --   --   --   ALKPHOS 129*  --   --   --   --   BILITOT 0.2  --   --   --   --   GFRNONAA 45*  --  19* 15* 12*  ANIONGAP 14  --  11 10 7    < > = values in this interval not displayed.    Lipids No results for input(s): CHOL, TRIG, HDL, LABVLDL, LDLCALC, CHOLHDL in the last 168 hours.  Hematology Recent Labs  Lab 08/14/24 0434 08/15/24 0346 08/16/24 0458  WBC 11.7*  11.5* 9.2 7.1  RBC 4.19  4.22 3.90 4.63  HGB 12.2  12.5 11.4* 13.4  HCT 38.2  37.6 35.4* 41.3  MCV 90.5  90 90.8 89.2  MCH 29.8  28.9 29.2 28.9  MCHC 33.2  31.9 32.2 32.4  RDW 13.2  14.5 15.0 15.4  PLT 187  184 141* 168   Thyroid No results for input(s): TSH, FREET4 in the last 168 hours.  BNP Recent Labs  Lab 08/13/24 0509  PROBNP 5,944.0*    DDimer No results for input(s): DDIMER in the last 168 hours.   Radiology  US  RENAL Result Date: 08/15/2024 CLINICAL DATA:  Acute renal insufficiency. EXAM: RENAL / URINARY TRACT ULTRASOUND COMPLETE COMPARISON:  CT  04/05/2024 FINDINGS: Right Kidney: Renal measurements: 9.2 x 5.5 x 4.2 cm = volume: 111 mL. Mild increased cortical echogenicity. Cortical thickness is normal. Mild fullness of the right intrarenal collecting system. 1.2 cm shadowing stone over the mid pole. Trace free fluid adjacent the upper pole. Left Kidney: Renal measurements: 9.1 x 5.2 x 4.1 cm = volume: 93 mL. Mild increased cortical echogenicity. Cortical thickness is normal. No significant hydronephrosis. 7 mm stone over the lower pole. Bladder: Appears normal for degree of bladder distention. Bilateral ureteral jets visualized. Other: None. IMPRESSION: 1. Normal size kidneys with mild increased cortical echogenicity as can be seen with medical renal disease. 2. Bilateral nephrolithiasis. Mild fullness of the right intrarenal collecting system. Electronically Signed   By: Toribio Agreste M.D.   On: 08/15/2024 09:42   Cardiac Studies   Patient Profile   Ms. Claire Day is a 59 year old woman with history of nonischemic cardiomyopathy, ICD 2019, chronic systolic CHF, HIV, COPD, smoker who presents August 13, 2024 with shortness of breath, altered, cardiology consulted for cardiomyopathy  Assessment & Plan  Acute on chronic HFrEF Long history of NICM s/p ICD Cardiomyopathy dating back more than 5 years, EF as low as 25 to 30%  most recent echo 01/2023 showing  EF 63% -Echo this admission with drop in EF back to 25 to 30%, global hypokinesis - proBNP 5944, up from last admission 07/25/2024 with proBNP 3282  - Chest x-ray without pulmonary edema and effusion -IV fluids started by medical team for renal failure, though no improvement in renal function after 24 hours -Low threshold to stop IV fluids for any worsening shortness of breath -Appears mildly short of breath on exam, concerning for CHF, will avoid addition of beta-blockers for now - Recommend we start low-dose BiDil  - No plans for ischemic workup at this time, prior cardiac CTA with  no coronary disease  Acute hypoxic hypercapnic respiratory failure,COPD Pneumonia - Initially requiring BiPAP, secondary to worsening respiratory status requiring intubation.  Extubated 1/20.   - More short of breath this morning, low  threshold to hold IV fluids and initiate Lasix   AKI - Creatinine 1.37 on admission, trending up to 3.48 today, likely ATN in the setting of tachycardia and hypotension in the setting of induction for intubation -Medical renal disease on ultrasound - Consider involving nephrology    For questions or updates, please contact [facility] Please consult [email or web address] for contact info under     Signed, Shaaron Golliday, MD  08/16/2024, 12:02 PM    "

## 2024-08-16 NOTE — Progress Notes (Signed)
 " PROGRESS NOTE    Claire Day  FMW:[id number] DOB: July 08, 1966 DOA: 08/13/2024 PCP: Meade Bigness, MD  Subjective: No acute events overnight. Seen and examined at bedside. Reports feeling better. No new complaints. Reports having poor appetite and hasn't had breakfast. Denies nausea, vomiting, constipation.   Hospital Course:  59 year old female with PMH significant for HIV (low CD4), COPD (not on home O?), active tobacco use, NICM with HFrEF s/p AICD, HTN, CKD III, cerebral aneurysm, prior TIA, recent Influenza A and COVID-19 infection presenting to the ED with complaints of acute respiratory distress.   On chart review patient with recent admission (07/25/24-07/27/24) for acute respiratory failure secondary to AECOPD, Influenza A, and COVID-19, treated with steroids, antivirals, antibiotics, and BiPAP, discharged on room air. She re-presented today via EMS with severe shortness of breath. On EMS arrival, she was tripoding and speaking in short sentences and was placed on BiPAP, receiving IV magnesium  and Solu-Medrol  125 mg en route. Patient treated DuoNeb, LR 500 mL bolus, antibiotics (cefepime  2 g IV, vanco 1 g IV, doxy 100  mg IV). Propofol  infusion for sedation. Intubated for mechanical ventilation. Admitted to ICU.    Patient treated with IV antibiotics, mechanical ventilation and vasopressors. Extubated and weaned off pressors and transferred to medicine floor.    Assessment and Plan:  Acute Hypoxic Hypercapnic Respiratory Failure iso  #AECOPD #CAP Hx of COPD; smoker  - status post mechanical ventilation. Extubated 1/20 - currently on room air - cont duonebs - hold off on steroids for now -Nicotine  replacement therapy   Suspected Pneumonia  CXR: hyperinflation with subtle RLL patchy airspace disease Initial interventions/workup included: 2 L of NS/LR & cefepime , vancomycin , doxycycline  - covid/influenza/RSV negative - resp viral panel negative - MRSA screen negative - strep  urine Ag negative - resp Cx with few GPCs, final read pending - Bcx 1/19 showed NGTD, final read pending -Cont doxycycline , plan for 5 day course to end 1/23 - ID following   #AKI on CKD stage III Bilateral nephrolithiasis Cr 4.02 < 1.37, baseline Cr 1-1.3 - etiology unclear, likely in the setting of severe sepsis/septic shock requiring vasopressors - FeNa 11.7% concerning for intrinsic renal disease - US  renal showed medical renal disease, bilateral nephrolithiasis (1.2cm R mid pole and 0.7cm L lower pole), mild fullness of R intrarenal collecting system - status post gentle IV fluids over past 24 hours  - hold off on further IV fluids for now - renal dose meds -Avoid nephrotoxic agents -Follow I/Os, renal function - consider nephrology consult if continues to worsen   #NICM/HFrEF s/p AICD  #s/p AICD #HTN #HLD Last Echo 01/2023 recovered EF >65%,  -Euvolemic on exam.  -BNP: 5944 -Chest XR (1/18): No pulmonary edema -2Decho 1/19 showed LVEF 25-30%, global hypokinesis, mild LVH, grade 1 diastolic dysfunction -Hold Home GDMT for now -Hold Diuretics for now - status post gentle IV fluids over past 24 hours given worsening AKI -Strict I/Os - Cardiology following    Hypoglycemia Severe malnutrition Poor oral intake Noted to have Hyperglycemia earlier during MICU stay secondary to stress steroids with no hx of DM - HgbA1c 5.6 - encourage oral intake/hydration - resume home mirtazapine  - dietary supplements as per dietician recs - cont SSI with FS ACHS - Follow hypo/hyperglycemic protocol    #HIV/AIDS # PCP Pneumonia -Check Viral load , CD4 count -Per Last ID notes on prezcobix  and rilpavirine   - cont home darunavir -cobicistat  and rilpivirine  - cont home dapsone  -ID following  DVT prophylaxis: heparin  injection  5,000 Units Start: 08/13/24 0600  SQ Heparin    Code Status: Full Code  Disposition Plan: acute rehab Reason for continuing need for hospitalization:  severity of illness  Objective: Vitals:   08/16/24 0424 08/16/24 0440 08/16/24 0752 08/16/24 0813  BP: (!) 140/97   (!) 153/98  Pulse: (!) 102   (!) 107  Resp:    16  Temp: 98.7 F (37.1 C)   98.2 F (36.8 C)  TempSrc: Oral     SpO2: 100%  95% 97%  Weight:  45 kg      Intake/Output Summary (Last 24 hours) at 08/16/2024 1250 Last data filed at 08/16/2024 0538 Gross per 24 hour  Intake 1708.87 ml  Output 800 ml  Net 908.87 ml   Filed Weights   08/14/24 0500 08/15/24 0500 08/16/24 0440  Weight: 43.2 kg 44.6 kg 45 kg    Examination:  Physical Exam Vitals and nursing note reviewed.  Constitutional:      General: She is not in acute distress.    Appearance: She is ill-appearing.     Comments: Weak, frail, cachectic  HENT:     Head: Normocephalic and atraumatic.  Cardiovascular:     Rate and Rhythm: Normal rate and regular rhythm.     Pulses: Normal pulses.     Heart sounds: Normal heart sounds.  Pulmonary:     Effort: Pulmonary effort is normal.     Breath sounds: Normal breath sounds.  Abdominal:     General: Bowel sounds are normal.     Palpations: Abdomen is soft.  Musculoskeletal:     Right lower leg: No edema.     Left lower leg: No edema.  Neurological:     Mental Status: She is alert. Mental status is at baseline.     Data Reviewed: I have personally reviewed following labs and imaging studies  CBC: Recent Labs  Lab 08/13/24 0411 08/14/24 0434 08/15/24 0346 08/16/24 0458  WBC 9.5 11.7*  11.5* 9.2 7.1  NEUTROABS 4.2 8.9*  --   --   HGB 13.2 12.2  12.5 11.4* 13.4  HCT 42.2 38.2  37.6 35.4* 41.3  MCV 93.4 90.5  90 90.8 89.2  PLT 215 187  184 141* 168   Basic Metabolic Panel: Recent Labs  Lab 08/13/24 0411 08/13/24 1714 08/14/24 0434 08/15/24 0346 08/16/24 0458  NA 141  --  139 141 144  K 4.1 4.7 4.9 5.0 5.1  CL 109  --  108 109 112*  CO2 18*  --  20* 21* 25  GLUCOSE 248*  --  101* 90 80  BUN 21*  --  37* 46* 52*  CREATININE 1.37*   --  2.76* 3.48* 4.02*  CALCIUM  8.5*  --  7.9* 8.4* 8.7*  MG  --  2.4 2.4 2.3 2.5*  PHOS  --   --  5.5* 5.8* 3.2   GFR: Estimated Creatinine Clearance: 10.8 mL/min (A) (by C-G formula based on SCr of 4.02 mg/dL (H)). Liver Function Tests: Recent Labs  Lab 08/13/24 0411  AST 36  ALT 17  ALKPHOS 129*  BILITOT 0.2  PROT 5.9*  ALBUMIN 3.3*   No results for input(s): LIPASE, AMYLASE in the last 168 hours. No results for input(s): AMMONIA in the last 168 hours. Coagulation Profile: Recent Labs  Lab 08/13/24 0411  INR 1.1   Cardiac Enzymes: No results for input(s): CKTOTAL, CKMB, CKMBINDEX, TROPONINI in the last 168 hours. ProBNP, BNP (last 5 results) Recent Labs  12/02/23 0907 07/25/24 1516 08/13/24 0509  PROBNP  --  3,282.0* 5,944.0*  BNP 105.0*  --   --    HbA1C: No results for input(s): HGBA1C in the last 72 hours. CBG: Recent Labs  Lab 08/15/24 2029 08/16/24 0005 08/16/24 0418 08/16/24 0812 08/16/24 1128  GLUCAP 100* 72 73 85 68*   Lipid Profile: No results for input(s): CHOL, HDL, LDLCALC, TRIG, CHOLHDL, LDLDIRECT in the last 72 hours. Thyroid Function Tests: No results for input(s): TSH, T4TOTAL, FREET4, T3FREE, THYROIDAB in the last 72 hours. Anemia Panel: No results for input(s): VITAMINB12, FOLATE, FERRITIN, TIBC, IRON, RETICCTPCT in the last 72 hours. Sepsis Labs: Recent Labs  Lab 08/13/24 0411 08/13/24 0902 08/13/24 1108 08/13/24 2252  LATICACIDVEN 5.0* 1.4 2.8* 2.7*    Recent Results (from the past 240 hours)  Resp panel by RT-PCR (RSV, Flu A&B, Covid) Anterior Nasal Swab     Status: None   Collection Time: 08/13/24  4:11 AM   Specimen: Anterior Nasal Swab  Result Value Ref Range Status   SARS Coronavirus 2 by RT PCR NEGATIVE NEGATIVE Final    Comment: (NOTE) SARS-CoV-2 target nucleic acids are NOT DETECTED.  The SARS-CoV-2 RNA is generally detectable in upper respiratory specimens  during the acute phase of infection. The lowest concentration of SARS-CoV-2 viral copies this assay can detect is 138 copies/mL. A negative result does not preclude SARS-Cov-2 infection and should not be used as the sole basis for treatment or other patient management decisions. A negative result may occur with  improper specimen collection/handling, submission of specimen other than nasopharyngeal swab, presence of viral mutation(s) within the areas targeted by this assay, and inadequate number of viral copies(<138 copies/mL). A negative result must be combined with clinical observations, patient history, and epidemiological information. The expected result is Negative.  Fact Sheet for Patients:  bloggercourse.com  Fact Sheet for Healthcare Providers:  seriousbroker.it  This test is no t yet approved or cleared by the [place]  FDA and  has been authorized for detection and/or diagnosis of SARS-CoV-2 by FDA under an Emergency Use Authorization (EUA). This EUA will remain  in effect (meaning this test can be used) for the duration of the COVID-19 declaration under Section 564(b)(1) of the Act, 21 U.S.C.section 360bbb-3(b)(1), unless the authorization is terminated  or revoked sooner.       Influenza A by PCR NEGATIVE NEGATIVE Final   Influenza B by PCR NEGATIVE NEGATIVE Final    Comment: (NOTE) The Xpert Xpress SARS-CoV-2/FLU/RSV plus assay is intended as an aid in the diagnosis of influenza from Nasopharyngeal swab specimens and should not be used as a sole basis for treatment. Nasal washings and aspirates are unacceptable for Xpert Xpress SARS-CoV-2/FLU/RSV testing.  Fact Sheet for Patients: bloggercourse.com  Fact Sheet for Healthcare Providers: seriousbroker.it  This test is not yet approved or cleared by the [place]  FDA and has been authorized for detection  and/or diagnosis of SARS-CoV-2 by FDA under an Emergency Use Authorization (EUA). This EUA will remain in effect (meaning this test can be used) for the duration of the COVID-19 declaration under Section 564(b)(1) of the Act, 21 U.S.C. section 360bbb-3(b)(1), unless the authorization is terminated or revoked.     Resp Syncytial Virus by PCR NEGATIVE NEGATIVE Final    Comment: (NOTE) Fact Sheet for Patients: bloggercourse.com  Fact Sheet for Healthcare Providers: seriousbroker.it  This test is not yet approved or cleared by the [place]  FDA and has been authorized for detection and/or diagnosis  of SARS-CoV-2 by FDA under an Emergency Use Authorization (EUA). This EUA will remain in effect (meaning this test can be used) for the duration of the COVID-19 declaration under Section 564(b)(1) of the Act, 21 U.S.C. section 360bbb-3(b)(1), unless the authorization is terminated or revoked.  Performed at [facility], [address] [place], [place] [address]   Blood Culture (routine x 2)     Status: None (Preliminary result)   Collection Time: 08/13/24  4:11 AM   Specimen: BLOOD  Result Value Ref Range Status   Specimen Description BLOOD BLOOD LEFT FOREARM  Final   Special Requests   Final    BOTTLES DRAWN AEROBIC AND ANAEROBIC Blood Culture adequate volume   Culture   Final    NO GROWTH 3 DAYS Performed at [facility], [address], [place], [place] [address]    Report Status PENDING  Incomplete  Blood Culture (routine x 2)     Status: None (Preliminary result)   Collection Time: 08/13/24  4:11 AM   Specimen: BLOOD  Result Value Ref Range Status   Specimen Description BLOOD LEFT ANTECUBITAL  Final   Special Requests   Final    BOTTLES DRAWN AEROBIC AND ANAEROBIC Blood Culture adequate volume   Culture   Final    NO GROWTH 3 DAYS Performed at [facility], [address] [place],  [place] [address]    Report Status PENDING  Incomplete  Urine Culture     Status: Abnormal   Collection Time: 08/13/24  4:11 AM   Specimen: Urine, Random  Result Value Ref Range Status   Specimen Description   Final    URINE, RANDOM Performed at [facility], [address], [place], [place] [address]    Special Requests   Final    URINE, CLEAN CATCH Performed at [facility], [address], [place], [place] [address]    Culture >=100,000 COLONIES/mL ESCHERICHIA COLI (A)  Final   Report Status 08/15/2024 FINAL  Final   Organism ID, Bacteria ESCHERICHIA COLI (A)  Final      Susceptibility   Escherichia coli - MIC*    AMPICILLIN <=2 SENSITIVE Sensitive     CEFAZOLIN (URINE) Value in next row Sensitive      4 SENSITIVEThis is a modified FDA-approved test that has been validated and its performance characteristics determined by the reporting laboratory.  This laboratory is certified under the Clinical Laboratory Improvement Amendments CLIA as qualified to perform high complexity clinical laboratory testing.    CEFEPIME  Value in next row Sensitive      4 SENSITIVEThis is a modified FDA-approved test that has been validated and its performance characteristics determined by the reporting laboratory.  This laboratory is certified under the Clinical Laboratory Improvement Amendments CLIA as qualified to perform high complexity clinical laboratory testing.    ERTAPENEM Value in next row Sensitive      4 SENSITIVEThis is a modified FDA-approved test that has been validated and its performance characteristics determined by the reporting laboratory.  This laboratory is certified under the Clinical Laboratory Improvement Amendments CLIA as qualified to perform high complexity clinical laboratory testing.    CEFTRIAXONE  Value in next row Sensitive      4 SENSITIVEThis is a modified FDA-approved test that has been validated and its performance characteristics determined by the reporting  laboratory.  This laboratory is certified under the Clinical Laboratory Improvement Amendments CLIA as qualified to perform high complexity clinical laboratory testing.  CIPROFLOXACIN Value in next row Sensitive      4 SENSITIVEThis is a modified FDA-approved test that has been validated and its performance characteristics determined by the reporting laboratory.  This laboratory is certified under the Clinical Laboratory Improvement Amendments CLIA as qualified to perform high complexity clinical laboratory testing.    GENTAMICIN Value in next row Sensitive      4 SENSITIVEThis is a modified FDA-approved test that has been validated and its performance characteristics determined by the reporting laboratory.  This laboratory is certified under the Clinical Laboratory Improvement Amendments CLIA as qualified to perform high complexity clinical laboratory testing.    NITROFURANTOIN Value in next row Sensitive      4 SENSITIVEThis is a modified FDA-approved test that has been validated and its performance characteristics determined by the reporting laboratory.  This laboratory is certified under the Clinical Laboratory Improvement Amendments CLIA as qualified to perform high complexity clinical laboratory testing.    TRIMETH/SULFA Value in next row Resistant      4 SENSITIVEThis is a modified FDA-approved test that has been validated and its performance characteristics determined by the reporting laboratory.  This laboratory is certified under the Clinical Laboratory Improvement Amendments CLIA as qualified to perform high complexity clinical laboratory testing.    AMPICILLIN/SULBACTAM Value in next row Sensitive      4 SENSITIVEThis is a modified FDA-approved test that has been validated and its performance characteristics determined by the reporting laboratory.  This laboratory is certified under the Clinical Laboratory Improvement Amendments CLIA as qualified to perform high complexity clinical laboratory  testing.    PIP/TAZO Value in next row Sensitive      <=4 SENSITIVEThis is a modified FDA-approved test that has been validated and its performance characteristics determined by the reporting laboratory.  This laboratory is certified under the Clinical Laboratory Improvement Amendments CLIA as qualified to perform high complexity clinical laboratory testing.    MEROPENEM Value in next row Sensitive      <=4 SENSITIVEThis is a modified FDA-approved test that has been validated and its performance characteristics determined by the reporting laboratory.  This laboratory is certified under the Clinical Laboratory Improvement Amendments CLIA as qualified to perform high complexity clinical laboratory testing.    * >=100,000 COLONIES/mL ESCHERICHIA COLI  MRSA Next Gen by PCR, Nasal     Status: None   Collection Time: 08/13/24  6:30 AM   Specimen: Nasal Mucosa; Nasal Swab  Result Value Ref Range Status   MRSA by PCR Next Gen NOT DETECTED NOT DETECTED Final    Comment: (NOTE) The GeneXpert MRSA Assay (FDA approved for NASAL specimens only), is one component of a comprehensive MRSA colonization surveillance program. It is not intended to diagnose MRSA infection nor to guide or monitor treatment for MRSA infections. Test performance is not FDA approved in patients less than 64 years old. Performed at [facility], [address] [place], [place] [address]   Respiratory (~20 pathogens) panel by PCR     Status: None   Collection Time: 08/13/24  6:43 AM   Specimen: Nasopharyngeal Swab; Respiratory  Result Value Ref Range Status   Adenovirus NOT DETECTED NOT DETECTED Final   Coronavirus 229E NOT DETECTED NOT DETECTED Final    Comment: (NOTE) The Coronavirus on the Respiratory Panel, DOES NOT test for the novel  Coronavirus (2019 nCoV)    Coronavirus HKU1 NOT DETECTED NOT DETECTED Final   Coronavirus NL63 NOT DETECTED NOT DETECTED Final   Coronavirus OC43 NOT  DETECTED NOT DETECTED Final    Metapneumovirus NOT DETECTED NOT DETECTED Final   Rhinovirus / Enterovirus NOT DETECTED NOT DETECTED Final   Influenza A NOT DETECTED NOT DETECTED Final   Influenza B NOT DETECTED NOT DETECTED Final   Parainfluenza Virus 1 NOT DETECTED NOT DETECTED Final   Parainfluenza Virus 2 NOT DETECTED NOT DETECTED Final   Parainfluenza Virus 3 NOT DETECTED NOT DETECTED Final   Parainfluenza Virus 4 NOT DETECTED NOT DETECTED Final   Respiratory Syncytial Virus NOT DETECTED NOT DETECTED Final   Bordetella pertussis NOT DETECTED NOT DETECTED Final   Bordetella Parapertussis NOT DETECTED NOT DETECTED Final   Chlamydophila pneumoniae NOT DETECTED NOT DETECTED Final   Mycoplasma pneumoniae NOT DETECTED NOT DETECTED Final    Comment: Performed at [facility], [address], [place], [place] [address]  Culture, Respiratory w Gram Stain     Status: None   Collection Time: 08/13/24  3:54 PM   Specimen: Tracheal Aspirate; Respiratory  Result Value Ref Range Status   Specimen Description   Final    TRACHEAL ASPIRATE Performed at [facility], [address] [place], [place] [address]    Special Requests   Final    NONE Performed at [facility], [address] [place], [place] [address]    Gram Stain   Final    FEW WBC PRESENT, PREDOMINANTLY PMN FEW GRAM POSITIVE COCCI    Culture   Final    FEW Normal respiratory flora-no Staph aureus or Pseudomonas seen Performed at [facility], [address], [place], [place] [address]    Report Status 08/16/2024 FINAL  Final     Radiology Studies: US  RENAL Result Date: 08/15/2024 CLINICAL DATA:  Acute renal insufficiency. EXAM: RENAL / URINARY TRACT ULTRASOUND COMPLETE COMPARISON:  CT 04/05/2024 FINDINGS: Right Kidney: Renal measurements: 9.2 x 5.5 x 4.2 cm = volume: 111 mL. Mild increased cortical echogenicity. Cortical thickness is normal. Mild fullness of the right intrarenal collecting system. 1.2 cm shadowing stone  over the mid pole. Trace free fluid adjacent the upper pole. Left Kidney: Renal measurements: 9.1 x 5.2 x 4.1 cm = volume: 93 mL. Mild increased cortical echogenicity. Cortical thickness is normal. No significant hydronephrosis. 7 mm stone over the lower pole. Bladder: Appears normal for degree of bladder distention. Bilateral ureteral jets visualized. Other: None. IMPRESSION: 1. Normal size kidneys with mild increased cortical echogenicity as can be seen with medical renal disease. 2. Bilateral nephrolithiasis. Mild fullness of the right intrarenal collecting system. Electronically Signed   By: Toribio Agreste M.D.   On: 08/15/2024 09:42    Scheduled Meds:  B-complex with vitamin C  1 tablet Oral Daily   calcium  carbonate  1 tablet Oral TID WC   dapsone   100 mg Oral QHS   darunavir -cobicistat   1 tablet Oral Q supper   doxycycline   100 mg Oral Q12H   feeding supplement  237 mL Oral BID BM   heparin   5,000 Units Subcutaneous Q8H   insulin  aspart  0-15 Units Subcutaneous Q4H   ipratropium-albuterol   3 mL Nebulization TID   isosorbide -hydrALAZINE   1 tablet Oral TID   mirtazapine   15 mg Oral QHS   multivitamin with minerals  1 tablet Oral Daily   polyethylene glycol  17 g Oral Daily   rilpivirine   25 mg Oral Q supper   senna  1 tablet Oral BID   thiamine   100 mg Oral Daily   Continuous Infusions:  LOS: 3 days   Norval Bar, MD  [facility]  08/16/2024, 12:50 PM   "

## 2024-08-16 NOTE — Plan of Care (Signed)

## 2024-08-16 NOTE — Progress Notes (Signed)
 Inpatient Rehab Admissions Coordinator:   Left voicemail for pt's daughter to discuss rehab recommendations.  Note some SOB and worsening kidney function.  Potential for nephrology consult, IVF on hold.  We will continue to follow.    Reche Lowers, PT, DPT Admissions Coordinator [phone] 08/16/24 1:40 PM

## 2024-08-16 NOTE — TOC Progression Note (Signed)
 Transition of Care [facility]) - Progression Note    Patient Details  Name: Claire Day MRN: [id number] Date of Birth: 01-12-1966  Transition of Care [facility]) CM/SW Contact  Corean ONEIDA Haddock, RN Phone Number: 08/16/2024, 11:37 AM  Clinical Narrative:      Therapy recommending AIR.  Per [facility] Admissions coordinator placed a rehab consult per protocol for full assessment.                    Expected Discharge Plan and Services                                               Social Drivers of Health (SDOH) Interventions SDOH Screenings   Food Insecurity: No Food Insecurity (08/13/2024)  Housing: Unknown (08/13/2024)  Transportation Needs: No Transportation Needs (08/13/2024)  Utilities: Not At Risk (08/13/2024)  Social Connections: Patient Declined (07/26/2024)  Tobacco Use: High Risk (07/25/2024)    Readmission Risk Interventions    08/13/2024   12:05 PM 12/02/2023    9:43 PM  Readmission Risk Prevention Plan  Post Dischage Appt  Complete  Medication Screening  Complete  Transportation Screening Complete Complete  PCP or Specialist Appt within 3-5 Days Complete   HRI or Home Care Consult Complete   Social Work Consult for Recovery Care Planning/Counseling Complete   Palliative Care Screening Not Applicable   Medication Review Oceanographer) Complete

## 2024-08-16 NOTE — Progress Notes (Addendum)
 Occupational Therapy Treatment Patient Details Name: Claire Day MRN: [id number] DOB: 09/14/1965 Today's Date: 08/16/2024   History of present illness Pt is a 59 yo female that presented to ED for acute respiratory distress. Initially placed on bipap but intubated and admitted to ICU (extubated 1/20). PMH of HIV (low CD4), COPD (not on home O2), active tobacco use, NICM with HFrEF s/p AICD, HTN, CKD III, cerebral aneurysm, prior TIA, recent Influenza A and COVID-19 infection   OT comments  Pt seen for OT treatment on this date.  Upon arrival to room pt seated in chair, agreeable to tx session targeting improving functional activity tolerance in prep for ADL tasks. Pt continues to make progress towards goals as evidenced by performing STS with CGA-MIN A with RW, amb approx 15' to sink with CGA-MIN A for RW management standing grooming tasks at sink with CGA, pt amb back to chair with RW with CGA.  Intermittent vcs for technique/safety with RW.  Pt is making progress towards goals, will continue to follow POC. Discharge recommendation remains appropriate. OT will continue to follow.   Spo2 to 84% on 2L via [place] during mobility attempts both amb to sink and back to chair from sink, >90% on 2L after approx 30 seconds rest (standing or sitting); pt does not report SOB throughout        If plan is discharge home, recommend the following:  A little help with walking and/or transfers;A little help with bathing/dressing/bathroom;Assist for transportation;Assistance with cooking/housework   Equipment Recommendations  Other (comment) (defer to next venue of care)    Recommendations for Other Services      Precautions / Restrictions Precautions Precautions: Fall Recall of Precautions/Restrictions: Intact Restrictions Weight Bearing Restrictions Per Provider Order: No       Mobility Bed Mobility                    Transfers Overall transfer level: Needs assistance Equipment used:  Rolling walker (2 wheels) Transfers: Sit to/from Stand                   Balance Overall balance assessment: Needs assistance Sitting-balance support: Feet supported, No upper extremity supported Sitting balance-Leahy Scale: Good     Standing balance support: Bilateral upper extremity supported, During functional activity, Reliant on assistive device for balance                               ADL either performed or assessed with clinical judgement   ADL Overall ADL's : Needs assistance/impaired                                       General ADL Comments: CGA-MIN A for sit to stand with RW. Pt ambulated to sink from chair and back approx 15' two attempts with CGA-MIN A with RW. CGA brushing teeth at sink using RW. Intermittent vcs throughout for technique.     Extremity/Trunk Assessment Upper Extremity Assessment Upper Extremity Assessment: Generalized weakness   Lower Extremity Assessment Lower Extremity Assessment: Generalized weakness        Vision       Perception     Praxis     Communication Communication Communication: No apparent difficulties   Cognition Arousal: Alert Behavior During Therapy: WFL for tasks assessed/performed Cognition: No apparent impairments  Following commands: Intact        Cueing   Cueing Techniques: Verbal cues  Exercises      Shoulder Instructions       General Comments      Pertinent Vitals/ Pain       Pain Assessment Pain Assessment: No/denies pain  Home Living                                          Prior Functioning/Environment              Frequency  Min 3X/week        Progress Toward Goals  OT Goals(current goals can now be found in the care plan section)  Progress towards OT goals: Progressing toward goals  Acute Rehab OT Goals Patient Stated Goal: Functionally Improve OT Goal Formulation: With  patient Time For Goal Achievement: 08/30/24 Potential to Achieve Goals: Good  Plan      Co-evaluation                 AM-PAC OT 6 Clicks Daily Activity     Outcome Measure   Help from another person eating meals?: None Help from another person taking care of personal grooming?: None Help from another person toileting, which includes using toliet, bedpan, or urinal?: A Little Help from another person bathing (including washing, rinsing, drying)?: A Little Help from another person to put on and taking off regular upper body clothing?: None Help from another person to put on and taking off regular lower body clothing?: A Little 6 Click Score: 21    End of Session Equipment Utilized During Treatment: Rolling walker (2 wheels)  OT Visit Diagnosis: Other abnormalities of gait and mobility (R26.89);Muscle weakness (generalized) (M62.81)   Activity Tolerance Patient tolerated treatment well   Patient Left in chair;with call bell/phone within reach;with chair alarm set   Nurse Communication Mobility status        Time: 9753-9699 OT Time Calculation (min): 14 min  Charges: OT General Charges $OT Visit: 1 Visit OT Treatments $Self Care/Home Management : 8-22 mins  Ellie Henderson OTS   Ronita Sauers 08/16/2024, 3:48 PM

## 2024-08-17 ENCOUNTER — Other Ambulatory Visit: Payer: Self-pay

## 2024-08-17 ENCOUNTER — Other Ambulatory Visit (HOSPITAL_COMMUNITY): Payer: Self-pay

## 2024-08-17 ENCOUNTER — Telehealth (HOSPITAL_COMMUNITY): Payer: Self-pay

## 2024-08-17 DIAGNOSIS — I5023 Acute on chronic systolic (congestive) heart failure: Secondary | ICD-10-CM | POA: Diagnosis not present

## 2024-08-17 DIAGNOSIS — N189 Chronic kidney disease, unspecified: Secondary | ICD-10-CM

## 2024-08-17 DIAGNOSIS — J441 Chronic obstructive pulmonary disease with (acute) exacerbation: Secondary | ICD-10-CM | POA: Diagnosis not present

## 2024-08-17 DIAGNOSIS — J9622 Acute and chronic respiratory failure with hypercapnia: Secondary | ICD-10-CM | POA: Diagnosis not present

## 2024-08-17 DIAGNOSIS — Z95810 Presence of automatic (implantable) cardiac defibrillator: Secondary | ICD-10-CM | POA: Diagnosis not present

## 2024-08-17 DIAGNOSIS — J9601 Acute respiratory failure with hypoxia: Secondary | ICD-10-CM | POA: Diagnosis not present

## 2024-08-17 DIAGNOSIS — N179 Acute kidney failure, unspecified: Secondary | ICD-10-CM | POA: Diagnosis not present

## 2024-08-17 DIAGNOSIS — J9602 Acute respiratory failure with hypercapnia: Secondary | ICD-10-CM | POA: Diagnosis not present

## 2024-08-17 DIAGNOSIS — J9621 Acute and chronic respiratory failure with hypoxia: Secondary | ICD-10-CM | POA: Diagnosis not present

## 2024-08-17 DIAGNOSIS — B20 Human immunodeficiency virus [HIV] disease: Secondary | ICD-10-CM | POA: Diagnosis not present

## 2024-08-17 LAB — BASIC METABOLIC PANEL WITH GFR
Anion gap: 10 (ref 5–15)
BUN: 39 mg/dL — ABNORMAL HIGH (ref 6–20)
CO2: 25 mmol/L (ref 22–32)
Calcium: 8.4 mg/dL — ABNORMAL LOW (ref 8.9–10.3)
Chloride: 114 mmol/L — ABNORMAL HIGH (ref 98–111)
Creatinine, Ser: 3.69 mg/dL — ABNORMAL HIGH (ref 0.44–1.00)
GFR, Estimated: 14 mL/min — ABNORMAL LOW
Glucose, Bld: 110 mg/dL — ABNORMAL HIGH (ref 70–99)
Potassium: 5.2 mmol/L — ABNORMAL HIGH (ref 3.5–5.1)
Sodium: 149 mmol/L — ABNORMAL HIGH (ref 135–145)

## 2024-08-17 LAB — PHOSPHORUS: Phosphorus: 2.6 mg/dL (ref 2.5–4.6)

## 2024-08-17 LAB — GLUCOSE, CAPILLARY
Glucose-Capillary: 89 mg/dL (ref 70–99)
Glucose-Capillary: 89 mg/dL (ref 70–99)
Glucose-Capillary: 123 mg/dL — ABNORMAL HIGH (ref 70–99)
Glucose-Capillary: 97 mg/dL (ref 70–99)
Glucose-Capillary: 96 mg/dL (ref 70–99)

## 2024-08-17 LAB — CBC
HCT: 40.1 % (ref 36.0–46.0)
Hemoglobin: 12.8 g/dL (ref 12.0–15.0)
MCH: 28.8 pg (ref 26.0–34.0)
MCHC: 31.9 g/dL (ref 30.0–36.0)
MCV: 90.1 fL (ref 80.0–100.0)
Platelets: 157 K/uL (ref 150–400)
RBC: 4.45 MIL/uL (ref 3.87–5.11)
RDW: 15.6 % — ABNORMAL HIGH (ref 11.5–15.5)
WBC: 5.9 K/uL (ref 4.0–10.5)
nRBC: 0 % (ref 0.0–0.2)

## 2024-08-17 LAB — MAGNESIUM: Magnesium: 2.1 mg/dL (ref 1.7–2.4)

## 2024-08-17 MED ORDER — CARVEDILOL 6.25 MG PO TABS
3.1250 mg | ORAL_TABLET | Freq: Two times a day (BID) | ORAL | Status: DC
  Administered 2024-08-17 – 2024-08-18 (×2): 3.125 mg via ORAL
  Filled 2024-08-17 (×2): qty 1

## 2024-08-17 MED ORDER — DEXTROSE 5 % IV SOLN: INTRAVENOUS | Status: DC

## 2024-08-17 MED ORDER — FLUTICASONE FUROATE-VILANTEROL 200-25 MCG/ACT IN AEPB: 1.0000 | INHALATION_SPRAY | Freq: Every day | RESPIRATORY_TRACT | Status: DC

## 2024-08-17 NOTE — Hospital Course (Signed)
 59 year old female with PMH significant for HIV (low CD4), COPD (not on home O?), active tobacco use, NICM with HFrEF s/p AICD, HTN, CKD III, cerebral aneurysm, prior TIA, recent Influenza A and COVID-19 infection presenting to the ED with complaints of acute respiratory distress.   On chart review patient with recent admission (07/25/24-07/27/24) for acute respiratory failure secondary to AECOPD, Influenza A, and COVID-19, treated with steroids, antivirals, antibiotics, and BiPAP, discharged on room air. She re-presented today via EMS with severe shortness of breath. On EMS arrival, she was tripoding and speaking in short sentences and was placed on BiPAP, receiving IV magnesium  and Solu-Medrol  125 mg en route. Patient treated DuoNeb, LR 500 mL bolus, antibiotics (cefepime  2 g IV, vanco 1 g IV, doxy 100  mg IV). Propofol  infusion for sedation. Intubated for mechanical ventilation. Admitted to ICU.    Patient treated with IV antibiotics, mechanical ventilation and vasopressors. Extubated and weaned off pressors and transferred to medicine floor.

## 2024-08-17 NOTE — Progress Notes (Signed)
 Inpatient Rehab Admissions Coordinator:  Spoke with pt's daughter Shirly on the phone to discuss CIR goals and expectations. Discussed average length of stay, insurance authorization requirement and discharge home after completion of CIR. Shirly acknowledged understanding and would like pt to pursue CIR. Shirly confirmed that family would be able to provide 24/7 support for pt after discharge. Will continue to follow.   Tinnie Yvone Cohens, MS, CCC-SLP Admissions Coordinator [phone]

## 2024-08-17 NOTE — Progress Notes (Addendum)
 " PROGRESS NOTE    Claire Day  FMW:[id number] DOB: February 08, 1966 DOA: 08/13/2024 PCP: Meade Bigness, MD  Subjective: No acute events overnight. Seen and examined at bedside. Reports mild improvement in appetite only and waiting for breakfast. No new complaints. Denies nausea, vomiting, constipation.    Hospital Course: 59 year old female with PMH significant for HIV (low CD4), COPD (not on home O?), active tobacco use, NICM with HFrEF s/p AICD, HTN, CKD III, cerebral aneurysm, prior TIA, recent Influenza A and COVID-19 infection presenting to the ED with complaints of acute respiratory distress.   On chart review patient with recent admission (07/25/24-07/27/24) for acute respiratory failure secondary to AECOPD, Influenza A, and COVID-19, treated with steroids, antivirals, antibiotics, and BiPAP, discharged on room air. She re-presented today via EMS with severe shortness of breath. On EMS arrival, she was tripoding and speaking in short sentences and was placed on BiPAP, receiving IV magnesium  and Solu-Medrol  125 mg en route. Patient treated DuoNeb, LR 500 mL bolus, antibiotics (cefepime  2 g IV, vanco 1 g IV, doxy 100  mg IV). Propofol  infusion for sedation. Intubated for mechanical ventilation. Admitted to ICU.    Patient treated with IV antibiotics, mechanical ventilation and vasopressors. Extubated and weaned off pressors and transferred to medicine floor.    Assessment and Plan:  Acute Hypoxic Hypercapnic Respiratory Failure AECOPD CAP Hx of COPD; smoker  - status post mechanical ventilation. Extubated 1/20 - currently on 2L oxygen, not on oxygen at home - cont duonebs - hold off on steroids for now -Nicotine  replacement therapy - start incentive spirometry - OOB to chair during daytime - encourage ambulation - wean oxygen as tolerated   Suspected Pneumonia  CXR: hyperinflation with subtle RLL patchy airspace disease Initial interventions/workup included: 2 L of NS/LR & cefepime ,  vancomycin , doxycycline  - covid/influenza/RSV negative - resp viral panel negative - MRSA screen negative - strep urine Ag negative - resp Cx with few GPCs, final read pending - Bcx 1/19 showed NGTD, final read pending -Cont doxycycline , plan for 5 day course to end 1/23 - ID following   AKI on CKD stage III Bilateral nephrolithiasis Cr 3.69 < 4.02, baseline Cr 1-1.3 - etiology unclear, likely in the setting of severe sepsis/septic shock requiring vasopressors - FeNa 11.7% concerning for intrinsic renal disease - US  renal showed medical renal disease, bilateral nephrolithiasis (1.2cm R mid pole and 0.7cm L lower pole), mild fullness of R intrarenal collecting system - status post gentle IV fluids 1/21 - D5W as elsewhere for hypernatremia - renal dose meds -Avoid nephrotoxic agents -Follow I/Os, renal function - consider nephrology consult if continues to worsen  Hypernatremia - likely due to dehydration from poor oral intake - asymptomatic at present - Na 149 < 145 - will start D5W - encourage oral intake/hydration - monitor BMP   NICM/HFrEF status post AICD HTN HLD Last Echo 01/2023 recovered EF >65%,  -Euvolemic on exam.  -BNP: 5944 -Chest XR (1/18): No pulmonary edema -2Decho 1/19 showed LVEF 25-30%, global hypokinesis, mild LVH, grade 1 diastolic dysfunction - started on BiDil  by cardiology team -Hold Diuretics for now - D5W as elsewhere for hypernatremia -Strict I/Os - Cardiology following    Hypoglycemia Severe malnutrition Poor oral intake Noted to have Hyperglycemia earlier during MICU stay secondary to stress steroids with no hx of DM - HgbA1c 5.6 - D5W as elsewhere for hypernatremia - encourage oral intake/hydration - cont home mirtazapine  - dietary supplements as per dietician recs - cont SSI with FS ACHS -  Follow hypo/hyperglycemic protocol    HIV/AIDS PCP Pneumonia -Check Viral load , CD4 count -Per Last ID notes on prezcobix  and rilpavirine    - cont home darunavir -cobicistat  and rilpivirine  - cont home dapsone  -ID following  DVT prophylaxis: heparin  injection 5,000 Units Start: 08/13/24 0600  SQ Heparin    Code Status: Full Code  Disposition Plan: acute rehab Reason for continuing need for hospitalization: monitor renal function, cardio recs,   Objective: Vitals:   08/17/24 0500 08/17/24 0806 08/17/24 0837 08/17/24 1207  BP:   (!) 148/105 (!) 149/94  Pulse:   (!) 118 (!) 101  Resp:   16 16  Temp:   (!) 97.5 F (36.4 C) 97.8 F (36.6 C)  TempSrc:      SpO2:  (P) 100% 92% 100%  Weight: 44.6 kg       Intake/Output Summary (Last 24 hours) at 08/17/2024 1231 Last data filed at 08/17/2024 0020 Gross per 24 hour  Intake 720 ml  Output --  Net 720 ml   Filed Weights   08/15/24 0500 08/16/24 0440 08/17/24 0500  Weight: 44.6 kg 45 kg 44.6 kg    Examination:  Physical Exam Vitals and nursing note reviewed.  Constitutional:      General: She is not in acute distress.    Appearance: She is ill-appearing.     Comments: Weak, frail, cachectic  HENT:     Head: Normocephalic and atraumatic.  Cardiovascular:     Rate and Rhythm: Normal rate and regular rhythm.     Pulses: Normal pulses.     Heart sounds: Normal heart sounds.  Pulmonary:     Effort: Pulmonary effort is normal.     Breath sounds: Normal breath sounds.  Abdominal:     General: Bowel sounds are normal.     Palpations: Abdomen is soft.  Musculoskeletal:     Right lower leg: No edema.     Left lower leg: No edema.  Neurological:     Mental Status: She is alert. Mental status is at baseline.     Data Reviewed: I have personally reviewed following labs and imaging studies  CBC: Recent Labs  Lab 08/13/24 0411 08/14/24 0434 08/15/24 0346 08/16/24 0458 08/17/24 0622  WBC 9.5 11.7*  11.5* 9.2 7.1 5.9  NEUTROABS 4.2 8.9*  --   --   --   HGB 13.2 12.2  12.5 11.4* 13.4 12.8  HCT 42.2 38.2  37.6 35.4* 41.3 40.1  MCV 93.4 90.5  90 90.8 89.2  90.1  PLT 215 187  184 141* 168 157   Basic Metabolic Panel: Recent Labs  Lab 08/13/24 1714 08/14/24 0434 08/15/24 0346 08/16/24 0458 08/16/24 1331 08/17/24 0622  NA  --  139 141 144 145 149*  K 4.7 4.9 5.0 5.1 5.1 5.2*  CL  --  108 109 112* 111 114*  CO2  --  20* 21* 25 22 25   GLUCOSE  --  101* 90 80 101* 110*  BUN  --  37* 46* 52* 46* 39*  CREATININE  --  2.76* 3.48* 4.02* 3.92* 3.69*  CALCIUM   --  7.9* 8.4* 8.7* 8.8* 8.4*  MG 2.4 2.4 2.3 2.5*  --  2.1  PHOS  --  5.5* 5.8* 3.2  --  2.6   GFR: Estimated Creatinine Clearance: 11.7 mL/min (A) (by C-G formula based on SCr of 3.69 mg/dL (H)). Liver Function Tests: Recent Labs  Lab 08/13/24 0411  AST 36  ALT 17  ALKPHOS 129*  BILITOT  0.2  PROT 5.9*  ALBUMIN 3.3*   No results for input(s): LIPASE, AMYLASE in the last 168 hours. No results for input(s): AMMONIA in the last 168 hours. Coagulation Profile: Recent Labs  Lab 08/13/24 0411  INR 1.1   Cardiac Enzymes: No results for input(s): CKTOTAL, CKMB, CKMBINDEX, TROPONINI in the last 168 hours. ProBNP, BNP (last 5 results) Recent Labs    12/02/23 0907 07/25/24 1516 08/13/24 0509  PROBNP  --  3,282.0* 5,944.0*  BNP 105.0*  --   --    HbA1C: No results for input(s): HGBA1C in the last 72 hours. CBG: Recent Labs  Lab 08/16/24 1932 08/16/24 2311 08/17/24 0322 08/17/24 0758 08/17/24 1204  GLUCAP 109* 84 89 89 123*   Lipid Profile: No results for input(s): CHOL, HDL, LDLCALC, TRIG, CHOLHDL, LDLDIRECT in the last 72 hours. Thyroid Function Tests: No results for input(s): TSH, T4TOTAL, FREET4, T3FREE, THYROIDAB in the last 72 hours. Anemia Panel: No results for input(s): VITAMINB12, FOLATE, FERRITIN, TIBC, IRON, RETICCTPCT in the last 72 hours. Sepsis Labs: Recent Labs  Lab 08/13/24 0411 08/13/24 0902 08/13/24 1108 08/13/24 2252  LATICACIDVEN 5.0* 1.4 2.8* 2.7*    Recent Results (from the past 240  hours)  Resp panel by RT-PCR (RSV, Flu A&B, Covid) Anterior Nasal Swab     Status: None   Collection Time: 08/13/24  4:11 AM   Specimen: Anterior Nasal Swab  Result Value Ref Range Status   SARS Coronavirus 2 by RT PCR NEGATIVE NEGATIVE Final    Comment: (NOTE) SARS-CoV-2 target nucleic acids are NOT DETECTED.  The SARS-CoV-2 RNA is generally detectable in upper respiratory specimens during the acute phase of infection. The lowest concentration of SARS-CoV-2 viral copies this assay can detect is 138 copies/mL. A negative result does not preclude SARS-Cov-2 infection and should not be used as the sole basis for treatment or other patient management decisions. A negative result may occur with  improper specimen collection/handling, submission of specimen other than nasopharyngeal swab, presence of viral mutation(s) within the areas targeted by this assay, and inadequate number of viral copies(<138 copies/mL). A negative result must be combined with clinical observations, patient history, and epidemiological information. The expected result is Negative.  Fact Sheet for Patients:  bloggercourse.com  Fact Sheet for Healthcare Providers:  seriousbroker.it  This test is no t yet approved or cleared by the [place]  FDA and  has been authorized for detection and/or diagnosis of SARS-CoV-2 by FDA under an Emergency Use Authorization (EUA). This EUA will remain  in effect (meaning this test can be used) for the duration of the COVID-19 declaration under Section 564(b)(1) of the Act, 21 U.S.C.section 360bbb-3(b)(1), unless the authorization is terminated  or revoked sooner.       Influenza A by PCR NEGATIVE NEGATIVE Final   Influenza B by PCR NEGATIVE NEGATIVE Final    Comment: (NOTE) The Xpert Xpress SARS-CoV-2/FLU/RSV plus assay is intended as an aid in the diagnosis of influenza from Nasopharyngeal swab specimens and should not  be used as a sole basis for treatment. Nasal washings and aspirates are unacceptable for Xpert Xpress SARS-CoV-2/FLU/RSV testing.  Fact Sheet for Patients: bloggercourse.com  Fact Sheet for Healthcare Providers: seriousbroker.it  This test is not yet approved or cleared by the [place]  FDA and has been authorized for detection and/or diagnosis of SARS-CoV-2 by FDA under an Emergency Use Authorization (EUA). This EUA will remain in effect (meaning this test can be used) for the duration of the COVID-19 declaration  under Section 564(b)(1) of the Act, 21 U.S.C. section 360bbb-3(b)(1), unless the authorization is terminated or revoked.     Resp Syncytial Virus by PCR NEGATIVE NEGATIVE Final    Comment: (NOTE) Fact Sheet for Patients: bloggercourse.com  Fact Sheet for Healthcare Providers: seriousbroker.it  This test is not yet approved or cleared by the [place]  FDA and has been authorized for detection and/or diagnosis of SARS-CoV-2 by FDA under an Emergency Use Authorization (EUA). This EUA will remain in effect (meaning this test can be used) for the duration of the COVID-19 declaration under Section 564(b)(1) of the Act, 21 U.S.C. section 360bbb-3(b)(1), unless the authorization is terminated or revoked.  Performed at [facility], [address] [place], [place] [address]   Blood Culture (routine x 2)     Status: None (Preliminary result)   Collection Time: 08/13/24  4:11 AM   Specimen: BLOOD  Result Value Ref Range Status   Specimen Description BLOOD BLOOD LEFT FOREARM  Final   Special Requests   Final    BOTTLES DRAWN AEROBIC AND ANAEROBIC Blood Culture adequate volume   Culture   Final    NO GROWTH 4 DAYS Performed at [facility], [address], [place], [place] [address]    Report Status PENDING  Incomplete  Blood Culture (routine x  2)     Status: None (Preliminary result)   Collection Time: 08/13/24  4:11 AM   Specimen: BLOOD  Result Value Ref Range Status   Specimen Description BLOOD LEFT ANTECUBITAL  Final   Special Requests   Final    BOTTLES DRAWN AEROBIC AND ANAEROBIC Blood Culture adequate volume   Culture   Final    NO GROWTH 4 DAYS Performed at [facility], [address] [place], [place] [address]    Report Status PENDING  Incomplete  Urine Culture     Status: Abnormal   Collection Time: 08/13/24  4:11 AM   Specimen: Urine, Random  Result Value Ref Range Status   Specimen Description   Final    URINE, RANDOM Performed at [facility], [address] [place], [place] [address]    Special Requests   Final    URINE, CLEAN CATCH Performed at [facility], [address], [place], [place] [address]    Culture >=100,000 COLONIES/mL ESCHERICHIA COLI (A)  Final   Report Status 08/15/2024 FINAL  Final   Organism ID, Bacteria ESCHERICHIA COLI (A)  Final      Susceptibility   Escherichia coli - MIC*    AMPICILLIN <=2 SENSITIVE Sensitive     CEFAZOLIN (URINE) Value in next row Sensitive      4 SENSITIVEThis is a modified FDA-approved test that has been validated and its performance characteristics determined by the reporting laboratory.  This laboratory is certified under the Clinical Laboratory Improvement Amendments CLIA as qualified to perform high complexity clinical laboratory testing.    CEFEPIME  Value in next row Sensitive      4 SENSITIVEThis is a modified FDA-approved test that has been validated and its performance characteristics determined by the reporting laboratory.  This laboratory is certified under the Clinical Laboratory Improvement Amendments CLIA as qualified to perform high complexity clinical laboratory testing.    ERTAPENEM Value in next row Sensitive      4 SENSITIVEThis is a modified FDA-approved test that has been validated and its performance  characteristics determined by the reporting laboratory.  This laboratory is certified under the Clinical Laboratory Improvement  Amendments CLIA as qualified to perform high complexity clinical laboratory testing.    CEFTRIAXONE  Value in next row Sensitive      4 SENSITIVEThis is a modified FDA-approved test that has been validated and its performance characteristics determined by the reporting laboratory.  This laboratory is certified under the Clinical Laboratory Improvement Amendments CLIA as qualified to perform high complexity clinical laboratory testing.    CIPROFLOXACIN Value in next row Sensitive      4 SENSITIVEThis is a modified FDA-approved test that has been validated and its performance characteristics determined by the reporting laboratory.  This laboratory is certified under the Clinical Laboratory Improvement Amendments CLIA as qualified to perform high complexity clinical laboratory testing.    GENTAMICIN Value in next row Sensitive      4 SENSITIVEThis is a modified FDA-approved test that has been validated and its performance characteristics determined by the reporting laboratory.  This laboratory is certified under the Clinical Laboratory Improvement Amendments CLIA as qualified to perform high complexity clinical laboratory testing.    NITROFURANTOIN Value in next row Sensitive      4 SENSITIVEThis is a modified FDA-approved test that has been validated and its performance characteristics determined by the reporting laboratory.  This laboratory is certified under the Clinical Laboratory Improvement Amendments CLIA as qualified to perform high complexity clinical laboratory testing.    TRIMETH/SULFA Value in next row Resistant      4 SENSITIVEThis is a modified FDA-approved test that has been validated and its performance characteristics determined by the reporting laboratory.  This laboratory is certified under the Clinical Laboratory Improvement Amendments CLIA as qualified to perform  high complexity clinical laboratory testing.    AMPICILLIN/SULBACTAM Value in next row Sensitive      4 SENSITIVEThis is a modified FDA-approved test that has been validated and its performance characteristics determined by the reporting laboratory.  This laboratory is certified under the Clinical Laboratory Improvement Amendments CLIA as qualified to perform high complexity clinical laboratory testing.    PIP/TAZO Value in next row Sensitive      <=4 SENSITIVEThis is a modified FDA-approved test that has been validated and its performance characteristics determined by the reporting laboratory.  This laboratory is certified under the Clinical Laboratory Improvement Amendments CLIA as qualified to perform high complexity clinical laboratory testing.    MEROPENEM Value in next row Sensitive      <=4 SENSITIVEThis is a modified FDA-approved test that has been validated and its performance characteristics determined by the reporting laboratory.  This laboratory is certified under the Clinical Laboratory Improvement Amendments CLIA as qualified to perform high complexity clinical laboratory testing.    * >=100,000 COLONIES/mL ESCHERICHIA COLI  MRSA Next Gen by PCR, Nasal     Status: None   Collection Time: 08/13/24  6:30 AM   Specimen: Nasal Mucosa; Nasal Swab  Result Value Ref Range Status   MRSA by PCR Next Gen NOT DETECTED NOT DETECTED Final    Comment: (NOTE) The GeneXpert MRSA Assay (FDA approved for NASAL specimens only), is one component of a comprehensive MRSA colonization surveillance program. It is not intended to diagnose MRSA infection nor to guide or monitor treatment for MRSA infections. Test performance is not FDA approved in patients less than 82 years old. Performed at [facility], [address] [place], [place] [address]   Respiratory (~20 pathogens) panel by PCR     Status: None   Collection Time: 08/13/24  6:43 AM   Specimen: Nasopharyngeal Swab;  Respiratory   Result Value Ref Range Status   Adenovirus NOT DETECTED NOT DETECTED Final   Coronavirus 229E NOT DETECTED NOT DETECTED Final    Comment: (NOTE) The Coronavirus on the Respiratory Panel, DOES NOT test for the novel  Coronavirus (2019 nCoV)    Coronavirus HKU1 NOT DETECTED NOT DETECTED Final   Coronavirus NL63 NOT DETECTED NOT DETECTED Final   Coronavirus OC43 NOT DETECTED NOT DETECTED Final   Metapneumovirus NOT DETECTED NOT DETECTED Final   Rhinovirus / Enterovirus NOT DETECTED NOT DETECTED Final   Influenza A NOT DETECTED NOT DETECTED Final   Influenza B NOT DETECTED NOT DETECTED Final   Parainfluenza Virus 1 NOT DETECTED NOT DETECTED Final   Parainfluenza Virus 2 NOT DETECTED NOT DETECTED Final   Parainfluenza Virus 3 NOT DETECTED NOT DETECTED Final   Parainfluenza Virus 4 NOT DETECTED NOT DETECTED Final   Respiratory Syncytial Virus NOT DETECTED NOT DETECTED Final   Bordetella pertussis NOT DETECTED NOT DETECTED Final   Bordetella Parapertussis NOT DETECTED NOT DETECTED Final   Chlamydophila pneumoniae NOT DETECTED NOT DETECTED Final   Mycoplasma pneumoniae NOT DETECTED NOT DETECTED Final    Comment: Performed at [facility], [address], [place], [place] [address]  Culture, Respiratory w Gram Stain     Status: None   Collection Time: 08/13/24  3:54 PM   Specimen: Tracheal Aspirate; Respiratory  Result Value Ref Range Status   Specimen Description   Final    TRACHEAL ASPIRATE Performed at [facility], [address] [place], [place] [address]    Special Requests   Final    NONE Performed at [facility], [address] [place], [place] [address]    Gram Stain   Final    FEW WBC PRESENT, PREDOMINANTLY PMN FEW GRAM POSITIVE COCCI    Culture   Final    FEW Normal respiratory flora-no Staph aureus or Pseudomonas seen Performed at [facility], [address], [place], [place] [address]    Report Status 08/16/2024 FINAL  Final      Radiology Studies: No results found.  Scheduled Meds:  B-complex with vitamin C  1 tablet Oral Daily   calcium  carbonate  1 tablet Oral TID WC   dapsone   100 mg Oral QHS   darunavir -cobicistat   1 tablet Oral Q supper   doxycycline   100 mg Oral Q12H   feeding supplement  237 mL Oral BID BM   heparin   5,000 Units Subcutaneous Q8H   insulin  aspart  0-15 Units Subcutaneous Q4H   ipratropium-albuterol   3 mL Nebulization BID   isosorbide -hydrALAZINE   1 tablet Oral TID   mirtazapine   15 mg Oral QHS   multivitamin with minerals  1 tablet Oral Daily   polyethylene glycol  17 g Oral Daily   rilpivirine   25 mg Oral Q supper   senna  1 tablet Oral BID   thiamine   100 mg Oral Daily   Continuous Infusions:  dextrose  40 mL/hr at 08/17/24 0834     LOS: 4 days   Norval Bar, MD  [facility]  08/17/2024, 12:31 PM   "

## 2024-08-17 NOTE — TOC Progression Note (Signed)
 Transition of Care [facility]) - Progression Note    Patient Details  Name: TYISHA CRESSY MRN: [id number] Date of Birth: October 04, 1965  Transition of [facility]) CM/SW Contact  Corean ONEIDA Haddock, RN Phone Number: 08/17/2024, 2:35 PM  Clinical Narrative:           Per chart review at this time plan is for [facility] in patient rehab.  Will require auth, and CIR admissions coordinator to follow               Expected Discharge Plan and Services                                               Social Drivers of Health (SDOH) Interventions SDOH Screenings   Food Insecurity: No Food Insecurity (08/13/2024)  Housing: Unknown (08/13/2024)  Transportation Needs: No Transportation Needs (08/13/2024)  Utilities: Not At Risk (08/13/2024)  Social Connections: Patient Declined (07/26/2024)  Tobacco Use: High Risk (07/25/2024)    Readmission Risk Interventions    08/13/2024   12:05 PM 12/02/2023    9:43 PM  Readmission Risk Prevention Plan  Post Dischage Appt  Complete  Medication Screening  Complete  Transportation Screening Complete Complete  PCP or Specialist Appt within 3-5 Days Complete   HRI or Home Care Consult Complete   Social Work Consult for Recovery Care Planning/Counseling Complete   Palliative Care Screening Not Applicable   Medication Review Oceanographer) Complete

## 2024-08-17 NOTE — Progress Notes (Signed)
" °   08/16/24 1928  Assess: MEWS Score  Temp 98 F (36.7 C)  BP (!) 136/95  MAP (mmHg) 107  Pulse Rate (!) 113  Resp 16  Level of Consciousness Alert  SpO2 96 %  O2 Device Room Air  Assess: MEWS Score  MEWS Temp 0  MEWS Systolic 0  MEWS Pulse 2  MEWS RR 0  MEWS LOC 0  MEWS Score 2  MEWS Score Color Yellow  Assess: if the MEWS score is Yellow or Red  Were vital signs accurate and taken at a resting state? Yes  Does the patient meet 2 or more of the SIRS criteria? No  MEWS guidelines implemented  Yes, yellow  Treat  MEWS Interventions Considered administering scheduled or prn medications/treatments as ordered  Take Vital Signs  Increase Vital Sign Frequency  Yellow: Q2hr x1, continue Q4hrs until patient remains green for 12hrs  Escalate  MEWS: Escalate Yellow: Discuss with charge nurse and consider notifying provider and/or RRT  Notify: Charge Nurse/RN  Name of Charge Nurse/RN Notified Christina,RN  Provider Notification  Provider Name/Title Laneta, D,NP  Date Provider Notified 08/16/24  Time Provider Notified 2027  Method of Notification Face-to-face  Notification Reason Change in status (Yellow MEWS)  Provider response In department;No new orders (Notify provider if HR in the 120's)  Date of Provider Response 08/16/24  Time of Provider Response 2028  Assess: SIRS CRITERIA  SIRS Temperature  0  SIRS Respirations  0  SIRS Pulse 1  SIRS WBC 0  SIRS Score Sum  1   Patient noted with a yellow MEWS. HR above 100. NP Laneta, D notified and orders received to contact her if HR sustains in the 120's. Yellow MEWS guidelines being implemented. Patient with no complains at this time and resting comfortably in bed. "

## 2024-08-17 NOTE — PMR Pre-admission (Shared)
 PMR Admission Coordinator Pre-Admission Assessment  Patient: Claire Day is an 59 y.o., female MRN: [id number] DOB: [birth year] Height:   Weight: 44.6 kg  Insurance Information HMO: ***    PPO: ***     PCP:      IPA:      80/20:      OTHER:  PRIMARY: [facility]    Policy#: [id number]      Subscriber: patient CM Name: ***      Phone#: ***     Fax#: *** Pre-Cert#: ***      Employer: *** Benefits:  Phone #: ***     Name: *** Eustacio. Date: ***     Deduct: ***      Out of Pocket Max: ***      Life Max: *** CIR: ***      SNF: *** Outpatient: ***     Co-Pay: *** Home Health: ***      Co-Pay: *** DME: ***     Co-Pay: *** Providers: in-network SECONDARY:       Policy#:      Phone#:   Financial Counselor:       Phone#:   [facility] for patients in Inpatient Rehabilitation Facilities with attached Privacy Act Statement-Health Care Records was provided and verbally reviewed with: {CHL IP Patient Family WJ:[id number]}  Emergency Contact Information Contact Information     Name Relation Home Work Mobile   Clay,Tamicka Daughter [phone]  [phone]      Other Contacts     Name Relation Home Work Mobile   Lawai Sister   [phone]   Roslynn Amaryllis Burrows   [phone]       Current Medical History  Patient Admitting Diagnosis: *** History of Present Illness: ***    Patient's medical record from [facility] has been reviewed by the rehabilitation admission coordinator and physician.  Past Medical History  Past Medical History:  Diagnosis Date   Allergic rhinitis    Anemia    Aortic insufficiency    Cerebral aneurysm 02/12/2023   a.) CT head 02/12/2023:  3 x 3 mm distal LEFT M1 MCA just proximal to the bifurcation   Chronic systolic heart failure (HCC)    CKD (chronic kidney disease), stage III (HCC)    COPD (chronic obstructive pulmonary disease) (HCC)    History of kidney stones    HIV (human  immunodeficiency virus infection) (HCC) 2003   a.) Dx'd in 2003; b.) currently Tx'd with darunavir -cobicistat  + rilpivirine    HLD (hyperlipidemia)    Hydronephrosis    Hypertension    LBBB (left bundle branch block)    MI (myocardial infarction) (HCC)    Moderate mitral regurgitation    Nephrolithiasis    NICM (nonischemic cardiomyopathy) (HCC)    Pneumonia due to Streptococcus    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 No  Family History   family history is not on file.  Current Medications Current Medications[1]  Patients Current Diet:  Diet Order  Diet regular Room service appropriate? Yes; Fluid consistency: Thin  Diet effective now                   Precautions / Restrictions Precautions Precautions: Fall Restrictions Weight Bearing Restrictions Per Provider Order: No   Has the patient had 2 or more falls or a fall with injury in the past year? No  Prior Activity Level Limited Community (1-2x/wk): gets out of house ~2 days/week  Prior Functional Level Self Care: Did the patient need help bathing, dressing, using the toilet or eating? Independent  Indoor Mobility: Did the patient need assistance with walking from room to room (with or without device)? Independent  Stairs: Did the patient need assistance with internal or external stairs (with or without device)? Needed some help  Functional Cognition: Did the patient need help planning regular tasks such as shopping or remembering to take medications? Independent  Patient  Information    Patient's Response To:     Home Assistive Devices / Equipment Home Equipment: None  Prior Device Use: Indicate devices/aids used by the patient prior to current illness, exacerbation or injury? None of the above  Current Functional Level Cognition  Orientation Level: Oriented X4    Extremity Assessment (includes Sensation/Coordination)  Upper Extremity Assessment: Generalized weakness  Lower Extremity Assessment: Generalized weakness    ADLs  Overall ADL's : Needs assistance/impaired Grooming: Standing, Contact guard assist Grooming Details (indicate cue type and reason): with RW standing at sink Lower Body Dressing: Maximal assistance, Sitting/lateral leans Toilet Transfer: Minimal assistance, Ambulation, BSC/3in1, Regular Toilet, Cueing for safety, Cueing for sequencing Toileting- Clothing Manipulation and Hygiene: Supervision/safety, Sitting/lateral lean Toileting - Clothing Manipulation Details (indicate cue type and reason): continent urine on toilet Functional mobility during ADLs: Rolling walker (2 wheels), Contact guard assist, Minimal assistance (approx 15' two attempts) General ADL Comments: CGA-MIN A for sit to stand with RW. Pt ambulated to sink from chair and back approx 15' two attempts with CGA-MIN A with RW. CGA brushing teeth at sink using RW. Intermittent vcs throughout for technique. (Simultaneous filing. User may not have seen previous data.)    Mobility  Overal bed mobility: Needs Assistance Bed Mobility: Supine to Sit Supine to sit: HOB elevated, Used rails, Contact guard General bed mobility comments: extra time to complete    Transfers  Overall transfer level: Needs assistance Equipment used: Rolling walker (2 wheels) Transfers: Sit to/from Stand Sit to Stand: Min assist, Contact guard assist General transfer comment: minA for steadying, verbal cues for tehcnique each time    Ambulation / Gait / Stairs / Wheelchair Mobility   Ambulation/Gait Ambulation/Gait assistance: Min assist, Contact guard assist Gait Distance (Feet):  (1ft then seated rest break and donned O2, 23ft then seated rest break) Assistive device: Rolling walker (2 wheels) General Gait Details: very slow, almost tandem walking pattern, fatigue noticable. general unsteadiness but no LOB today    Posture / Balance Balance Overall balance assessment: Needs assistance Sitting-balance support: Feet supported, No upper extremity supported Sitting balance-Leahy Scale: Good Standing balance support: Bilateral upper extremity supported, During functional activity, Reliant on assistive device for balance, No upper extremity supported Standing balance-Leahy Scale: Fair Standing balance comment: a few standing balance exercises without UE support. intermittent minA to maintain balance. (FT EO, FT EC, one step forward)    Special considerations/life events  Continuous Drip IV  *** and Diabetic management Novolog  0-15 units every 4 hours   Previous Home Environment (from acute therapy documentation) Living Arrangements: Children,  Parent  Lives With: Daughter Available Help at Discharge: Family, Available 24 hours/day Type of Home: Apartment Home Layout: One level Home Access: Stairs to enter Entrance Stairs-Rails: Right, Left Entrance Stairs-Number of Steps: 35- no elevator access, reports some landings she can rest at [employer] Shower/Tub: Tub/shower unit [employer]: Standard Bathroom Accessibility: Yes How Accessible: Accessible via walker Home Care Services: No  Discharge Living Setting Plans for Discharge Living Setting: Patient's home Type of Home at Discharge: Apartment Discharge Home Layout: One level Discharge Home Access: Stairs to enter Entrance Stairs-Rails: Right, Left (daugher reports there are walls besides steps and not rails) Entrance Stairs-Number of Steps: 3 flights Discharge Bathroom Shower/Tub: Tub/shower unit Discharge  Bathroom Toilet: Standard Discharge Bathroom Accessibility: Yes How Accessible: Accessible via walker Does the patient have any problems obtaining your medications?: No  Social/Family/Support Systems Anticipated Caregiver: Shirly Borrow (daughter) and other family Anticipated Caregiver's Contact Information: [phone] Caregiver Availability: 24/7 Discharge Plan Discussed with Primary Caregiver: Yes Is Caregiver In Agreement with Plan?: Yes Does Caregiver/Family have Issues with Lodging/Transportation while Pt is in Rehab?: No  Goals Patient/Family Goal for Rehab: *** Expected length of stay: *** Pt/Family Agrees to Admission and willing to participate: Yes Program Orientation Provided & Reviewed with Pt/Caregiver Including Roles  & Responsibilities: Yes  Decrease burden of Care through IP rehab admission: NA  Possible need for SNF placement upon discharge: Not anticipated  Patient Condition: {PATIENT'S CONDITION:22832}  Preadmission Screen Completed By:  Tinnie SHAUNNA Yvone Delayne, 08/17/2024 11:53 AM ______________________________________________________________________   Discussed status with Dr. PIERRETTE on *** at *** and received approval for admission today.  Admission Coordinator:  Tinnie SHAUNNA Yvone Delayne, CCC-SLP, time ***/Date ***   Assessment/Plan: Diagnosis: *** Does the need for close, 24 hr/day Medical supervision in concert with the patient's rehab needs make it unreasonable for this patient to be served in a less intensive setting? {yes_no_potentially:3041433} Co-Morbidities requiring supervision/potential complications: *** Due to {due un:6958565}, does the patient require 24 hr/day rehab nursing? {yes_no_potentially:3041433} Does the patient require coordinated care of a physician, rehab nurse, PT, OT, and SLP to address physical and functional deficits in the context of the above medical diagnosis(es)? {yes_no_potentially:3041433} Addressing deficits in the following  areas: {deficits:3041436} Can the patient actively participate in an intensive therapy program of at least 3 hrs of therapy 5 days a week? {yes_no_potentially:3041433} The potential for patient to make measurable gains while on inpatient rehab is {potential:3041437} Anticipated functional outcomes upon discharge from inpatient rehab: {functional outcomes:304600100} PT, {functional outcomes:304600100} OT, {functional outcomes:304600100} SLP Estimated rehab length of stay to reach the above functional goals is: *** Anticipated discharge destination: {anticipated dc setting:21604} 10. Overall Rehab/Functional Prognosis: {potential:3041437}   MD Signature: ***    [1]  Current Facility-Administered Medications:    acetaminophen  (TYLENOL ) tablet 650 mg, 650 mg, Per Tube, Q6H PRN, Shellia Mann D, NP, 650 mg at 08/15/24 0340   alum & mag hydroxide-simeth (MAALOX/MYLANTA) 200-200-20 MG/5ML suspension 15 mL, 15 mL, Oral, Q4H PRN, Cosette Blackwater, MD, 15 mL at 08/15/24 1832   B-complex with vitamin C tablet 1 tablet, 1 tablet, Oral, Daily, Aleskerov, Fuad, MD, 1 tablet at 08/16/24 1222   calcium  carbonate (TUMS - dosed in mg elemental calcium ) chewable tablet 200 mg of elemental calcium , 1 tablet, Oral, TID WC, Tariq, Hassan, MD, 200 mg of elemental calcium  at 08/17/24 9170   dapsone  tablet 100 mg, 100 mg, Oral, QHS, Tariq, Hassan, MD, 100 mg at 08/16/24 2157   darunavir -cobicistat  (PREZCOBIX ) 800-150 MG per tablet 1  tablet, 1 tablet, Oral, Q supper, Ravishankar, Donald, MD, 1 tablet at 08/16/24 1554   dextrose  5 % solution, , Intravenous, Continuous, Cosette Blackwater, MD, Last Rate: 40 mL/hr at 08/17/24 0834, New Bag at 08/17/24 0834   doxycycline  (VIBRA -TABS) tablet 100 mg, 100 mg, Oral, Q12H, Tariq, Hassan, MD, 100 mg at 08/17/24 0826   feeding supplement (ENSURE PLUS HIGH PROTEIN) liquid 237 mL, 237 mL, Oral, BID BM, Aleskerov, Fuad, MD, 237 mL at 08/17/24 1013   heparin  injection 5,000 Units,  5,000 Units, Subcutaneous, Q8H, Ouma, Almarie Bake, NP, 5,000 Units at 08/14/24 1348   insulin  aspart (novoLOG ) injection 0-15 Units, 0-15 Units, Subcutaneous, Q4H, Ouma, Almarie Bake, NP, 2 Units at 08/13/24 2327   ipratropium-albuterol  (DUONEB) 0.5-2.5 (3) MG/3ML nebulizer solution 3 mL, 3 mL, Nebulization, Q6H PRN, Ouma, Almarie Bake, NP   ipratropium-albuterol  (DUONEB) 0.5-2.5 (3) MG/3ML nebulizer solution 3 mL, 3 mL, Nebulization, BID, Tariq, Hassan, MD, 3 mL at 08/17/24 0804   isosorbide -hydrALAZINE  (BIDIL ) 20-37.5 MG per tablet 1 tablet, 1 tablet, Oral, TID, Gollan, Timothy J, MD, 1 tablet at 08/17/24 9171   menthol  (CEPACOL) lozenge 3 mg, 1 lozenge, Oral, PRN, Tariq, Hassan, MD, 3 mg at 08/16/24 2330   mirtazapine  (REMERON ) tablet 15 mg, 15 mg, Oral, QHS, Tariq, Hassan, MD, 15 mg at 08/16/24 2156   multivitamin with minerals tablet 1 tablet, 1 tablet, Oral, Daily, Aleskerov, Fuad, MD, 1 tablet at 08/16/24 9077   ondansetron  (ZOFRAN ) injection 4 mg, 4 mg, Intravenous, Q6H PRN, Keene, Jeremiah D, NP, 4 mg at 08/14/24 1818   Oral care mouth rinse, 15 mL, Mouth Rinse, PRN, Aleskerov, Fuad, MD   phenol (CHLORASEPTIC) mouth spray 1 spray, 1 spray, Mouth/Throat, PRN, Bousman, Karlie, PA-C, 1 spray at 08/14/24 2128   polyethylene glycol (MIRALAX  / GLYCOLAX ) packet 17 g, 17 g, Oral, Daily PRN, Ouma, Elizabeth Achieng, NP   polyethylene glycol (MIRALAX  / GLYCOLAX ) packet 17 g, 17 g, Oral, Daily, Clair Marolyn NOVAK, RPH, 17 g at 08/17/24 9171   rilpivirine  (EDURANT ) tablet 25 mg, 25 mg, Oral, Q supper, Ravishankar, Jayashree, MD, 25 mg at 08/16/24 1554   senna (SENOKOT) tablet 8.6 mg, 1 tablet, Oral, BID PRN, Ouma, Elizabeth Achieng, NP   senna (SENOKOT) tablet 8.6 mg, 1 tablet, Oral, BID, Clair Marolyn NOVAK, RPH, 8.6 mg at 08/17/24 9173   thiamine  (VITAMIN B1) tablet 100 mg, 100 mg, Oral, Daily, Aleskerov, Fuad, MD, 100 mg at 08/17/24 [phone]

## 2024-08-17 NOTE — Telephone Encounter (Addendum)
 Pharmacy Patient Advocate Encounter  Insurance verification completed.    Per email from Jori Baptist: Patient has been approved with [employer] as of [phone].  She will have full coverage for these meds if they are called into [facility].  The number is [phone].  She has also been set up with ID provider, Dr. Elsie Lunger, at [facility] for an appt on Feb 3.

## 2024-08-17 NOTE — Progress Notes (Signed)
 "  Rounding Note   Patient Name: Claire Day Date of Encounter: 08/17/2024  [facility] Cardiologist: [facility]  Subjective No chest pain, dyspnea, palpitations, dizziness, presyncope or significant.  Renal function improving from 3.92-3.69 this morning.  Potassium mildly elevated at 5.2.  Scheduled Meds:  B-complex with vitamin C  1 tablet Oral Daily   calcium  carbonate  1 tablet Oral TID WC   carvedilol   3.125 mg Oral BID WC   dapsone   100 mg Oral QHS   darunavir -cobicistat   1 tablet Oral Q supper   doxycycline   100 mg Oral Q12H   feeding supplement  237 mL Oral BID BM   heparin   5,000 Units Subcutaneous Q8H   insulin  aspart  0-15 Units Subcutaneous Q4H   ipratropium-albuterol   3 mL Nebulization BID   isosorbide -hydrALAZINE   1 tablet Oral TID   mirtazapine   15 mg Oral QHS   multivitamin with minerals  1 tablet Oral Daily   polyethylene glycol  17 g Oral Daily   rilpivirine   25 mg Oral Q supper   senna  1 tablet Oral BID   thiamine   100 mg Oral Daily   Continuous Infusions:  dextrose  40 mL/hr at 08/17/24 0834   PRN Meds: acetaminophen , alum & mag hydroxide-simeth, ipratropium-albuterol , menthol , ondansetron  (ZOFRAN ) IV, mouth rinse, phenol, polyethylene glycol, senna   Vital Signs  Vitals:   08/17/24 0500 08/17/24 0806 08/17/24 0837 08/17/24 1207  BP:   (!) 148/105 (!) 149/94  Pulse:   (!) 118 (!) 101  Resp:   16 16  Temp:   (!) 97.5 F (36.4 C) 97.8 F (36.6 C)  TempSrc:    Oral  SpO2:  (P) 100% 92% 100%  Weight: 44.6 kg       Intake/Output Summary (Last 24 hours) at 08/17/2024 1421 Last data filed at 08/17/2024 1300 Gross per 24 hour  Intake 960 ml  Output --  Net 960 ml      08/17/2024    5:00 AM 08/16/2024    4:40 AM 08/15/2024    5:00 AM  Last 3 Weights  Weight (lbs) 98 lb 5.2 oz 99 lb 3.3 oz 98 lb 5.2 oz  Weight (kg) 44.6 kg 45 kg 44.6 kg      Telemetry V-pacing with rates in the 80s to low 100s bpm - Personally Reviewed  ECG  No new  tracings - Personally Reviewed  Physical Exam  GEN: Chronically ill and frail appearing, no acute distress  Neck: No JVD Cardiac: RRR, no murmurs, rubs, or gallops  Respiratory: Clear to auscultation bilaterally GI: Soft, nontender, non-distended  MS: No edema; No deformity; warm Neuro:  Nonfocal  Psych: Normal affect   Labs High Sensitivity Troponin:  No results for input(s): TROPONINIHS in the last 720 hours.  Recent Labs  Lab 08/13/24 0855 08/13/24 1714 08/13/24 2150 08/14/24 2155 08/14/24 2358  TRNPT 110* 123* 126* 132* 129*       Chemistry Recent Labs  Lab 08/13/24 0411 08/13/24 1714 08/15/24 0346 08/16/24 0458 08/16/24 1331 08/17/24 0622  NA 141   < > 141 144 145 149*  K 4.1   < > 5.0 5.1 5.1 5.2*  CL 109   < > 109 112* 111 114*  CO2 18*   < > 21* 25 22 25   GLUCOSE 248*   < > 90 80 101* 110*  BUN 21*   < > 46* 52* 46* 39*  CREATININE 1.37*   < > 3.48* 4.02* 3.92* 3.69*  CALCIUM  8.5*   < > 8.4* 8.7* 8.8* 8.4*  MG  --    < > 2.3 2.5*  --  2.1  PROT 5.9*  --   --   --   --   --   ALBUMIN 3.3*  --   --   --   --   --   AST 36  --   --   --   --   --   ALT 17  --   --   --   --   --   ALKPHOS 129*  --   --   --   --   --   BILITOT 0.2  --   --   --   --   --   GFRNONAA 45*   < > 15* 12* 13* 14*  ANIONGAP 14   < > 10 7 12 10    < > = values in this interval not displayed.    Lipids No results for input(s): CHOL, TRIG, HDL, LABVLDL, LDLCALC, CHOLHDL in the last 168 hours.  Hematology Recent Labs  Lab 08/15/24 0346 08/16/24 0458 08/17/24 0622  WBC 9.2 7.1 5.9  RBC 3.90 4.63 4.45  HGB 11.4* 13.4 12.8  HCT 35.4* 41.3 40.1  MCV 90.8 89.2 90.1  MCH 29.2 28.9 28.8  MCHC 32.2 32.4 31.9  RDW 15.0 15.4 15.6*  PLT 141* 168 157   Thyroid No results for input(s): TSH, FREET4 in the last 168 hours.  BNP Recent Labs  Lab 08/13/24 0509  PROBNP 5,944.0*    DDimer No results for input(s): DDIMER in the last 168 hours.   Radiology  No  results found.  Cardiac Studies  2D echo 08/13/2024: 1. Left ventricular ejection fraction, by estimation, is 25 to 30%. Left  ventricular ejection fraction by 3D volume is 24 %. The left ventricle has  severely decreased function. The left ventricle demonstrates global  hypokinesis. There is mild left  ventricular hypertrophy. Left ventricular diastolic parameters are  consistent with Grade I diastolic dysfunction (impaired relaxation). The  average left ventricular global longitudinal strain is -6.1 %. The global  longitudinal strain is abnormal.   2. Right ventricular systolic function is normal. The right ventricular  size is normal. There is normal pulmonary artery systolic pressure.   3. The mitral valve is normal in structure. No evidence of mitral valve  regurgitation. No evidence of mitral stenosis.   4. The aortic valve is normal in structure. Aortic valve regurgitation is  moderate. Aortic valve sclerosis/calcification is present, without any  evidence of aortic stenosis.  __________  2D echo 02/13/2023 ([facility]):  1. Left ventricle  The left ventricular cavity size is normal. Wall     thickness is normal. Systolic function is normal by the biplane method of     disks. The ejection fraction is 63%  +--5% . No segmental wall motion     abnormalities. Inconclusive diastolic evaluation due to conflicting data.  2. Atrial septum  There is no evidence of intracardiac shunt by color     Doppler or following injection of agitated saline with maneuvers.  3. Aortic valve  There is mild to moderate aortic regurgitation.  4. Tricuspid valve  There is mild tricuspid regurgitation.  5. Pulmonary arteries  Pulmonary artery estimated peak systolic pressure     30mm Hg. This is consistent with normal pulmonary artery pressure.  No previous study was available for comparison.   Patient Profile   Ms. Pascale  Day is a 59 year old woman with history of HFrEF secondary to NICM s/p CRT-D  2019, HIV, CKD stage III, cerebral aneurysm, and COPD with ongoing tobacco use who was admitted with acute respiratory distress following recent admission for influenza A, COVID-19, and COPD exacerbation with current admission felt to be multifactorial including COPD exacerbation, suspected pneumonia, and cardiomyopathy with admission complicated by AKI on CKD stage III.  Assessment & Plan  Acute on chronic HFrEF Long history of NICM s/p CRT-D Cardiomyopathy dating back more than 5 years, EF as low as 25 to 30%  - Echo 01/2023 showing  EF > 55% - Echo this admission with drop in EF back to 25 to 30%, global hypokinesis - proBNP 5944, up from last admission 07/25/2024 with proBNP 3282  - Chest x-ray without pulmonary edema and effusion - IV fluids started by medical team for renal failure, though no improvement in renal function  -Low threshold to stop IV fluids for any worsening shortness of breath -Appears mildly short of breath on exam, concerning for CHF, will avoid addition of beta-blockers for now - Continue low-dose BiDil  - Add low-dose carvedilol  3.125 mg twice daily, precautions discussed - Escalate GDMT as able based on BP and renal function - No plans for ischemic workup at this time given multiple comorbidities, prior cardiac CTA with no coronary disease - Now that she is living in the [place] area, she will need to establish with cardiology and EP locally - If she remains admitted, anticipate EP device interrogation early next week  Acute hypoxic hypercapnic respiratory failure,COPD Pneumonia - Initially requiring BiPAP, secondary to worsening respiratory status requiring intubation - Extubated 1/20 - Improving - Management per IM   AKI on CKD stage III - Creatinine 1.37 on admission, peaking at 4.02 - Currently, 3.69 - Possibly ATN in the setting of severe sepsis/septic shock requiring vasopressor  - Medical renal disease on ultrasound - Avoid nephrotoxic agents -  Consider involving nephrology    For questions or updates, please contact [facility] Please consult [email or web address] for contact info under     Signed, Bernardino Bring, PA-C  08/17/2024, 2:21 PM    "

## 2024-08-17 NOTE — Plan of Care (Signed)
" °  Problem: Coping: Goal: Ability to adjust to condition or change in health will improve Outcome: Progressing   Problem: Skin Integrity: Goal: Risk for impaired skin integrity will decrease Outcome: Progressing   Problem: Tissue Perfusion: Goal: Adequacy of tissue perfusion will improve Outcome: Progressing   Problem: Education: Goal: Knowledge of General Education information will improve Description: Including pain rating scale, medication(s)/side effects and non-pharmacologic comfort measures Outcome: Progressing   Problem: Health Behavior/Discharge Planning: Goal: Ability to manage health-related needs will improve Outcome: Progressing   Problem: Clinical Measurements: Goal: Will remain free from infection Outcome: Progressing Goal: Diagnostic test results will improve Outcome: Progressing Goal: Respiratory complications will improve Outcome: Progressing Goal: Cardiovascular complication will be avoided Outcome: Progressing   Problem: Activity: Goal: Risk for activity intolerance will decrease Outcome: Progressing   Problem: Coping: Goal: Level of anxiety will decrease Outcome: Progressing   Problem: Elimination: Goal: Will not experience complications related to bowel motility Outcome: Progressing Goal: Will not experience complications related to urinary retention Outcome: Progressing   Problem: Pain Managment: Goal: General experience of comfort will improve and/or be controlled Outcome: Progressing   Problem: Safety: Goal: Ability to remain free from injury will improve Outcome: Progressing   Problem: Skin Integrity: Goal: Risk for impaired skin integrity will decrease Outcome: Progressing   "

## 2024-08-17 NOTE — Progress Notes (Signed)
 "  Date of Admission:  08/13/2024     ID: ANARIA KRONER is a 59 y.o. female  Principal Problem:   Acute on chronic respiratory failure with hypoxia and hypercapnia (HCC) Active Problems:   HIV infection (HCC)   Protein-calorie malnutrition, severe   Dilated cardiomyopathy (HCC)   ICD (implantable cardioverter-defibrillator) in place  OAKLEIGH HESKETH is a 59 y.o. female with a history of HIV/AIDS diagnosed in 2002 on darunavir /cobicistat  and rilpivirine , COPD, nonischemic cardiomyopathy, LBBB, AICD/pacemaker, CKD, nephrolithiasis was recently in the hospital with COPD exacerbation and also had flu A and COVID infections.  She was treated with Tamiflu  under HIV medications were continued.  She was discharged on 07/27/2024 She presents to the ED he on 08/13/2024 with shortness of breath and tripoding.  She had received magnesium  IV Vitals in the ED BP of 156/107, heart rate 134, respiratory 36, sats 68% and temperature was 98.4.  she was emergently intubated.  Subjective: Doing okay   Medications:   B-complex with vitamin C  1 tablet Oral Daily   calcium  carbonate  1 tablet Oral TID WC   carvedilol   3.125 mg Oral BID WC   dapsone   100 mg Oral QHS   darunavir -cobicistat   1 tablet Oral Q supper   doxycycline   100 mg Oral Q12H   feeding supplement  237 mL Oral BID BM   heparin   5,000 Units Subcutaneous Q8H   insulin  aspart  0-15 Units Subcutaneous Q4H   ipratropium-albuterol   3 mL Nebulization BID   isosorbide -hydrALAZINE   1 tablet Oral TID   mirtazapine   15 mg Oral QHS   multivitamin with minerals  1 tablet Oral Daily   polyethylene glycol  17 g Oral Daily   rilpivirine   25 mg Oral Q supper   senna  1 tablet Oral BID   thiamine   100 mg Oral Daily    Objective: Vital signs in last 24 hours: Patient Vitals for the past 24 hrs:  BP Temp Temp src Pulse Resp SpO2 Weight  08/17/24 1207 (!) 149/94 97.8 F (36.6 C) Oral (!) 101 16 100 % --  08/17/24 0837 (!) 148/105 (!) 97.5 F (36.4  C) -- (!) 118 16 92 % --  08/17/24 0806 -- -- -- -- -- (P) 100 % --  08/17/24 0500 -- -- -- -- -- -- 44.6 kg  08/17/24 0325 123/76 98 F (36.7 C) Oral 99 18 100 % --  08/16/24 2329 135/86 97.8 F (36.6 C) Oral (!) 117 17 100 % --  08/16/24 1932 -- -- -- -- -- 96 % --  08/16/24 1928 (!) 136/95 98 F (36.7 C) -- (!) 113 16 96 % --  08/16/24 1603 (!) 168/98 98.8 F (37.1 C) Oral (!) 109 18 -- --       PHYSICAL EXAM:  General: sitting in chair- alert emaciated  Lungs: b/l air entry Heart: Regular rate and rhythm, no murmur, rub or gallop. Abdomen: Soft, non-tender,not distended. Bowel sounds normal. No masses Extremities: atraumatic, no cyanosis. No edema. No clubbing Skin: No rashes or lesions. Or bruising Lymph: Cervical, supraclavicular normal. Neurologic: Grossly non-focal  Lab Results    Latest Ref Rng & Units 08/17/2024    6:22 AM 08/16/2024    4:58 AM 08/15/2024    3:46 AM  CBC  WBC 4.0 - 10.5 K/uL 5.9  7.1  9.2   Hemoglobin 12.0 - 15.0 g/dL 87.1  86.5  88.5   Hematocrit 36.0 - 46.0 % 40.1  41.3  35.4   Platelets 150 - 400 K/uL 157  168  141        Latest Ref Rng & Units 08/17/2024    6:22 AM 08/16/2024    1:31 PM 08/16/2024    4:58 AM  CMP  Glucose 70 - 99 mg/dL 889  898  80   BUN 6 - 20 mg/dL 39  46  52   Creatinine 0.44 - 1.00 mg/dL 6.30  6.07  5.97   Sodium 135 - 145 mmol/L 149  145  144   Potassium 3.5 - 5.1 mmol/L 5.2  5.1  5.1   Chloride 98 - 111 mmol/L 114  111  112   CO2 22 - 32 mmol/L 25  22  25    Calcium  8.9 - 10.3 mg/dL 8.4  8.8  8.7       Microbiology: Resp culture on 08/13/24 Resp pathogen 20 neg  Studies/Results: CXR 08/13/24  Hyperinflated lung fields- no infiltrate  No results found.    Assessment/Plan: Acute hypoxic and hypercapneic resp falilure due to COPD exacerbation- s/p extubation No clinical or CXR evidence of PJP or any pneumonia Currently on doxycycline -to give for total of 5 days 08/17/24   AIDS Viral load was  undetectable in March 2025 and CD4 count was 66.  Patient was taking darunavir /cobicistat  plus rilpivirine .  Her last prescription refill was on 06/08/2024 Current HIV RNA 2240  and CD4 count is 73- on Dapsone  for PJP prophylaxis Restarted HAART Enrolled [employer]  Seen by case manager Jori Baptist [phone] Pt states she has HAART meds new supply at home  AKI on CKD improving slowly  Renal US  b/l nephrolithiasis - mild dilatation  of rt renal collecting system  ? Recent hospitalization for COPD exacerbation in December 2025 Also had influenza A and COVID then   Nonischemic cardiomyopathy pacemaker/AICD      Chronic thrombocytopenia   Past history of PJP   Discussed the management with the patient and ID pharmacist  ID will sign off- call if needed   "

## 2024-08-17 NOTE — Progress Notes (Signed)
 Mobility Specialist - Progress Note   08/17/24 1527  Mobility  Activity Ambulated with assistance  Level of Assistance Standby assist, set-up cues, supervision of patient - no hands on  Assistive Device None  Distance Ambulated (ft) 12 ft  Activity Response Tolerated well  Mobility visit 1 Mobility  Mobility Specialist Start Time (ACUTE ONLY) 1521  Mobility Specialist Stop Time (ACUTE ONLY) 1524  Mobility Specialist Time Calculation (min) (ACUTE ONLY) 3 min   Pt supine upon entry, utilizing [place]. She completed bed mob indep, amb to/from the [facility]G-SBA-- no physical assist required, mild dyspnea.. Pt returned to the bed, left supine with alarm set and needs within reach.  America Silvan Mobility Specialist 08/17/24 3:29 PM

## 2024-08-17 NOTE — Plan of Care (Signed)

## 2024-08-17 NOTE — Progress Notes (Signed)
 "   NAME:  Claire Day, MRN:  [id number], DOB:  07-07-1966, LOS: 4 ADMISSION DATE:  08/13/2024, CONSULTATION DATE:  08/13/24 REFERRING MD: Fernand, CHIEF COMPLAINT:  shortness of breath   HPI  59 year old female with PMH significant for HIV (low CD4), COPD (not on home O?), active tobacco use, NICM with HFrEF s/p AICD, HTN, CKD III, cerebral aneurysm, prior TIA, recent Influenza A and COVID-19 infection presenting to the ED with complaints of acute respiratory distress.  On chart review patient with recent admission (07/25/24-07/27/24) for acute respiratory failure secondary to AECOPD, Influenza A, and COVID-19, treated with steroids, antivirals, antibiotics, and BiPAP, discharged on room air. She re-presented today via EMS with severe shortness of breath. On EMS arrival, she was tripoding and speaking in short sentences and was placed on BiPAP, receiving IV magnesium  and Solu-Medrol  125 mg en route. 08/17/24- patient seen at bedside. She's on 3L/min [place].  She is usually not on oxygen at home.  We wil continue care of her COPD on outpatient basis.  For now lets continue duonebPRN but I will add Breo200 once daily  Past Medical History  HIV (low CD4) COPD NICM/HFrEF s/p AICD Hypertension CKD stage III Cerebral aneurysm Prior TIA Myocardial infarction LBBB Hyperlipidemia Allergic rhinitis Anemia Moderate mitral regurgitation Pneumonia (Streptococcus) Nephrolithiasis/hydronephrosis Squamous cell carcinoma in situ (cervix) Thrombocytopenia Tobacco use  Significant Hospital Events   1/19:Admit to ICU with Acute hypercapnic/hypoxemic RF due to AECOPD requiring mechanical ventilation.  Consults:  ID  Procedures:  1/19: Endotrachael Intubation  Interim History / Subjective:    -see significant events  Micro Data:  08/13/24 SARS-CoV-2 PCR ? 08/13/24 Flu PCR ? 08/13/24 RVP ? 08/13/24 BCx2 ? 08/13/24 UCx ? 08/13/24 MRSA PCR ? 08/13/24 Strep pneumo Ur Ag ? 08/13/24 Legionella Ur Ag  ?   Antimicrobials:   Anti-infectives (From admission, onward)    Start     Dose/Rate Route Frequency Ordered Stop   08/15/24 2200  dapsone  tablet 100 mg        100 mg Oral Daily at bedtime 08/15/24 1235     08/15/24 1000  doxycycline  (VIBRA -TABS) tablet 100 mg        100 mg Oral Every 12 hours 08/15/24 0753 08/17/24 2359   08/14/24 1715  darunavir -cobicistat  (PREZCOBIX ) 800-150 MG per tablet 1 tablet        1 tablet Oral Daily with supper 08/14/24 1623     08/14/24 1715  rilpivirine  (EDURANT ) tablet 25 mg        25 mg Oral Daily with supper 08/14/24 1623     08/14/24 1400  piperacillin -tazobactam (ZOSYN ) IVPB 2.25 g  Status:  Discontinued        2.25 g 100 mL/hr over 30 Minutes Intravenous Every 8 hours 08/14/24 0750 08/15/24 1509   08/14/24 0600  ceFEPIme  (MAXIPIME ) 2 g in sodium chloride  0.9 % 100 mL IVPB  Status:  Discontinued        2 g 200 mL/hr over 30 Minutes Intravenous Every 24 hours 08/13/24 0745 08/13/24 1134   08/13/24 2200  doxycycline  (VIBRAMYCIN ) 100 mg in sodium chloride  0.9 % 250 mL IVPB  Status:  Discontinued        100 mg 125 mL/hr over 120 Minutes Intravenous Every 12 hours 08/13/24 1025 08/15/24 0753   08/13/24 1300  piperacillin -tazobactam (ZOSYN ) IVPB 3.375 g  Status:  Discontinued        3.375 g 12.5 mL/hr over 240 Minutes Intravenous Every 8 hours 08/13/24 1134 08/14/24 0750  08/13/24 0430  vancomycin  (VANCOCIN ) IVPB 1000 mg/200 mL premix        1,000 mg 200 mL/hr over 60 Minutes Intravenous  Once 08/13/24 0415 08/13/24 1002   08/13/24 0430  ceFEPIme  (MAXIPIME ) 2 g in sodium chloride  0.9 % 100 mL IVPB        2 g 200 mL/hr over 30 Minutes Intravenous  Once 08/13/24 0415 08/13/24 0627   08/13/24 0430  azithromycin (ZITHROMAX) 500 mg in sodium chloride  0.9 % 250 mL IVPB  Status:  Discontinued        500 mg 250 mL/hr over 60 Minutes Intravenous  Once 08/13/24 0415 08/13/24 0418   08/13/24 0430  doxycycline  (VIBRAMYCIN ) 100 mg in sodium chloride  0.9 % 250 mL  IVPB        100 mg 125 mL/hr over 120 Minutes Intravenous  Once 08/13/24 0418 08/13/24 1136       OBJECTIVE  Blood pressure (!) 148/105, pulse (!) 118, temperature (!) 97.5 F (36.4 C), resp. rate 16, weight 44.6 kg, SpO2 92%.        Intake/Output Summary (Last 24 hours) at 08/17/2024 1042 Last data filed at 08/17/2024 0020 Gross per 24 hour  Intake 720 ml  Output --  Net 720 ml   Filed Weights   08/15/24 0500 08/16/24 0440 08/17/24 0500  Weight: 44.6 kg 45 kg 44.6 kg   Physical Examination  GEN: Critically ill patient, intubated and sedated HEENT: [place]/AT. PERRL, sclerae anicteric. HEART: regular rhythm, normal rate, S1, S2, no M/R/G,  LUNGS: CTAB, diminished bilateral without wheezes, no increased WOB,  EXTREMITIES: No Edema, cap refill  NEURO: No gross focal deficits. PSYCH:  UTA ABDOMINAL: Soft: BS x 4, NTND SKIN: Intact, warm, no rashes lesion, or ulcer  Active Hospital Problem list   See systems below  Assessment & Plan:   #AECOPD Hx of COPD; smoker  -Daily SAT/SBT when appropriate -Trend Xray & ABGs, target permissive hypercapnia -Nicotine  replacement therapy -wean off o2 -PT/OT -IS  #CAP - finish bactrim x7d bid   #AKI on CKD stage III: AKI likely iso above Cr 1.37 (baseline CKD) -Follow BMP+Mag -Ensure adequate renal perfusion -Avoid nephrotoxic  -Replace lytes -strict I&O  #NICM/HFrEF s/p AICD  #s/p AICDHTNHLD Last Echo 01/2023 recovered EF >65%,  -Euvolemic on exam.  -BNP: 5944 -Chest XR (1/18): No pulmonary edema -Hold Home GDMT  -Strict I/Os -Obtain 2Decho -Hold Diuretics -Low threshold for cards consult  #Hyperglycemia Glucose 248 Likely stress and steroid related no hx of DM -Check A1c -CBG's q4hrs  -SSI -Target CBG readings 140 to 180 -Follow hypo/hyperglycemic protocol   #HIV/AIDS -Check Viral load , CD4 count -Per Last ID notes on prezcobix  and rilpavirine   -Consult ID for HIV hx -Plan to restart HARRT once able to  tolerate p.o.  Best practice:  Diet:  NPO Pain/Anxiety/Delirium protocol (if indicated): Yes (RASS goal -1) VAP protocol (if indicated): Yes DVT prophylaxis: Subcutaneous Heparin  GI prophylaxis: H2B Glucose control:  SSI Yes Central venous access:  N/A Arterial line:  N/A Foley:  Yes, and it is still needed Mobility:  bed rest  PT/OT consulted: N/A Code Status:  full code Disposition: ICU   = Goals of Care =  Primary Emergency Contact: Clay,Tamicka, Home Phone: [phone]   Labs/imaging that I havepersonally reviewed  (right click and Reselect all SmartList Selections daily)   No results found.    Labs   CBC: Recent Labs  Lab 08/13/24 0411 08/14/24 9565 08/15/24 9653 08/16/24 0458 08/17/24 9377  WBC 9.5 11.7*  11.5* 9.2 7.1 5.9  NEUTROABS 4.2 8.9*  --   --   --   HGB 13.2 12.2  12.5 11.4* 13.4 12.8  HCT 42.2 38.2  37.6 35.4* 41.3 40.1  MCV 93.4 90.5  90 90.8 89.2 90.1  PLT 215 187  184 141* 168 157    Basic Metabolic Panel: Recent Labs  Lab 08/13/24 1714 08/14/24 0434 08/15/24 0346 08/16/24 0458 08/16/24 1331 08/17/24 0622  NA  --  139 141 144 145 149*  K 4.7 4.9 5.0 5.1 5.1 5.2*  CL  --  108 109 112* 111 114*  CO2  --  20* 21* 25 22 25   GLUCOSE  --  101* 90 80 101* 110*  BUN  --  37* 46* 52* 46* 39*  CREATININE  --  2.76* 3.48* 4.02* 3.92* 3.69*  CALCIUM   --  7.9* 8.4* 8.7* 8.8* 8.4*  MG 2.4 2.4 2.3 2.5*  --  2.1  PHOS  --  5.5* 5.8* 3.2  --  2.6   GFR: Estimated Creatinine Clearance: 11.7 mL/min (A) (by C-G formula based on SCr of 3.69 mg/dL (H)). Recent Labs  Lab 08/13/24 0411 08/13/24 0902 08/13/24 1108 08/13/24 2252 08/14/24 0434 08/15/24 0346 08/16/24 0458 08/17/24 0622  WBC 9.5  --   --   --  11.7*  11.5* 9.2 7.1 5.9  LATICACIDVEN 5.0* 1.4 2.8* 2.7*  --   --   --   --     Liver Function Tests: Recent Labs  Lab 08/13/24 0411  AST 36  ALT 17  ALKPHOS 129*  BILITOT 0.2  PROT 5.9*  ALBUMIN 3.3*   No results for  input(s): LIPASE, AMYLASE in the last 168 hours. No results for input(s): AMMONIA in the last 168 hours.  ABG    Component Value Date/Time   PHART 7.25 (L) 08/14/2024 0437   PCO2ART 44 08/14/2024 0437   PO2ART 95 08/14/2024 0437   HCO3 19.3 (L) 08/14/2024 0437   ACIDBASEDEF 7.7 (H) 08/14/2024 0437   O2SAT 98.4 08/14/2024 0437     Coagulation Profile: Recent Labs  Lab 08/13/24 0411  INR 1.1    Cardiac Enzymes: No results for input(s): CKTOTAL, CKMB, CKMBINDEX, TROPONINI in the last 168 hours.  HbA1C: Hgb A1c MFr Bld  Date/Time Value Ref Range Status  08/13/2024 04:11 AM 5.6 4.8 - 5.6 % Final    Comment:    (NOTE) Diagnosis of Diabetes The following HbA1c ranges recommended by the American Diabetes Association (ADA) may be used as an aid in the diagnosis of diabetes mellitus.  Hemoglobin             Suggested A1C NGSP%              Diagnosis  <5.7                   Non Diabetic  5.7-6.4                Pre-Diabetic  >6.4                   Diabetic  <7.0                   Glycemic control for                       adults with diabetes.      CBG: Recent Labs  Lab 08/16/24 1539 08/16/24 1932 08/16/24 2311  08/17/24 0322 08/17/24 0758  GLUCAP 108* 109* 84 89 89    Review of Systems:   Unable to be obtained secondary to the patient's intubated and sedated status.   Past Medical History  She,  has a past medical history of Allergic rhinitis, Anemia, Aortic insufficiency, Cerebral aneurysm (02/12/2023), Chronic systolic heart failure (HCC), CKD (chronic kidney disease), stage III (HCC), COPD (chronic obstructive pulmonary disease) (HCC), History of kidney stones, HIV (human immunodeficiency virus infection) (HCC) (2003), HLD (hyperlipidemia), Hydronephrosis, Hypertension, LBBB (left bundle branch block), MI (myocardial infarction) (HCC), Moderate mitral regurgitation, Nephrolithiasis, NICM (nonischemic cardiomyopathy) (HCC), Pneumonia due to  Streptococcus, 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   Surgical History    Past Surgical History:  Procedure Laterality Date   BI-VENTRICULAR IMPLANTABLE CARDIOVERTER DEFIBRILLATOR  (CRT-D) Left 09/20/2017   Procedure: BI-VENTRICULAR IMPLANTABLE CARDIOVERTER DEFIBRILLATOR (CRT-D); Location: [facility]; Surgeon: Nancyann Ceo, MD   CHOLECYSTECTOMY     CYSTOSCOPY W/ URETERAL STENT PLACEMENT Left 06/10/2023   Procedure: CYSTOSCOPY WITH RETROGRADE PYELOGRAM/URETERAL STENT PLACEMENT;  Surgeon: Francisca Redell BROCKS, MD;  Location: [facility];  Service: Urology;  Laterality: Left;   CYSTOSCOPY/URETEROSCOPY/HOLMIUM LASER/STENT PLACEMENT Left 07/05/2023   Procedure: CYSTOSCOPY/URETEROSCOPY/HOLMIUM LASER/STENT EXCHANGE;  Surgeon: Twylla Glendia BROCKS, MD;  Location: [facility];  Service: Urology;  Laterality: Left;     Social History   reports that she has been smoking cigarettes. She does not have any smokeless tobacco history on file. She reports that she does not drink alcohol and does not use drugs.   Family History   Her family history is not on file.   Allergies Allergies[1]   Home Medications  Prior to Admission medications  Medication Sig Start Date End Date Taking? Authorizing Provider  albuterol  (VENTOLIN  HFA) 108 (90 Base) MCG/ACT inhaler Inhale 2 puffs into the lungs every 4 (four) hours as needed for wheezing or shortness of breath. 11/14/21   Cleotilde Redell, MD  dapsone  100 MG tablet Take 100 mg by mouth at bedtime.    [provider]  darunavir -cobicistat  (PREZCOBIX ) 800-150 MG tablet Take 1 tablet by mouth at bedtime. Swallow whole. Do NOT crush, break or chew tablets. Take with food.    [provider]  ipratropium-albuterol  (DUONEB) 0.5-2.5 (3) MG/3ML SOLN  Take 3 mLs by nebulization every 6 (six) hours as needed (Shortness of breath and wheezing). 12/05/23   Amin, Sumayya, MD  mirtazapine  (REMERON ) 15 MG tablet Take 15 mg by mouth at bedtime. 02/19/24   [provider]  rilpivirine  (EDURANT ) 25 MG TABS tablet Take 25 mg by mouth at bedtime.    [provider]  Tiotropium Bromide-Olodaterol (STIOLTO RESPIMAT) 2.5-2.5 MCG/ACT AERS Inhale 2 puffs into the lungs daily as needed (wheezing, shortness of breath).    [provider]   Scheduled Meds:  B-complex with vitamin C  1 tablet Oral Daily   calcium  carbonate  1 tablet Oral TID WC   dapsone   100 mg Oral QHS   darunavir -cobicistat   1 tablet Oral Q supper   doxycycline   100 mg Oral Q12H   feeding supplement  237 mL Oral BID BM   heparin   5,000 Units Subcutaneous Q8H   insulin  aspart  0-15 Units Subcutaneous Q4H   ipratropium-albuterol   3 mL Nebulization BID   isosorbide -hydrALAZINE   1 tablet Oral TID   mirtazapine   15 mg Oral QHS   multivitamin  with minerals  1 tablet Oral Daily   polyethylene glycol  17 g Oral Daily   rilpivirine   25 mg Oral Q supper   senna  1 tablet Oral BID   thiamine   100 mg Oral Daily   Continuous Infusions:  dextrose  40 mL/hr at 08/17/24 0834   PRN Meds:.acetaminophen , alum & mag hydroxide-simeth, ipratropium-albuterol , menthol , ondansetron  (ZOFRAN ) IV, mouth rinse, phenol, polyethylene glycol, senna      Dailey Buccheri, M.D.  Pulmonary & Critical Care Medicine              [1]  Allergies Allergen Reactions   Sulfonamide Derivatives Hives and Dermatitis   Tramadol Other (See Comments)    Upset stomach and it gave her the shakes    "

## 2024-08-18 DIAGNOSIS — J9621 Acute and chronic respiratory failure with hypoxia: Secondary | ICD-10-CM | POA: Diagnosis not present

## 2024-08-18 DIAGNOSIS — I10 Essential (primary) hypertension: Secondary | ICD-10-CM | POA: Diagnosis not present

## 2024-08-18 DIAGNOSIS — J9622 Acute and chronic respiratory failure with hypercapnia: Secondary | ICD-10-CM | POA: Diagnosis not present

## 2024-08-18 DIAGNOSIS — I5023 Acute on chronic systolic (congestive) heart failure: Secondary | ICD-10-CM | POA: Diagnosis not present

## 2024-08-18 LAB — CULTURE, BLOOD (ROUTINE X 2)
Culture: NO GROWTH
Culture: NO GROWTH
Special Requests: ADEQUATE
Special Requests: ADEQUATE

## 2024-08-18 LAB — GLUCOSE, CAPILLARY
Glucose-Capillary: 101 mg/dL — ABNORMAL HIGH (ref 70–99)
Glucose-Capillary: 101 mg/dL — ABNORMAL HIGH (ref 70–99)
Glucose-Capillary: 102 mg/dL — ABNORMAL HIGH (ref 70–99)
Glucose-Capillary: 134 mg/dL — ABNORMAL HIGH (ref 70–99)
Glucose-Capillary: 84 mg/dL (ref 70–99)
Glucose-Capillary: 85 mg/dL (ref 70–99)

## 2024-08-18 LAB — BASIC METABOLIC PANEL WITH GFR
Anion gap: 8 (ref 5–15)
BUN: 32 mg/dL — ABNORMAL HIGH (ref 6–20)
CO2: 25 mmol/L (ref 22–32)
Calcium: 8.3 mg/dL — ABNORMAL LOW (ref 8.9–10.3)
Chloride: 109 mmol/L (ref 98–111)
Creatinine, Ser: 3.01 mg/dL — ABNORMAL HIGH (ref 0.44–1.00)
GFR, Estimated: 17 mL/min — ABNORMAL LOW
Glucose, Bld: 102 mg/dL — ABNORMAL HIGH (ref 70–99)
Potassium: 4.7 mmol/L (ref 3.5–5.1)
Sodium: 142 mmol/L (ref 135–145)

## 2024-08-18 MED ORDER — FLUCONAZOLE 100 MG PO TABS
100.0000 mg | ORAL_TABLET | Freq: Every day | ORAL | Status: DC
  Administered 2024-08-18 – 2024-08-21 (×4): 100 mg via ORAL
  Filled 2024-08-18 (×4): qty 1

## 2024-08-18 MED ORDER — CARVEDILOL 12.5 MG PO TABS
12.5000 mg | ORAL_TABLET | Freq: Two times a day (BID) | ORAL | Status: DC
  Administered 2024-08-18 – 2024-08-19 (×2): 12.5 mg via ORAL
  Filled 2024-08-18 (×2): qty 1

## 2024-08-18 NOTE — Progress Notes (Signed)
 " PROGRESS NOTE    BABE ANTHIS  FMW:[id number] DOB: 23-Feb-1966 DOA: 08/13/2024 PCP: Meade Bigness, MD  Subjective: No acute events overnight. Seen and examined at bedside. Reports mild improvement in appetite and tolerating food with some oral pain at roof of mouth. No new complaints. Denies nausea, vomiting, constipation.    Hospital Course: 59 year old female with PMH significant for HIV (low CD4), COPD (not on home O?), active tobacco use, NICM with HFrEF s/p AICD, HTN, CKD III, cerebral aneurysm, prior TIA, recent Influenza A and COVID-19 infection presenting to the ED with complaints of acute respiratory distress.   On chart review patient with recent admission (07/25/24-07/27/24) for acute respiratory failure secondary to AECOPD, Influenza A, and COVID-19, treated with steroids, antivirals, antibiotics, and BiPAP, discharged on room air. She re-presented today via EMS with severe shortness of breath. On EMS arrival, she was tripoding and speaking in short sentences and was placed on BiPAP, receiving IV magnesium  and Solu-Medrol  125 mg en route. Patient treated DuoNeb, LR 500 mL bolus, antibiotics (cefepime  2 g IV, vanco 1 g IV, doxy 100  mg IV). Propofol  infusion for sedation. Intubated for mechanical ventilation. Admitted to ICU.    Patient treated with IV antibiotics, mechanical ventilation and vasopressors. Extubated and weaned off pressors and transferred to medicine floor.    Assessment and Plan:  Oral painful lesions - white plaques noted on hard palate which patient reports cause pain when eating and drinking without associated odynyphagia or dysphagia - etiology unclear, concern for oral candidiasis - will start fluconazole  for 14 day course   AKI on CKD stage III Bilateral nephrolithiasis Cr 3.01 < 4.02, baseline Cr 1-1.3 - etiology unclear, likely in the setting of severe sepsis/septic shock requiring vasopressors - FeNa 11.7% concerning for intrinsic renal disease - US   renal showed medical renal disease, bilateral nephrolithiasis (1.2cm R mid pole and 0.7cm L lower pole), mild fullness of R intrarenal collecting system - status post gentle IV fluids 1/21 - renal dose meds - Avoid nephrotoxic agents - Follow I/Os, renal function - consider nephrology consult if continues to worsen   Hypernatremia - likely due to dehydration from poor oral intake - asymptomatic at present - Na 142 < 149 - stop D5W - encourage oral intake/hydration - monitor BMP   NICM/HFrEF status post AICD HTN HLD Last Echo 01/2023 recovered EF >65%,  -Euvolemic on exam.  -BNP: 5944 -Chest XR (1/18): No pulmonary edema -2Decho 1/19 showed LVEF 25-30%, global hypokinesis, mild LVH, grade 1 diastolic dysfunction - cont BiDil  and coreg  - plan to start farxiga if renal function remains stable - Hold Diuretics for now - Strict I/Os - Cardiology following    Hypoglycemia Severe malnutrition Poor oral intake Noted to have Hyperglycemia earlier during MICU stay secondary to stress steroids with no hx of DM - HgbA1c 5.6 - encourage oral intake/hydration - cont home mirtazapine  - dietary supplements as per dietician recs - cont SSI with FS ACHS - Follow hypo/hyperglycemic protocol    HIV/AIDS PCP Pneumonia - CD4 count 73, CD4 % 7.3 - Per Last ID notes on prezcobix  and rilpavirine   - cont home darunavir -cobicistat  and rilpivirine  - cont home dapsone  - ID following  Acute Hypoxic Hypercapnic Respiratory Failure AECOPD CAP - resolved - status post mechanical ventilation. Extubated 1/20 - weaned off to room air, not on oxygen at home - cont duonebs - hold off on steroids for now -Nicotine  replacement therapy - cont incentive spirometry - OOB to chair during daytime -  encourage ambulation  Suspected Pneumonia  CXR: hyperinflation with subtle RLL patchy airspace disease Initial interventions/workup included: 2 L of NS/LR & cefepime , vancomycin , doxycycline  -  covid/influenza/RSV negative - resp viral panel negative - MRSA screen negative - strep urine Ag negative - resp Cx with few GPCs, final read pending - Bcx 1/19 showed NGTD, final read pending - finished doxycycline  5 day course on 1/23  DVT prophylaxis: heparin  injection 5,000 Units Start: 08/13/24 0600  SQ Heparin    Code Status: Full Code  Disposition Plan: CIR Reason for continuing need for hospitalization: monitor renal function, monitor for clinical improvement  Objective: Vitals:   08/17/24 1935 08/18/24 0301 08/18/24 0500 08/18/24 0751  BP: (!) 137/98 (!) 140/91  (!) 146/89  Pulse: (!) 105 94  97  Resp: 16 16  16   Temp: 98.4 F (36.9 C) 98.1 F (36.7 C)  97.7 F (36.5 C)  TempSrc:      SpO2: 100% 98%  100%  Weight:   43.2 kg     Intake/Output Summary (Last 24 hours) at 08/18/2024 1332 Last data filed at 08/17/2024 1912 Gross per 24 hour  Intake 416.86 ml  Output --  Net 416.86 ml   Filed Weights   08/16/24 0440 08/17/24 0500 08/18/24 0500  Weight: 45 kg 44.6 kg 43.2 kg    Examination:  Physical Exam Vitals and nursing note reviewed.  Constitutional:      General: She is not in acute distress.    Appearance: She is ill-appearing.  HENT:     Head: Normocephalic and atraumatic.  Cardiovascular:     Rate and Rhythm: Normal rate and regular rhythm.     Pulses: Normal pulses.     Heart sounds: Normal heart sounds.  Pulmonary:     Effort: Pulmonary effort is normal.     Breath sounds: Normal breath sounds.  Abdominal:     General: Bowel sounds are normal.     Palpations: Abdomen is soft.  Neurological:     Mental Status: She is alert.     Data Reviewed: I have personally reviewed following labs and imaging studies  CBC: Recent Labs  Lab 08/13/24 0411 08/14/24 0434 08/15/24 0346 08/16/24 0458 08/17/24 0622  WBC 9.5 11.7*  11.5* 9.2 7.1 5.9  NEUTROABS 4.2 8.9*  --   --   --   HGB 13.2 12.2  12.5 11.4* 13.4 12.8  HCT 42.2 38.2  37.6 35.4*  41.3 40.1  MCV 93.4 90.5  90 90.8 89.2 90.1  PLT 215 187  184 141* 168 157   Basic Metabolic Panel: Recent Labs  Lab 08/13/24 1714 08/14/24 0434 08/15/24 0346 08/16/24 0458 08/16/24 1331 08/17/24 0622 08/18/24 0433  NA  --  139 141 144 145 149* 142  K 4.7 4.9 5.0 5.1 5.1 5.2* 4.7  CL  --  108 109 112* 111 114* 109  CO2  --  20* 21* 25 22 25 25   GLUCOSE  --  101* 90 80 101* 110* 102*  BUN  --  37* 46* 52* 46* 39* 32*  CREATININE  --  2.76* 3.48* 4.02* 3.92* 3.69* 3.01*  CALCIUM   --  7.9* 8.4* 8.7* 8.8* 8.4* 8.3*  MG 2.4 2.4 2.3 2.5*  --  2.1  --   PHOS  --  5.5* 5.8* 3.2  --  2.6  --    GFR: Estimated Creatinine Clearance: 13.9 mL/min (A) (by C-G formula based on SCr of 3.01 mg/dL (H)). Liver Function Tests: Recent Labs  Lab 08/13/24 0411  AST 36  ALT 17  ALKPHOS 129*  BILITOT 0.2  PROT 5.9*  ALBUMIN 3.3*   No results for input(s): LIPASE, AMYLASE in the last 168 hours. No results for input(s): AMMONIA in the last 168 hours. Coagulation Profile: Recent Labs  Lab 08/13/24 0411  INR 1.1   Cardiac Enzymes: No results for input(s): CKTOTAL, CKMB, CKMBINDEX, TROPONINI in the last 168 hours. ProBNP, BNP (last 5 results) Recent Labs    12/02/23 0907 07/25/24 1516 08/13/24 0509  PROBNP  --  3,282.0* 5,944.0*  BNP 105.0*  --   --    HbA1C: No results for input(s): HGBA1C in the last 72 hours. CBG: Recent Labs  Lab 08/17/24 1935 08/18/24 0012 08/18/24 0310 08/18/24 0753 08/18/24 1214  GLUCAP 96 101* 102* 84 85   Lipid Profile: No results for input(s): CHOL, HDL, LDLCALC, TRIG, CHOLHDL, LDLDIRECT in the last 72 hours. Thyroid Function Tests: No results for input(s): TSH, T4TOTAL, FREET4, T3FREE, THYROIDAB in the last 72 hours. Anemia Panel: No results for input(s): VITAMINB12, FOLATE, FERRITIN, TIBC, IRON, RETICCTPCT in the last 72 hours. Sepsis Labs: Recent Labs  Lab 08/13/24 0411 08/13/24 0902  08/13/24 1108 08/13/24 2252  LATICACIDVEN 5.0* 1.4 2.8* 2.7*    Recent Results (from the past 240 hours)  Resp panel by RT-PCR (RSV, Flu A&B, Covid) Anterior Nasal Swab     Status: None   Collection Time: 08/13/24  4:11 AM   Specimen: Anterior Nasal Swab  Result Value Ref Range Status   SARS Coronavirus 2 by RT PCR NEGATIVE NEGATIVE Final    Comment: (NOTE) SARS-CoV-2 target nucleic acids are NOT DETECTED.  The SARS-CoV-2 RNA is generally detectable in upper respiratory specimens during the acute phase of infection. The lowest concentration of SARS-CoV-2 viral copies this assay can detect is 138 copies/mL. A negative result does not preclude SARS-Cov-2 infection and should not be used as the sole basis for treatment or other patient management decisions. A negative result may occur with  improper specimen collection/handling, submission of specimen other than nasopharyngeal swab, presence of viral mutation(s) within the areas targeted by this assay, and inadequate number of viral copies(<138 copies/mL). A negative result must be combined with clinical observations, patient history, and epidemiological information. The expected result is Negative.  Fact Sheet for Patients:  bloggercourse.com  Fact Sheet for Healthcare Providers:  seriousbroker.it  This test is no t yet approved or cleared by the [place]  FDA and  has been authorized for detection and/or diagnosis of SARS-CoV-2 by FDA under an Emergency Use Authorization (EUA). This EUA will remain  in effect (meaning this test can be used) for the duration of the COVID-19 declaration under Section 564(b)(1) of the Act, 21 U.S.C.section 360bbb-3(b)(1), unless the authorization is terminated  or revoked sooner.       Influenza A by PCR NEGATIVE NEGATIVE Final   Influenza B by PCR NEGATIVE NEGATIVE Final    Comment: (NOTE) The Xpert Xpress SARS-CoV-2/FLU/RSV plus assay  is intended as an aid in the diagnosis of influenza from Nasopharyngeal swab specimens and should not be used as a sole basis for treatment. Nasal washings and aspirates are unacceptable for Xpert Xpress SARS-CoV-2/FLU/RSV testing.  Fact Sheet for Patients: bloggercourse.com  Fact Sheet for Healthcare Providers: seriousbroker.it  This test is not yet approved or cleared by the [place]  FDA and has been authorized for detection and/or diagnosis of SARS-CoV-2 by FDA under an Emergency Use Authorization (EUA). This EUA will remain in  effect (meaning this test can be used) for the duration of the COVID-19 declaration under Section 564(b)(1) of the Act, 21 U.S.C. section 360bbb-3(b)(1), unless the authorization is terminated or revoked.     Resp Syncytial Virus by PCR NEGATIVE NEGATIVE Final    Comment: (NOTE) Fact Sheet for Patients: bloggercourse.com  Fact Sheet for Healthcare Providers: seriousbroker.it  This test is not yet approved or cleared by the [place]  FDA and has been authorized for detection and/or diagnosis of SARS-CoV-2 by FDA under an Emergency Use Authorization (EUA). This EUA will remain in effect (meaning this test can be used) for the duration of the COVID-19 declaration under Section 564(b)(1) of the Act, 21 U.S.C. section 360bbb-3(b)(1), unless the authorization is terminated or revoked.  Performed at [facility], [address] [place], [place] [address]   Blood Culture (routine x 2)     Status: None   Collection Time: 08/13/24  4:11 AM   Specimen: BLOOD  Result Value Ref Range Status   Specimen Description BLOOD BLOOD LEFT FOREARM  Final   Special Requests   Final    BOTTLES DRAWN AEROBIC AND ANAEROBIC Blood Culture adequate volume   Culture   Final    NO GROWTH 5 DAYS Performed at [facility], [address],  [place], [place] [address]    Report Status 08/18/2024 FINAL  Final  Blood Culture (routine x 2)     Status: None   Collection Time: 08/13/24  4:11 AM   Specimen: BLOOD  Result Value Ref Range Status   Specimen Description BLOOD LEFT ANTECUBITAL  Final   Special Requests   Final    BOTTLES DRAWN AEROBIC AND ANAEROBIC Blood Culture adequate volume   Culture   Final    NO GROWTH 5 DAYS Performed at [facility], [address], [place], [place] [address]    Report Status 08/18/2024 FINAL  Final  Urine Culture     Status: Abnormal   Collection Time: 08/13/24  4:11 AM   Specimen: Urine, Random  Result Value Ref Range Status   Specimen Description   Final    URINE, RANDOM Performed at [facility], [address] [place], [place] [address]    Special Requests   Final    URINE, CLEAN CATCH Performed at [facility], [address] [place], [place] [address]    Culture >=100,000 COLONIES/mL ESCHERICHIA COLI (A)  Final   Report Status 08/15/2024 FINAL  Final   Organism ID, Bacteria ESCHERICHIA COLI (A)  Final      Susceptibility   Escherichia coli - MIC*    AMPICILLIN <=2 SENSITIVE Sensitive     CEFAZOLIN (URINE) Value in next row Sensitive      4 SENSITIVEThis is a modified FDA-approved test that has been validated and its performance characteristics determined by the reporting laboratory.  This laboratory is certified under the Clinical Laboratory Improvement Amendments CLIA as qualified to perform high complexity clinical laboratory testing.    CEFEPIME  Value in next row Sensitive      4 SENSITIVEThis is a modified FDA-approved test that has been validated and its performance characteristics determined by the reporting laboratory.  This laboratory is certified under the Clinical Laboratory Improvement Amendments CLIA as qualified to perform high complexity clinical laboratory testing.    ERTAPENEM Value in next row Sensitive      4 SENSITIVEThis is a modified  FDA-approved test that has been validated and its performance characteristics determined by the  reporting laboratory.  This laboratory is certified under the Clinical Laboratory Improvement Amendments CLIA as qualified to perform high complexity clinical laboratory testing.    CEFTRIAXONE  Value in next row Sensitive      4 SENSITIVEThis is a modified FDA-approved test that has been validated and its performance characteristics determined by the reporting laboratory.  This laboratory is certified under the Clinical Laboratory Improvement Amendments CLIA as qualified to perform high complexity clinical laboratory testing.    CIPROFLOXACIN Value in next row Sensitive      4 SENSITIVEThis is a modified FDA-approved test that has been validated and its performance characteristics determined by the reporting laboratory.  This laboratory is certified under the Clinical Laboratory Improvement Amendments CLIA as qualified to perform high complexity clinical laboratory testing.    GENTAMICIN Value in next row Sensitive      4 SENSITIVEThis is a modified FDA-approved test that has been validated and its performance characteristics determined by the reporting laboratory.  This laboratory is certified under the Clinical Laboratory Improvement Amendments CLIA as qualified to perform high complexity clinical laboratory testing.    NITROFURANTOIN Value in next row Sensitive      4 SENSITIVEThis is a modified FDA-approved test that has been validated and its performance characteristics determined by the reporting laboratory.  This laboratory is certified under the Clinical Laboratory Improvement Amendments CLIA as qualified to perform high complexity clinical laboratory testing.    TRIMETH/SULFA Value in next row Resistant      4 SENSITIVEThis is a modified FDA-approved test that has been validated and its performance characteristics determined by the reporting laboratory.  This laboratory is certified under the Clinical  Laboratory Improvement Amendments CLIA as qualified to perform high complexity clinical laboratory testing.    AMPICILLIN/SULBACTAM Value in next row Sensitive      4 SENSITIVEThis is a modified FDA-approved test that has been validated and its performance characteristics determined by the reporting laboratory.  This laboratory is certified under the Clinical Laboratory Improvement Amendments CLIA as qualified to perform high complexity clinical laboratory testing.    PIP/TAZO Value in next row Sensitive      <=4 SENSITIVEThis is a modified FDA-approved test that has been validated and its performance characteristics determined by the reporting laboratory.  This laboratory is certified under the Clinical Laboratory Improvement Amendments CLIA as qualified to perform high complexity clinical laboratory testing.    MEROPENEM Value in next row Sensitive      <=4 SENSITIVEThis is a modified FDA-approved test that has been validated and its performance characteristics determined by the reporting laboratory.  This laboratory is certified under the Clinical Laboratory Improvement Amendments CLIA as qualified to perform high complexity clinical laboratory testing.    * >=100,000 COLONIES/mL ESCHERICHIA COLI  MRSA Next Gen by PCR, Nasal     Status: None   Collection Time: 08/13/24  6:30 AM   Specimen: Nasal Mucosa; Nasal Swab  Result Value Ref Range Status   MRSA by PCR Next Gen NOT DETECTED NOT DETECTED Final    Comment: (NOTE) The GeneXpert MRSA Assay (FDA approved for NASAL specimens only), is one component of a comprehensive MRSA colonization surveillance program. It is not intended to diagnose MRSA infection nor to guide or monitor treatment for MRSA infections. Test performance is not FDA approved in patients less than 68 years old. Performed at [facility], [address] [place], [place] [address]   Respiratory (~20 pathogens) panel by PCR     Status: None  Collection Time:  08/13/24  6:43 AM   Specimen: Nasopharyngeal Swab; Respiratory  Result Value Ref Range Status   Adenovirus NOT DETECTED NOT DETECTED Final   Coronavirus 229E NOT DETECTED NOT DETECTED Final    Comment: (NOTE) The Coronavirus on the Respiratory Panel, DOES NOT test for the novel  Coronavirus (2019 nCoV)    Coronavirus HKU1 NOT DETECTED NOT DETECTED Final   Coronavirus NL63 NOT DETECTED NOT DETECTED Final   Coronavirus OC43 NOT DETECTED NOT DETECTED Final   Metapneumovirus NOT DETECTED NOT DETECTED Final   Rhinovirus / Enterovirus NOT DETECTED NOT DETECTED Final   Influenza A NOT DETECTED NOT DETECTED Final   Influenza B NOT DETECTED NOT DETECTED Final   Parainfluenza Virus 1 NOT DETECTED NOT DETECTED Final   Parainfluenza Virus 2 NOT DETECTED NOT DETECTED Final   Parainfluenza Virus 3 NOT DETECTED NOT DETECTED Final   Parainfluenza Virus 4 NOT DETECTED NOT DETECTED Final   Respiratory Syncytial Virus NOT DETECTED NOT DETECTED Final   Bordetella pertussis NOT DETECTED NOT DETECTED Final   Bordetella Parapertussis NOT DETECTED NOT DETECTED Final   Chlamydophila pneumoniae NOT DETECTED NOT DETECTED Final   Mycoplasma pneumoniae NOT DETECTED NOT DETECTED Final    Comment: Performed at [facility], [address], [place], [place] [address]  Culture, Respiratory w Gram Stain     Status: None   Collection Time: 08/13/24  3:54 PM   Specimen: Tracheal Aspirate; Respiratory  Result Value Ref Range Status   Specimen Description   Final    TRACHEAL ASPIRATE Performed at [facility], [address] [place], [place] [address]    Special Requests   Final    NONE Performed at [facility], [address] [place], [place] [address]    Gram Stain   Final    FEW WBC PRESENT, PREDOMINANTLY PMN FEW GRAM POSITIVE COCCI    Culture   Final    FEW Normal respiratory flora-no Staph aureus or Pseudomonas seen Performed at [facility], [address]  [place], [place] [address]    Report Status 08/16/2024 FINAL  Final     Radiology Studies: No results found.  Scheduled Meds:  B-complex with vitamin C  1 tablet Oral Daily   calcium  carbonate  1 tablet Oral TID WC   carvedilol   12.5 mg Oral BID WC   dapsone   100 mg Oral QHS   darunavir -cobicistat   1 tablet Oral Q supper   feeding supplement  237 mL Oral BID BM   heparin   5,000 Units Subcutaneous Q8H   insulin  aspart  0-15 Units Subcutaneous Q4H   ipratropium-albuterol   3 mL Nebulization BID   isosorbide -hydrALAZINE   1 tablet Oral TID   mirtazapine   15 mg Oral QHS   multivitamin with minerals  1 tablet Oral Daily   polyethylene glycol  17 g Oral Daily   rilpivirine   25 mg Oral Q supper   senna  1 tablet Oral BID   thiamine   100 mg Oral Daily   Continuous Infusions:   LOS: 5 days   Norval Bar, MD  [facility]  08/18/2024, 1:32 PM   "

## 2024-08-18 NOTE — Plan of Care (Signed)

## 2024-08-18 NOTE — Progress Notes (Signed)
 "  Rounding Note   Patient Name: Claire Day Date of Encounter: 08/18/2024  [facility] Cardiologist: None   Subjective Feeling fine.  Short of breath with ambulation but otherwise well.   Scheduled Meds:  B-complex with vitamin C  1 tablet Oral Daily   calcium  carbonate  1 tablet Oral TID WC   carvedilol   3.125 mg Oral BID WC   dapsone   100 mg Oral QHS   darunavir -cobicistat   1 tablet Oral Q supper   feeding supplement  237 mL Oral BID BM   heparin   5,000 Units Subcutaneous Q8H   insulin  aspart  0-15 Units Subcutaneous Q4H   ipratropium-albuterol   3 mL Nebulization BID   isosorbide -hydrALAZINE   1 tablet Oral TID   mirtazapine   15 mg Oral QHS   multivitamin with minerals  1 tablet Oral Daily   polyethylene glycol  17 g Oral Daily   rilpivirine   25 mg Oral Q supper   senna  1 tablet Oral BID   thiamine   100 mg Oral Daily   Continuous Infusions:  PRN Meds: acetaminophen , alum & mag hydroxide-simeth, ipratropium-albuterol , menthol , ondansetron  (ZOFRAN ) IV, mouth rinse, phenol, polyethylene glycol, senna   Vital Signs  Vitals:   08/17/24 1935 08/18/24 0301 08/18/24 0500 08/18/24 0751  BP: (!) 137/98 (!) 140/91  (!) 146/89  Pulse: (!) 105 94  97  Resp: 16 16  16   Temp: 98.4 F (36.9 C) 98.1 F (36.7 C)  97.7 F (36.5 C)  TempSrc:      SpO2: 100% 98%  100%  Weight:   43.2 kg     Intake/Output Summary (Last 24 hours) at 08/18/2024 0918 Last data filed at 08/17/2024 1912 Gross per 24 hour  Intake 656.86 ml  Output --  Net 656.86 ml      08/18/2024    5:00 AM 08/17/2024    5:00 AM 08/16/2024    4:40 AM  Last 3 Weights  Weight (lbs) 95 lb 3.8 oz 98 lb 5.2 oz 99 lb 3.3 oz  Weight (kg) 43.2 kg 44.6 kg 45 kg      Telemetry ASVP - Personally Reviewed  ECG  N/a - Personally Reviewed  Echo 07/2024: 1. Left ventricular ejection fraction, by estimation, is 25 to 30%. Left  ventricular ejection fraction by 3D volume is 24 %. The left ventricle has   severely decreased function. The left ventricle demonstrates global  hypokinesis. There is mild left  ventricular hypertrophy. Left ventricular diastolic parameters are  consistent with Grade I diastolic dysfunction (impaired relaxation). The  average left ventricular global longitudinal strain is -6.1 %. The global  longitudinal strain is abnormal.   2. Right ventricular systolic function is normal. The right ventricular  size is normal. There is normal pulmonary artery systolic pressure.   3. The mitral valve is normal in structure. No evidence of mitral valve  regurgitation. No evidence of mitral stenosis.   4. The aortic valve is normal in structure. Aortic valve regurgitation is  moderate. Aortic valve sclerosis/calcification is present, without any  evidence of aortic stenosis.   Physical Exam  VS:  BP (!) 146/89 (BP Location: Left Arm)   Pulse 97   Temp 97.7 F (36.5 C)   Resp 16   Wt 43.2 kg   SpO2 100%   BMI 14.48 kg/m  , BMI Body mass index is 14.48 kg/m. GENERAL:  Frail.  No acute distress HEENT: Pupils equal round and reactive, fundi not visualized, oral mucosa unremarkable NECK:  No jugular venous distention, waveform within normal limits, carotid upstroke brisk and symmetric, no bruits, no thyromegaly LUNGS:  Clear to auscultation bilaterally HEART:  RRR.  PMI not displaced or sustained,S1 and S2 within normal limits, no S3, no S4, no clicks, no rubs, no murmurs ABD:  Flat, positive bowel sounds normal in frequency in pitch, no bruits, no rebound, no guarding, no midline pulsatile mass, no hepatomegaly, no splenomegaly EXT:  2 plus pulses throughout, no edema, no cyanosis no clubbing SKIN:  No rashes no nodules NEURO:  Cranial nerves II through XII grossly intact, motor grossly intact throughout PSYCH:  Cognitively intact, oriented to person place and time   Labs High Sensitivity Troponin:  No results for input(s): TROPONINIHS in the last 720 hours.  Recent  Labs  Lab 08/13/24 0855 08/13/24 1714 08/13/24 2150 08/14/24 2155 08/14/24 2358  TRNPT 110* 123* 126* 132* 129*       Chemistry Recent Labs  Lab 08/13/24 0411 08/13/24 1714 08/15/24 0346 08/16/24 0458 08/16/24 1331 08/17/24 0622 08/18/24 0433  NA 141   < > 141 144 145 149* 142  K 4.1   < > 5.0 5.1 5.1 5.2* 4.7  CL 109   < > 109 112* 111 114* 109  CO2 18*   < > 21* 25 22 25 25   GLUCOSE 248*   < > 90 80 101* 110* 102*  BUN 21*   < > 46* 52* 46* 39* 32*  CREATININE 1.37*   < > 3.48* 4.02* 3.92* 3.69* 3.01*  CALCIUM  8.5*   < > 8.4* 8.7* 8.8* 8.4* 8.3*  MG  --    < > 2.3 2.5*  --  2.1  --   PROT 5.9*  --   --   --   --   --   --   ALBUMIN 3.3*  --   --   --   --   --   --   AST 36  --   --   --   --   --   --   ALT 17  --   --   --   --   --   --   ALKPHOS 129*  --   --   --   --   --   --   BILITOT 0.2  --   --   --   --   --   --   GFRNONAA 45*   < > 15* 12* 13* 14* 17*  ANIONGAP 14   < > 10 7 12 10 8    < > = values in this interval not displayed.    Lipids No results for input(s): CHOL, TRIG, HDL, LABVLDL, LDLCALC, CHOLHDL in the last 168 hours.  Hematology Recent Labs  Lab 08/15/24 0346 08/16/24 0458 08/17/24 0622  WBC 9.2 7.1 5.9  RBC 3.90 4.63 4.45  HGB 11.4* 13.4 12.8  HCT 35.4* 41.3 40.1  MCV 90.8 89.2 90.1  MCH 29.2 28.9 28.8  MCHC 32.2 32.4 31.9  RDW 15.0 15.4 15.6*  PLT 141* 168 157   Thyroid No results for input(s): TSH, FREET4 in the last 168 hours.  BNP Recent Labs  Lab 08/13/24 0509  PROBNP 5,944.0*    DDimer No results for input(s): DDIMER in the last 168 hours.   Radiology  No results found.  Cardiac Studies Echo 08/13/24: 1. Left ventricular ejection fraction, by estimation, is 25 to 30%. Left  ventricular ejection fraction by 3D volume is  24 %. The left ventricle has  severely decreased function. The left ventricle demonstrates global  hypokinesis. There is mild left  ventricular hypertrophy. Left ventricular  diastolic parameters are  consistent with Grade I diastolic dysfunction (impaired relaxation). The  average left ventricular global longitudinal strain is -6.1 %. The global  longitudinal strain is abnormal.   2. Right ventricular systolic function is normal. The right ventricular  size is normal. There is normal pulmonary artery systolic pressure.   3. The mitral valve is normal in structure. No evidence of mitral valve  regurgitation. No evidence of mitral stenosis.   4. The aortic valve is normal in structure. Aortic valve regurgitation is  moderate. Aortic valve sclerosis/calcification is present, without any  evidence of aortic stenosis.    Patient Profile   59 y.o. female with HFrEF, NICM, CKD 3, COPD, ongoing tobacco abuse, admitted with respiratory distress after recent hospitalization for Influenza A and COVID-19.   Assessment & Plan   #HFrEF:  # NICM:  # Hypertension:  LVEF 25-30%.  She appears to be euvolemic.  Holding diuresis in the setting of acute on chronic renal failure.  BP is uncontrolled. We will increased carvedilol  to 12.5mg  bid.  Continue Bidil .  Add SGLT2i as renal function improves. Needs ARB/ARNI if renal function permits.   # Tobacco abuse: Encourage cessation.       For questions or updates, please contact [facility] Please consult [email or web address] for contact info under       Signed, Annabella Scarce, MD  08/18/2024, 9:18 AM    "

## 2024-08-18 NOTE — Progress Notes (Signed)
 Physical Therapy Treatment Patient Details Name: Claire Day MRN: [id number] DOB: 02/08/1966 Today's Date: 08/18/2024   History of Present Illness Pt is a 59 yo female that presented to ED for acute respiratory distress. Initially placed on bipap but intubated and admitted to ICU (extubated 1/20). PMH of HIV (low CD4), COPD (not on home O2), active tobacco use, NICM with HFrEF s/p AICD, HTN, CKD III, cerebral aneurysm, prior TIA, recent Influenza A and COVID-19 infection    PT Comments  Pt is progressing with basic mobility and functional balance in sitting and standing, performing at an overall Supervision level.  Gait training: No LOB, slow but no tandem gait. Working on increasing activity tolerance and dynamic balance with AD, but pt would be more stable overall with RW.  Pt continues to need supplemental O2 to maintain SPO2 > 90%.  Pt on 1-2L O2  SPO2 dropped to 80% increasing to  91% with seated rest < .   Recommend intensive skilled PT intervention to maximize outcomes and return pt to PLOF, pt is motivated.      If plan is discharge home, recommend the following: A little help with walking and/or transfers;A little help with bathing/dressing/bathroom;Assistance with cooking/housework;Assist for transportation;Help with stairs or ramp for entrance   Can travel by private vehicle        Equipment Recommendations  Rolling walker (2 wheels);BSC/3in1    Recommendations for Other Services       Precautions / Restrictions Precautions Precautions: Fall Recall of Precautions/Restrictions: Intact Restrictions Weight Bearing Restrictions Per Provider Order: No     Mobility  Bed Mobility Overal bed mobility: Needs Assistance, Modified Independent Bed Mobility: Supine to Sit     Supine to sit: HOB elevated, Used rails, Modified independent (Device/Increase time)     General bed mobility comments: extra time to complete; no cue needed    Transfers Overall transfer level:  Needs assistance Equipment used: None Transfers: Sit to/from Stand, Bed to chair/wheelchair/BSC Sit to Stand: Supervision   Step pivot transfers: Supervision       General transfer comment: steadying without AD    Ambulation/Gait Ambulation/Gait assistance: Supervision Gait Distance (Feet): 165 Feet Assistive device: None Gait Pattern/deviations: Decreased stride length, Decreased dorsiflexion - right, Decreased dorsiflexion - left       General Gait Details: No LOB, slow but no tandem gait. Working on increasing activity tolerance and dynamic balance with AD but pt would be more stable overall with RW.   Stairs             Wheelchair Mobility     Tilt Bed    Modified Rankin (Stroke Patients Only)       Balance Overall balance assessment: Independent Sitting-balance support: Feet supported, No upper extremity supported Sitting balance-Leahy Scale: Good     Standing balance support: No upper extremity supported, During functional activity Standing balance-Leahy Scale: Good Standing balance comment: a few standing balance exercises without UE support feet apart and eyes open, supervision no LOB.  Pt does need to continue to work on dynamic standing balance with narrow BOS.                            Communication Communication Communication: No apparent difficulties  Cognition Arousal: Alert Behavior During Therapy: [facility] for tasks assessed/performed  Following commands: Intact      Cueing Cueing Techniques: Verbal cues  Exercises      General Comments        Pertinent Vitals/Pain Pain Assessment Pain Assessment: No/denies pain    Home Living                          Prior Function            PT Goals (current goals can now be found in the care plan section) Acute Rehab PT Goals Patient Stated Goal: to go home PT Goal Formulation: With patient Time For Goal Achievement:  08/29/24 Potential to Achieve Goals: Good Progress towards PT goals: Progressing toward goals    Frequency    Min 3X/week      PT Plan      Co-evaluation              AM-PAC PT 6 Clicks Mobility   Outcome Measure  Help needed turning from your back to your side while in a flat bed without using bedrails?: None Help needed moving from lying on your back to sitting on the side of a flat bed without using bedrails?: A Little Help needed moving to and from a bed to a chair (including a wheelchair)?: A Little Help needed standing up from a chair using your arms (e.g., wheelchair or bedside chair)?: A Little Help needed to walk in hospital room?: A Little Help needed climbing 3-5 steps with a railing? : A Little 6 Click Score: 19    End of Session Equipment Utilized During Treatment: Gait belt Activity Tolerance: Patient tolerated treatment well Patient left: in chair;with call bell/phone within reach;with chair alarm set Nurse Communication: Mobility status PT Visit Diagnosis: Other abnormalities of gait and mobility (R26.89);Difficulty in walking, not elsewhere classified (R26.2);Muscle weakness (generalized) (M62.81)     Time: 8674-8654 PT Time Calculation (min) (ACUTE ONLY): 20 min  Charges:    $Therapeutic Activity: 8-22 mins PT General Charges $$ ACUTE PT VISIT: 1 Visit                     Harland Irving, PTA  08/18/24, 2:02 PM

## 2024-08-19 DIAGNOSIS — J9622 Acute and chronic respiratory failure with hypercapnia: Secondary | ICD-10-CM | POA: Diagnosis not present

## 2024-08-19 DIAGNOSIS — I10 Essential (primary) hypertension: Secondary | ICD-10-CM | POA: Diagnosis not present

## 2024-08-19 DIAGNOSIS — I5023 Acute on chronic systolic (congestive) heart failure: Secondary | ICD-10-CM | POA: Diagnosis not present

## 2024-08-19 DIAGNOSIS — J9621 Acute and chronic respiratory failure with hypoxia: Secondary | ICD-10-CM | POA: Diagnosis not present

## 2024-08-19 LAB — BLOOD GAS, ARTERIAL
Acid-base deficit: 7.7 mmol/L — ABNORMAL HIGH (ref 0.0–2.0)
Bicarbonate: 19.3 mmol/L — ABNORMAL LOW (ref 20.0–28.0)
FIO2: 35 %
MECHVT: 400 mL
Mechanical Rate: 24
O2 Saturation: 98.4 %
PEEP: 5 cmH2O
Patient temperature: 37
pCO2 arterial: 44 mmHg (ref 32–48)
pH, Arterial: 7.25 — ABNORMAL LOW (ref 7.35–7.45)
pO2, Arterial: 95 mmHg (ref 83–108)

## 2024-08-19 LAB — BASIC METABOLIC PANEL WITH GFR
Anion gap: 8 (ref 5–15)
BUN: 28 mg/dL — ABNORMAL HIGH (ref 6–20)
CO2: 25 mmol/L (ref 22–32)
Calcium: 8.7 mg/dL — ABNORMAL LOW (ref 8.9–10.3)
Chloride: 110 mmol/L (ref 98–111)
Creatinine, Ser: 2.66 mg/dL — ABNORMAL HIGH (ref 0.44–1.00)
GFR, Estimated: 20 mL/min — ABNORMAL LOW
Glucose, Bld: 86 mg/dL (ref 70–99)
Potassium: 4.7 mmol/L (ref 3.5–5.1)
Sodium: 142 mmol/L (ref 135–145)

## 2024-08-19 LAB — GLUCOSE, CAPILLARY
Glucose-Capillary: 104 mg/dL — ABNORMAL HIGH (ref 70–99)
Glucose-Capillary: 109 mg/dL — ABNORMAL HIGH (ref 70–99)
Glucose-Capillary: 78 mg/dL (ref 70–99)
Glucose-Capillary: 82 mg/dL (ref 70–99)
Glucose-Capillary: 83 mg/dL (ref 70–99)
Glucose-Capillary: 89 mg/dL (ref 70–99)

## 2024-08-19 MED ORDER — CARVEDILOL 6.25 MG PO TABS
6.2500 mg | ORAL_TABLET | Freq: Two times a day (BID) | ORAL | Status: DC
  Administered 2024-08-19 – 2024-08-21 (×4): 6.25 mg via ORAL
  Filled 2024-08-19 (×4): qty 1

## 2024-08-19 NOTE — Plan of Care (Signed)

## 2024-08-19 NOTE — Progress Notes (Signed)
 "  Rounding Note   Patient Name: Claire Day Date of Encounter: 08/19/2024  [facility] Cardiologist: None   Subjective No acute overnight cardiac events. BP overall stable. Renal function improved today from 3.01 to 2.66.   Scheduled Meds:  B-complex with vitamin C  1 tablet Oral Daily   calcium  carbonate  1 tablet Oral TID WC   carvedilol   12.5 mg Oral BID WC   dapsone   100 mg Oral QHS   darunavir -cobicistat   1 tablet Oral Q supper   feeding supplement  237 mL Oral BID BM   fluconazole   100 mg Oral Daily   heparin   5,000 Units Subcutaneous Q8H   insulin  aspart  0-15 Units Subcutaneous Q4H   ipratropium-albuterol   3 mL Nebulization BID   isosorbide -hydrALAZINE   1 tablet Oral TID   mirtazapine   15 mg Oral QHS   multivitamin with minerals  1 tablet Oral Daily   polyethylene glycol  17 g Oral Daily   rilpivirine   25 mg Oral Q supper   senna  1 tablet Oral BID   thiamine   100 mg Oral Daily   Continuous Infusions:  PRN Meds: acetaminophen , alum & mag hydroxide-simeth, ipratropium-albuterol , menthol , ondansetron  (ZOFRAN ) IV, mouth rinse, phenol, polyethylene glycol, senna   Vital Signs  Vitals:   08/19/24 0500 08/19/24 0519 08/19/24 0724 08/19/24 0748  BP:  110/75  128/89  Pulse:  80  83  Resp:  16  18  Temp:  98.6 F (37 C)  98 F (36.7 C)  TempSrc:      SpO2:  98% 99% 100%  Weight: 43.5 kg       Intake/Output Summary (Last 24 hours) at 08/19/2024 0804 Last data filed at 08/18/2024 2130 Gross per 24 hour  Intake 600 ml  Output --  Net 600 ml      08/19/2024    5:00 AM 08/18/2024    5:00 AM 08/17/2024    5:00 AM  Last 3 Weights  Weight (lbs) 95 lb 14.4 oz 95 lb 3.8 oz 98 lb 5.2 oz  Weight (kg) 43.5 kg 43.2 kg 44.6 kg      Telemetry See MD attestation   ECG  No new tracings - Personally Reviewed  Physical Exam  VS:  BP 128/89 (BP Location: Right Arm)   Pulse 83   Temp 98 F (36.7 C)   Resp 18   Wt 43.5 kg   SpO2 100%   BMI 14.58  kg/m  , BMI Body mass index is 14.58 kg/m.  See MD attestation    Labs High Sensitivity Troponin:  No results for input(s): TROPONINIHS in the last 720 hours.  Recent Labs  Lab 08/13/24 0855 08/13/24 1714 08/13/24 2150 08/14/24 2155 08/14/24 2358  TRNPT 110* 123* 126* 132* 129*       Chemistry Recent Labs  Lab 08/13/24 0411 08/13/24 1714 08/15/24 0346 08/16/24 0458 08/16/24 1331 08/17/24 0622 08/18/24 0433 08/19/24 0528  NA 141   < > 141 144   < > 149* 142 142  K 4.1   < > 5.0 5.1   < > 5.2* 4.7 4.7  CL 109   < > 109 112*   < > 114* 109 110  CO2 18*   < > 21* 25   < > 25 25 25   GLUCOSE 248*   < > 90 80   < > 110* 102* 86  BUN 21*   < > 46* 52*   < > 39* 32*  28*  CREATININE 1.37*   < > 3.48* 4.02*   < > 3.69* 3.01* 2.66*  CALCIUM  8.5*   < > 8.4* 8.7*   < > 8.4* 8.3* 8.7*  MG  --    < > 2.3 2.5*  --  2.1  --   --   PROT 5.9*  --   --   --   --   --   --   --   ALBUMIN 3.3*  --   --   --   --   --   --   --   AST 36  --   --   --   --   --   --   --   ALT 17  --   --   --   --   --   --   --   ALKPHOS 129*  --   --   --   --   --   --   --   BILITOT 0.2  --   --   --   --   --   --   --   GFRNONAA 45*   < > 15* 12*   < > 14* 17* 20*  ANIONGAP 14   < > 10 7   < > 10 8 8    < > = values in this interval not displayed.    Lipids No results for input(s): CHOL, TRIG, HDL, LABVLDL, LDLCALC, CHOLHDL in the last 168 hours.  Hematology Recent Labs  Lab 08/15/24 0346 08/16/24 0458 08/17/24 0622  WBC 9.2 7.1 5.9  RBC 3.90 4.63 4.45  HGB 11.4* 13.4 12.8  HCT 35.4* 41.3 40.1  MCV 90.8 89.2 90.1  MCH 29.2 28.9 28.8  MCHC 32.2 32.4 31.9  RDW 15.0 15.4 15.6*  PLT 141* 168 157   Thyroid No results for input(s): TSH, FREET4 in the last 168 hours.  BNP Recent Labs  Lab 08/13/24 0509  PROBNP 5,944.0*    DDimer No results for input(s): DDIMER in the last 168 hours.   Radiology  No results found.  Cardiac Studies Echo 08/13/24: 1. Left  ventricular ejection fraction, by estimation, is 25 to 30%. Left  ventricular ejection fraction by 3D volume is 24 %. The left ventricle has  severely decreased function. The left ventricle demonstrates global  hypokinesis. There is mild left  ventricular hypertrophy. Left ventricular diastolic parameters are  consistent with Grade I diastolic dysfunction (impaired relaxation). The  average left ventricular global longitudinal strain is -6.1 %. The global  longitudinal strain is abnormal.   2. Right ventricular systolic function is normal. The right ventricular  size is normal. There is normal pulmonary artery systolic pressure.   3. The mitral valve is normal in structure. No evidence of mitral valve  regurgitation. No evidence of mitral stenosis.   4. The aortic valve is normal in structure. Aortic valve regurgitation is  moderate. Aortic valve sclerosis/calcification is present, without any  evidence of aortic stenosis.    Patient Profile   59 y.o. female with HFrEF secondary to NICM s/p CRT-D 2019, HIV, CKD stage III, cerebral aneurysm, and COPD with ongoing tobacco use who was admitted with acute respiratory distress following recent admission for influenza A, COVID-19, and COPD exacerbation with current admission felt to be multifactorial including COPD exacerbation, suspected pneumonia, and cardiomyopathy with admission complicated by AKI on CKD stage III.    Assessment & Plan   #HFrEF:  # NICM:  #  Hypertension:  - Echo 01/2023 showing  EF > 55% - Echo this admission with drop in EF back to 25 to 30%, global hypokinesis - proBNP 5944, up from last admission 07/25/2024 with proBNP 3282  - Chest x-ray without pulmonary edema and effusion - IV fluids started by medical team for renal failure, though no improvement in renal function initially  - Now on Coreg  12.5 mg bid and BiDil  - Escalate GDMT with addition of ARB/ARNI and SGLT2i as able based on BP and renal function - No plans  for ischemic workup at this time given multiple comorbidities, prior cardiac CTA with no coronary disease - Now that she is living in the [place] area, she will need to establish with cardiology and EP locally   # Acute hypoxic hypercapnic respiratory failure,COPD # Pneumonia - Initially requiring BiPAP, secondary to worsening respiratory status requiring intubation - Extubated 1/20 - Improving - Management per IM   # AKI on CKD stage III - Creatinine 1.37 on admission, peaking at 4.02 - Currently, improving at 2.66 - Possibly ATN in the setting of severe sepsis/septic shock requiring vasopressor  - Medical renal disease on ultrasound - Avoid nephrotoxic agents - Per IM, consider involving nephrology  # Tobacco abuse: Encourage cessation.       For questions or updates, please contact [facility] Please consult [email or web address] for contact info under       Signed, Bernardino Bring, PA-C  08/19/2024, 8:04 AM    "

## 2024-08-19 NOTE — Plan of Care (Signed)
   Problem: Education: Goal: Ability to describe self-care measures that may prevent or decrease complications (Diabetes Survival Skills Education) will improve Outcome: Progressing   Problem: Metabolic: Goal: Ability to maintain appropriate glucose levels will improve Outcome: Progressing   Problem: Nutritional: Goal: Maintenance of adequate nutrition will improve Outcome: Progressing   Problem: Skin Integrity: Goal: Risk for impaired skin integrity will decrease Outcome: Progressing

## 2024-08-19 NOTE — Progress Notes (Signed)
 " PROGRESS NOTE    Claire Day  FMW:[id number] DOB: 06-Feb-1966 DOA: 08/13/2024 PCP: Meade Bigness, MD  Subjective: No acute events overnight. Seen and examined at bedside. Reports appetite continues to be better but still with mild oral pain at roof of mouth. No new complaints. Denies nausea, vomiting, constipation.    Hospital Course: 59 year old female with PMH significant for HIV (low CD4), COPD (not on home O?), active tobacco use, NICM with HFrEF s/p AICD, HTN, CKD III, cerebral aneurysm, prior TIA, recent Influenza A and COVID-19 infection presenting to the ED with complaints of acute respiratory distress.   On chart review patient with recent admission (07/25/24-07/27/24) for acute respiratory failure secondary to AECOPD, Influenza A, and COVID-19, treated with steroids, antivirals, antibiotics, and BiPAP, discharged on room air. She re-presented today via EMS with severe shortness of breath. On EMS arrival, she was tripoding and speaking in short sentences and was placed on BiPAP, receiving IV magnesium  and Solu-Medrol  125 mg en route. Patient treated DuoNeb, LR 500 mL bolus, antibiotics (cefepime  2 g IV, vanco 1 g IV, doxy 100  mg IV). Propofol  infusion for sedation. Intubated for mechanical ventilation. Admitted to ICU.    Patient treated with IV antibiotics, mechanical ventilation and vasopressors. Extubated and weaned off pressors and transferred to medicine floor.    Assessment and Plan:  AKI on CKD stage III Bilateral nephrolithiasis Cr 2.66 < 4.02, baseline Cr 1-1.3 - etiology unclear, likely in the setting of severe sepsis/septic shock requiring vasopressors - FeNa 11.7% concerning for intrinsic renal disease - US  renal showed medical renal disease, bilateral nephrolithiasis (1.2cm R mid pole and 0.7cm L lower pole), mild fullness of R intrarenal collecting system - status post gentle IV fluids and D5W earlier  - renal dose meds - Avoid nephrotoxic agents - Follow I/Os,  renal function - consider nephrology consult if continues to worsen   NICM/HFrEF status post AICD HTN HLD Last Echo 01/2023 recovered EF >65%,  -Euvolemic on exam.  -BNP: 5944 -Chest XR (1/18): No pulmonary edema -2Decho 1/19 showed LVEF 25-30%, global hypokinesis, mild LVH, grade 1 diastolic dysfunction - cont BiDil  and coreg  - plan to start ACEi/ARB and farxiga if renal function remains stable - Hold Diuretics for now - Strict I/Os - Cardiology following   Oral Candidiasis - white plaques noted on hard palate which patient reports cause pain when eating and drinking without associated odynyphagia or dysphagia - etiology unclear, concern for oral candidiasis - cont fluconazole  for 14 day course   HIV/AIDS PCP Pneumonia - CD4 count 73, CD4 % 7.3 - Per Last ID notes on prezcobix  and rilpavirine   - cont home darunavir -cobicistat  and rilpivirine  - cont home dapsone  - ID following  Hypoglycemia Severe malnutrition Poor oral intake Noted to have Hyperglycemia earlier during MICU stay secondary to stress steroids with no hx of DM - HgbA1c 5.6 - encourage oral intake/hydration - cont home mirtazapine  - dietary supplements as per dietician recs - cont SSI with FS ACHS - Follow hypo/hyperglycemic protocol   Hypernatremia - resolved - likely due to dehydration from poor oral intake - asymptomatic at present - Na 142 < 149 - stopped D5W - encourage oral intake/hydration - monitor BMP   Acute Hypoxic Hypercapnic Respiratory Failure AECOPD CAP - resolved - status post mechanical ventilation. Extubated 1/20 - weaned off to room air, not on oxygen at home - cont duonebs - hold off on steroids for now -Nicotine  replacement therapy - cont incentive spirometry - OOB to chair  during daytime - encourage ambulation   Suspected Pneumonia  CXR: hyperinflation with subtle RLL patchy airspace disease Initial interventions/workup included: 2 L of NS/LR & cefepime , vancomycin ,  doxycycline  - covid/influenza/RSV negative - resp viral panel negative - MRSA screen negative - strep urine Ag negative - resp Cx with few GPCs, final read pending - Bcx 1/19 showed NGTD, final read pending - finished doxycycline  5 day course on 1/23  DVT prophylaxis: heparin  injection 5,000 Units Start: 08/13/24 0600  SQ Heparin    Code Status: Full Code  Disposition Plan: CIR Reason for continuing need for hospitalization: monitor renal function, monitor for clinical improvement, cardiology recommendations   Objective: Vitals:   08/19/24 0500 08/19/24 0519 08/19/24 0724 08/19/24 0748  BP:  110/75  128/89  Pulse:  80  83  Resp:  16  18  Temp:  98.6 F (37 C)  98 F (36.7 C)  TempSrc:      SpO2:  98% 99% 100%  Weight: 43.5 kg       Intake/Output Summary (Last 24 hours) at 08/19/2024 1110 Last data filed at 08/19/2024 0951 Gross per 24 hour  Intake 840 ml  Output --  Net 840 ml   Filed Weights   08/17/24 0500 08/18/24 0500 08/19/24 0500  Weight: 44.6 kg 43.2 kg 43.5 kg    Examination:  Physical Exam Vitals and nursing note reviewed.  Constitutional:      General: She is not in acute distress.    Appearance: She is ill-appearing.     Comments: Weak, frail  HENT:     Head: Normocephalic and atraumatic.  Cardiovascular:     Rate and Rhythm: Normal rate and regular rhythm.     Pulses: Normal pulses.     Heart sounds: Normal heart sounds.  Pulmonary:     Effort: Pulmonary effort is normal.     Breath sounds: Normal breath sounds.  Abdominal:     General: Bowel sounds are normal. There is no distension.     Palpations: Abdomen is soft.     Tenderness: There is no abdominal tenderness.  Neurological:     Mental Status: She is alert.     Data Reviewed: I have personally reviewed following labs and imaging studies  CBC: Recent Labs  Lab 08/13/24 0411 08/14/24 0434 08/15/24 0346 08/16/24 0458 08/17/24 0622  WBC 9.5 11.7*  11.5* 9.2 7.1 5.9  NEUTROABS  4.2 8.9*  --   --   --   HGB 13.2 12.2  12.5 11.4* 13.4 12.8  HCT 42.2 38.2  37.6 35.4* 41.3 40.1  MCV 93.4 90.5  90 90.8 89.2 90.1  PLT 215 187  184 141* 168 157   Basic Metabolic Panel: Recent Labs  Lab 08/13/24 1714 08/14/24 0434 08/15/24 0346 08/16/24 0458 08/16/24 1331 08/17/24 0622 08/18/24 0433 08/19/24 0528  NA  --  139 141 144 145 149* 142 142  K 4.7 4.9 5.0 5.1 5.1 5.2* 4.7 4.7  CL  --  108 109 112* 111 114* 109 110  CO2  --  20* 21* 25 22 25 25 25   GLUCOSE  --  101* 90 80 101* 110* 102* 86  BUN  --  37* 46* 52* 46* 39* 32* 28*  CREATININE  --  2.76* 3.48* 4.02* 3.92* 3.69* 3.01* 2.66*  CALCIUM   --  7.9* 8.4* 8.7* 8.8* 8.4* 8.3* 8.7*  MG 2.4 2.4 2.3 2.5*  --  2.1  --   --   PHOS  --  5.5*  5.8* 3.2  --  2.6  --   --    GFR: Estimated Creatinine Clearance: 15.8 mL/min (A) (by C-G formula based on SCr of 2.66 mg/dL (H)). Liver Function Tests: Recent Labs  Lab 08/13/24 0411  AST 36  ALT 17  ALKPHOS 129*  BILITOT 0.2  PROT 5.9*  ALBUMIN 3.3*   No results for input(s): LIPASE, AMYLASE in the last 168 hours. No results for input(s): AMMONIA in the last 168 hours. Coagulation Profile: Recent Labs  Lab 08/13/24 0411  INR 1.1   Cardiac Enzymes: No results for input(s): CKTOTAL, CKMB, CKMBINDEX, TROPONINI in the last 168 hours. ProBNP, BNP (last 5 results) Recent Labs    12/02/23 0907 07/25/24 1516 08/13/24 0509  PROBNP  --  3,282.0* 5,944.0*  BNP 105.0*  --   --    HbA1C: No results for input(s): HGBA1C in the last 72 hours. CBG: Recent Labs  Lab 08/18/24 1536 08/18/24 2156 08/19/24 0014 08/19/24 0519 08/19/24 0745  GLUCAP 101* 134* 82 89 83   Lipid Profile: No results for input(s): CHOL, HDL, LDLCALC, TRIG, CHOLHDL, LDLDIRECT in the last 72 hours. Thyroid Function Tests: No results for input(s): TSH, T4TOTAL, FREET4, T3FREE, THYROIDAB in the last 72 hours. Anemia Panel: No results for input(s):  VITAMINB12, FOLATE, FERRITIN, TIBC, IRON, RETICCTPCT in the last 72 hours. Sepsis Labs: Recent Labs  Lab 08/13/24 0411 08/13/24 0902 08/13/24 1108 08/13/24 2252  LATICACIDVEN 5.0* 1.4 2.8* 2.7*    Recent Results (from the past 240 hours)  Resp panel by RT-PCR (RSV, Flu A&B, Covid) Anterior Nasal Swab     Status: None   Collection Time: 08/13/24  4:11 AM   Specimen: Anterior Nasal Swab  Result Value Ref Range Status   SARS Coronavirus 2 by RT PCR NEGATIVE NEGATIVE Final    Comment: (NOTE) SARS-CoV-2 target nucleic acids are NOT DETECTED.  The SARS-CoV-2 RNA is generally detectable in upper respiratory specimens during the acute phase of infection. The lowest concentration of SARS-CoV-2 viral copies this assay can detect is 138 copies/mL. A negative result does not preclude SARS-Cov-2 infection and should not be used as the sole basis for treatment or other patient management decisions. A negative result may occur with  improper specimen collection/handling, submission of specimen other than nasopharyngeal swab, presence of viral mutation(s) within the areas targeted by this assay, and inadequate number of viral copies(<138 copies/mL). A negative result must be combined with clinical observations, patient history, and epidemiological information. The expected result is Negative.  Fact Sheet for Patients:  bloggercourse.com  Fact Sheet for Healthcare Providers:  seriousbroker.it  This test is no t yet approved or cleared by the [place]  FDA and  has been authorized for detection and/or diagnosis of SARS-CoV-2 by FDA under an Emergency Use Authorization (EUA). This EUA will remain  in effect (meaning this test can be used) for the duration of the COVID-19 declaration under Section 564(b)(1) of the Act, 21 U.S.C.section 360bbb-3(b)(1), unless the authorization is terminated  or revoked sooner.        Influenza A by PCR NEGATIVE NEGATIVE Final   Influenza B by PCR NEGATIVE NEGATIVE Final    Comment: (NOTE) The Xpert Xpress SARS-CoV-2/FLU/RSV plus assay is intended as an aid in the diagnosis of influenza from Nasopharyngeal swab specimens and should not be used as a sole basis for treatment. Nasal washings and aspirates are unacceptable for Xpert Xpress SARS-CoV-2/FLU/RSV testing.  Fact Sheet for Patients: bloggercourse.com  Fact Sheet for Healthcare Providers: seriousbroker.it  This test is not yet approved or cleared by the [place]  FDA and has been authorized for detection and/or diagnosis of SARS-CoV-2 by FDA under an Emergency Use Authorization (EUA). This EUA will remain in effect (meaning this test can be used) for the duration of the COVID-19 declaration under Section 564(b)(1) of the Act, 21 U.S.C. section 360bbb-3(b)(1), unless the authorization is terminated or revoked.     Resp Syncytial Virus by PCR NEGATIVE NEGATIVE Final    Comment: (NOTE) Fact Sheet for Patients: bloggercourse.com  Fact Sheet for Healthcare Providers: seriousbroker.it  This test is not yet approved or cleared by the [place]  FDA and has been authorized for detection and/or diagnosis of SARS-CoV-2 by FDA under an Emergency Use Authorization (EUA). This EUA will remain in effect (meaning this test can be used) for the duration of the COVID-19 declaration under Section 564(b)(1) of the Act, 21 U.S.C. section 360bbb-3(b)(1), unless the authorization is terminated or revoked.  Performed at [facility], [address] [place], [place] [address]   Blood Culture (routine x 2)     Status: None   Collection Time: 08/13/24  4:11 AM   Specimen: BLOOD  Result Value Ref Range Status   Specimen Description BLOOD BLOOD LEFT FOREARM  Final   Special Requests   Final    BOTTLES DRAWN  AEROBIC AND ANAEROBIC Blood Culture adequate volume   Culture   Final    NO GROWTH 5 DAYS Performed at [facility], [address], [place], [place] [address]    Report Status 08/18/2024 FINAL  Final  Blood Culture (routine x 2)     Status: None   Collection Time: 08/13/24  4:11 AM   Specimen: BLOOD  Result Value Ref Range Status   Specimen Description BLOOD LEFT ANTECUBITAL  Final   Special Requests   Final    BOTTLES DRAWN AEROBIC AND ANAEROBIC Blood Culture adequate volume   Culture   Final    NO GROWTH 5 DAYS Performed at [facility], [address] [place], [place] [address]    Report Status 08/18/2024 FINAL  Final  Urine Culture     Status: Abnormal   Collection Time: 08/13/24  4:11 AM   Specimen: Urine, Random  Result Value Ref Range Status   Specimen Description   Final    URINE, RANDOM Performed at [facility], [address], [place], [place] [address]    Special Requests   Final    URINE, CLEAN CATCH Performed at [facility], [address] [place], [place] [address]    Culture >=100,000 COLONIES/mL ESCHERICHIA COLI (A)  Final   Report Status 08/15/2024 FINAL  Final   Organism ID, Bacteria ESCHERICHIA COLI (A)  Final      Susceptibility   Escherichia coli - MIC*    AMPICILLIN <=2 SENSITIVE Sensitive     CEFAZOLIN (URINE) Value in next row Sensitive      4 SENSITIVEThis is a modified FDA-approved test that has been validated and its performance characteristics determined by the reporting laboratory.  This laboratory is certified under the Clinical Laboratory Improvement Amendments CLIA as qualified to perform high complexity clinical laboratory testing.    CEFEPIME  Value in next row Sensitive      4 SENSITIVEThis is a modified FDA-approved test that has been validated and its performance characteristics determined by the reporting laboratory.  This laboratory is certified under the Clinical Laboratory Improvement Amendments  CLIA as qualified to perform high  complexity clinical laboratory testing.    ERTAPENEM Value in next row Sensitive      4 SENSITIVEThis is a modified FDA-approved test that has been validated and its performance characteristics determined by the reporting laboratory.  This laboratory is certified under the Clinical Laboratory Improvement Amendments CLIA as qualified to perform high complexity clinical laboratory testing.    CEFTRIAXONE  Value in next row Sensitive      4 SENSITIVEThis is a modified FDA-approved test that has been validated and its performance characteristics determined by the reporting laboratory.  This laboratory is certified under the Clinical Laboratory Improvement Amendments CLIA as qualified to perform high complexity clinical laboratory testing.    CIPROFLOXACIN Value in next row Sensitive      4 SENSITIVEThis is a modified FDA-approved test that has been validated and its performance characteristics determined by the reporting laboratory.  This laboratory is certified under the Clinical Laboratory Improvement Amendments CLIA as qualified to perform high complexity clinical laboratory testing.    GENTAMICIN Value in next row Sensitive      4 SENSITIVEThis is a modified FDA-approved test that has been validated and its performance characteristics determined by the reporting laboratory.  This laboratory is certified under the Clinical Laboratory Improvement Amendments CLIA as qualified to perform high complexity clinical laboratory testing.    NITROFURANTOIN Value in next row Sensitive      4 SENSITIVEThis is a modified FDA-approved test that has been validated and its performance characteristics determined by the reporting laboratory.  This laboratory is certified under the Clinical Laboratory Improvement Amendments CLIA as qualified to perform high complexity clinical laboratory testing.    TRIMETH/SULFA Value in next row Resistant      4 SENSITIVEThis is a modified FDA-approved  test that has been validated and its performance characteristics determined by the reporting laboratory.  This laboratory is certified under the Clinical Laboratory Improvement Amendments CLIA as qualified to perform high complexity clinical laboratory testing.    AMPICILLIN/SULBACTAM Value in next row Sensitive      4 SENSITIVEThis is a modified FDA-approved test that has been validated and its performance characteristics determined by the reporting laboratory.  This laboratory is certified under the Clinical Laboratory Improvement Amendments CLIA as qualified to perform high complexity clinical laboratory testing.    PIP/TAZO Value in next row Sensitive      <=4 SENSITIVEThis is a modified FDA-approved test that has been validated and its performance characteristics determined by the reporting laboratory.  This laboratory is certified under the Clinical Laboratory Improvement Amendments CLIA as qualified to perform high complexity clinical laboratory testing.    MEROPENEM Value in next row Sensitive      <=4 SENSITIVEThis is a modified FDA-approved test that has been validated and its performance characteristics determined by the reporting laboratory.  This laboratory is certified under the Clinical Laboratory Improvement Amendments CLIA as qualified to perform high complexity clinical laboratory testing.    * >=100,000 COLONIES/mL ESCHERICHIA COLI  MRSA Next Gen by PCR, Nasal     Status: None   Collection Time: 08/13/24  6:30 AM   Specimen: Nasal Mucosa; Nasal Swab  Result Value Ref Range Status   MRSA by PCR Next Gen NOT DETECTED NOT DETECTED Final    Comment: (NOTE) The GeneXpert MRSA Assay (FDA approved for NASAL specimens only), is one component of a comprehensive MRSA colonization surveillance program. It is not intended to diagnose MRSA infection nor to guide or monitor treatment for MRSA infections. Test performance is not  FDA approved in patients less than 51 years old. Performed at  [facility], [address] [place], [place] [address]   Respiratory (~20 pathogens) panel by PCR     Status: None   Collection Time: 08/13/24  6:43 AM   Specimen: Nasopharyngeal Swab; Respiratory  Result Value Ref Range Status   Adenovirus NOT DETECTED NOT DETECTED Final   Coronavirus 229E NOT DETECTED NOT DETECTED Final    Comment: (NOTE) The Coronavirus on the Respiratory Panel, DOES NOT test for the novel  Coronavirus (2019 nCoV)    Coronavirus HKU1 NOT DETECTED NOT DETECTED Final   Coronavirus NL63 NOT DETECTED NOT DETECTED Final   Coronavirus OC43 NOT DETECTED NOT DETECTED Final   Metapneumovirus NOT DETECTED NOT DETECTED Final   Rhinovirus / Enterovirus NOT DETECTED NOT DETECTED Final   Influenza A NOT DETECTED NOT DETECTED Final   Influenza B NOT DETECTED NOT DETECTED Final   Parainfluenza Virus 1 NOT DETECTED NOT DETECTED Final   Parainfluenza Virus 2 NOT DETECTED NOT DETECTED Final   Parainfluenza Virus 3 NOT DETECTED NOT DETECTED Final   Parainfluenza Virus 4 NOT DETECTED NOT DETECTED Final   Respiratory Syncytial Virus NOT DETECTED NOT DETECTED Final   Bordetella pertussis NOT DETECTED NOT DETECTED Final   Bordetella Parapertussis NOT DETECTED NOT DETECTED Final   Chlamydophila pneumoniae NOT DETECTED NOT DETECTED Final   Mycoplasma pneumoniae NOT DETECTED NOT DETECTED Final    Comment: Performed at [facility], [address], [place], [place] [address]  Culture, Respiratory w Gram Stain     Status: None   Collection Time: 08/13/24  3:54 PM   Specimen: Tracheal Aspirate; Respiratory  Result Value Ref Range Status   Specimen Description   Final    TRACHEAL ASPIRATE Performed at [facility], [address] [place], [place] [address]    Special Requests   Final    NONE Performed at [facility], [address] [place], [place] [address]    Gram Stain   Final    FEW WBC PRESENT, PREDOMINANTLY PMN FEW GRAM POSITIVE COCCI     Culture   Final    FEW Normal respiratory flora-no Staph aureus or Pseudomonas seen Performed at [facility], [address] [place], [place] [address]    Report Status 08/16/2024 FINAL  Final     Radiology Studies: No results found.  Scheduled Meds:  B-complex with vitamin C  1 tablet Oral Daily   calcium  carbonate  1 tablet Oral TID WC   carvedilol   12.5 mg Oral BID WC   dapsone   100 mg Oral QHS   darunavir -cobicistat   1 tablet Oral Q supper   feeding supplement  237 mL Oral BID BM   fluconazole   100 mg Oral Daily   heparin   5,000 Units Subcutaneous Q8H   insulin  aspart  0-15 Units Subcutaneous Q4H   ipratropium-albuterol   3 mL Nebulization BID   isosorbide -hydrALAZINE   1 tablet Oral TID   mirtazapine   15 mg Oral QHS   multivitamin with minerals  1 tablet Oral Daily   polyethylene glycol  17 g Oral Daily   rilpivirine   25 mg Oral Q supper   senna  1 tablet Oral BID   thiamine   100 mg Oral Daily   Continuous Infusions:   LOS: 6 days   Norval Bar, MD  [facility]  08/19/2024, 11:10 AM   "

## 2024-08-19 NOTE — Progress Notes (Signed)
 Mobility Specialist - Progress Note On Room Air  Pre-mobility: HR-93,  SpO2-96%  Post-mobility: HR-100 , SPO2-98   08/19/24 1400  Mobility  Activity Ambulated with assistance  Level of Assistance Standby assist, set-up cues, supervision of patient - no hands on  Assistive Device None  Distance Ambulated (ft) 200 ft  Range of Motion/Exercises All extremities  Activity Response Tolerated well  Mobility visit 1 Mobility  Mobility Specialist Start Time (ACUTE ONLY) 1415  Mobility Specialist Stop Time (ACUTE ONLY) 1429  Mobility Specialist Time Calculation (min) (ACUTE ONLY) 14 min   Pt was at the EOB on O2 @ 2 L upon entry. Pt agreed to mobility. Pt is able today to STS independently with no AD. Pt ambulated well throughout activity. Pt vitals were taken throughout activity. Pt vitals remained WNL. After activity pt returned tot he room back in bed with needs in reach.  Clem Rodes Mobility Specialist 08/19/24, 3:08 PM

## 2024-08-20 ENCOUNTER — Other Ambulatory Visit: Payer: Self-pay

## 2024-08-20 DIAGNOSIS — J9621 Acute and chronic respiratory failure with hypoxia: Secondary | ICD-10-CM | POA: Diagnosis not present

## 2024-08-20 DIAGNOSIS — N179 Acute kidney failure, unspecified: Secondary | ICD-10-CM

## 2024-08-20 DIAGNOSIS — I5023 Acute on chronic systolic (congestive) heart failure: Secondary | ICD-10-CM | POA: Diagnosis not present

## 2024-08-20 DIAGNOSIS — J9622 Acute and chronic respiratory failure with hypercapnia: Secondary | ICD-10-CM | POA: Diagnosis not present

## 2024-08-20 LAB — BASIC METABOLIC PANEL WITH GFR
Anion gap: 10 (ref 5–15)
BUN: 34 mg/dL — ABNORMAL HIGH (ref 6–20)
CO2: 22 mmol/L (ref 22–32)
Calcium: 8.5 mg/dL — ABNORMAL LOW (ref 8.9–10.3)
Chloride: 108 mmol/L (ref 98–111)
Creatinine, Ser: 2.65 mg/dL — ABNORMAL HIGH (ref 0.44–1.00)
GFR, Estimated: 20 mL/min — ABNORMAL LOW
Glucose, Bld: 100 mg/dL — ABNORMAL HIGH (ref 70–99)
Potassium: 4.6 mmol/L (ref 3.5–5.1)
Sodium: 141 mmol/L (ref 135–145)

## 2024-08-20 LAB — GLUCOSE, CAPILLARY
Glucose-Capillary: 103 mg/dL — ABNORMAL HIGH (ref 70–99)
Glucose-Capillary: 123 mg/dL — ABNORMAL HIGH (ref 70–99)
Glucose-Capillary: 103 mg/dL — ABNORMAL HIGH (ref 70–99)
Glucose-Capillary: 80 mg/dL (ref 70–99)

## 2024-08-20 MED ORDER — ACETAMINOPHEN 325 MG PO TABS
650.0000 mg | ORAL_TABLET | Freq: Four times a day (QID) | ORAL | Status: DC | PRN
  Administered 2024-08-20: 650 mg via ORAL
  Filled 2024-08-20: qty 2

## 2024-08-20 NOTE — Discharge Summary (Signed)
 " [facility] Phys[facility] Discharge Summary   Patient name: Claire Day  Admit date:     08/13/2024  Discharge date: 08/21/2024  Attending Physician: PARRIS MANNA [[id number]]  Discharge Physician: Norval Bar   PCP: Meade Bigness, MD  Admitted From: Home  Disposition:  Home  Recommendations for Outpatient Follow-up:  Follow up with PCP in 1-2 weeks Follow up with Infectious disease HIV clinic as scheduled Follow up with cardiology clinic within 2 weeks  Home Health:Yes Equipment/Devices: @ECDMELIST @  Discharge Condition:Stable CODE STATUS:FULL Diet recommendation: Regular Fluid Restriction: None  Hospital Summary: 59 year old female with PMH significant for HIV (low CD4), COPD (not on home O?), active tobacco use, NICM with HFrEF s/p AICD, HTN, CKD III, cerebral aneurysm, prior TIA, recent Influenza A and COVID-19 infection presenting to the ED with complaints of acute respiratory distress.   On chart review patient with recent admission (07/25/24-07/27/24) for acute respiratory failure secondary to AECOPD, Influenza A, and COVID-19, treated with steroids, antivirals, antibiotics, and BiPAP, discharged on room air. She re-presented today via EMS with severe shortness of breath. On EMS arrival, she was tripoding and speaking in short sentences and was placed on BiPAP, receiving IV magnesium  and Solu-Medrol  125 mg en route. Patient treated DuoNeb, LR 500 mL bolus, antibiotics (cefepime  2 g IV, vanco 1 g IV, doxy 100  mg IV). Propofol  infusion for sedation. Intubated for mechanical ventilation. Admitted to ICU.    Patient treated with IV antibiotics, mechanical ventilation and vasopressors. Extubated and weaned off pressors and transferred to medicine floor.   Acute Hypoxic Hypercapnic Respiratory Failure AECOPD CAP Resolved. Status post mechanical ventilation. Extubated 1/20. Weaned off to room air, not on oxygen at home - Nicotine  replacement therapy - encourage cessation  from tobacco use - encourage ambulation   Suspected Pneumonia  CXR: hyperinflation with subtle RLL patchy airspace disease Initial interventions/workup included: 2 L IV fluids, cefepime , vancomycin , doxycycline . Covid/influenza/RSV negative. Resp viral panel negative. MRSA screen negative. Strep urine Ag negative. Resp Cx with normal flora. Bcx 1/19 with no growth.  - finished doxycycline  5 day course on 1/23  AKI on CKD stage III Bilateral nephrolithiasis Cr 2.37 < 4.02, baseline Cr 1-1.3. Etiology unclear, likely in the setting of severe sepsis/septic shock requiring vasopressors. FeNa 11.7% concerning for intrinsic renal disease. US  renal showed medical renal disease, bilateral nephrolithiasis (1.2cm R mid pole and 0.7cm L lower pole), mild fullness of R intrarenal collecting system. Status post gentle IV fluids and D5W earlier.  - Avoid nephrotoxic agents - Follow renal function in one week - consider nephrology consult if continues to worsen   NICM/HFrEF status post AICD HTN HLD Last Echo 01/2023 recovered EF >65%. Euvolemic on exam. BNP: 5944.Chest XR (1/18): No pulmonary edema. 2Decho 1/19 showed LVEF 25-30%, global hypokinesis, mild LVH, grade 1 diastolic dysfunction. - cont BiDil  and coreg  - further GDMT restricted due to AKI and low BP - Hold off on diuresis given AKI and low BP - monitor fluid status - follow up with Cardiology outpatient    Oral Candidiasis White plaques noted on hard palate which patient reports cause pain when eating and drinking without associated odynyphagia or dysphagia. Etiology unclear, concern for oral candidiasis - cont fluconazole  for 14 day course total   HIV/AIDS PCP Pneumonia CD4 count 73, CD4 % 7.3. Per Last ID notes on prezcobix  and rilpavirine   - cont home darunavir -cobicistat  and rilpivirine  - cont home dapsone  - ID follow up as outpatient   Hypoglycemia Severe malnutrition Poor oral  intake Noted to have Hyperglycemia earlier during  MICU stay secondary to stress steroids with no hx of DM. HgbA1c 5.6 - encourage oral intake/hydration - cont home mirtazapine  - dietary supplements  - Follow up with PCP within one week    Hypernatremia Resolved. In the setting of dehydration from poor oral intake. Remained asymptomatic. Na 142 < 149. Status post D5W fluids. - encourage oral intake/hydration - monitor BMP as needed  Discharge Diagnoses:  Principal Problem:   Acute on chronic respiratory failure with hypoxia and hypercapnia (HCC) Active Problems:   HIV infection (HCC)   Primary hypertension   Protein-calorie malnutrition, severe   Dilated cardiomyopathy (HCC)   ICD (implantable cardioverter-defibrillator) in place   Acute on chronic HFrEF (heart failure with reduced ejection fraction) [facility])   Discharge Instructions  Discharge Instructions     Ambulatory referral to Cardiology   Complete by: As directed    Increase activity slowly   Complete by: As directed       Allergies as of 08/21/2024       Reactions   Sulfonamide Derivatives Hives, Dermatitis   Tramadol Other (See Comments)   Upset stomach and it gave her the shakes         Medication List     TAKE these medications    albuterol  108 (90 Base) MCG/ACT inhaler Commonly known as: VENTOLIN  HFA Inhale 2 puffs into the lungs every 4 (four) hours as needed for wheezing or shortness of breath.   B-complex with vitamin C tablet Take 1 tablet by mouth daily.   calcium  carbonate 500 MG chewable tablet Commonly known as: TUMS - dosed in mg elemental calcium  Chew 1 tablet (200 mg of elemental calcium  total) by mouth 3 (three) times daily with meals.   carvedilol  6.25 MG tablet Commonly known as: COREG  Take 1 tablet (6.25 mg total) by mouth 2 (two) times daily with a meal.   dapsone  100 MG tablet Take 100 mg by mouth at bedtime.   darunavir -cobicistat  800-150 MG tablet Commonly known as: PREZCOBIX  Take 1 tablet by mouth at bedtime. Swallow  whole. Do NOT crush, break or chew tablets. Take with food.   feeding supplement Liqd Take 237 mLs by mouth 2 (two) times daily between meals.   fluconazole  100 MG tablet Commonly known as: DIFLUCAN  Take 1 tablet (100 mg total) by mouth daily for 9 days. Start taking on: August 22, 2024   ipratropium-albuterol  0.5-2.5 (3) MG/3ML Soln Commonly known as: DUONEB Take 3 mLs by nebulization every 6 (six) hours as needed (Shortness of breath and wheezing).   isosorbide -hydrALAZINE  20-37.5 MG tablet Commonly known as: BIDIL  Take 1 tablet by mouth 3 (three) times daily.   menthol  3 MG lozenge Commonly known as: CEPACOL Take 1 lozenge (3 mg total) by mouth as needed for sore throat.   mirtazapine  15 MG tablet Commonly known as: REMERON  Take 15 mg by mouth at bedtime.   multivitamin with minerals Tabs tablet Take 1 tablet by mouth daily.   rilpivirine  25 MG Tabs tablet Commonly known as: EDURANT  Take 25 mg by mouth at bedtime.   senna 8.6 MG Tabs tablet Commonly known as: SENOKOT Take 1 tablet (8.6 mg total) by mouth daily as needed for mild constipation.   Stiolto Respimat 2.5-2.5 MCG/ACT Aers Generic drug: Tiotropium Bromide-Olodaterol Inhale 2 puffs into the lungs daily as needed (wheezing, shortness of breath).               Durable Medical Equipment  (From admission,  onward)           Start     Ordered   08/20/24 1340  For home use only DME Bedside commode  Once       Question:  Patient needs a bedside commode to treat with the following condition  Answer:  Weakness   08/20/24 1339   08/20/24 1340  For home use only DME Shower stool  Once        08/20/24 1339   08/20/24 1340  For home use only DME Walker rolling  Once       Question Answer Comment  Walker: With 5 Inch Wheels   Patient needs a walker to treat with the following condition Weakness      08/20/24 1339   08/20/24 1340  For home use only DME Walker rolling  Once       Question Answer Comment   Walker: With 5 Inch Wheels   Patient needs a walker to treat with the following condition Weakness      08/20/24 1339   08/20/24 1340  For home use only DME Shower stool  Once        08/20/24 1339   08/20/24 1339  For home use only DME Bedside commode  Once       Question:  Patient needs a bedside commode to treat with the following condition  Answer:  Weakness   08/20/24 1339            Contact information for follow-up providers     Gladis Fallow, MD. Go on 08/28/2024.   Specialty: Family Medicine Why: Go at 9:20am. Contact information: [address] [place] [address] [phone]              Contact information for after-discharge care     Home Medical Care     [facility] -  [facility]) .   Service: Home Health Services Contact information: [address] [place]  [address] [phone]                    Allergies[1]  Discharge Exam: Vitals:   08/21/24 0400 08/21/24 0845  BP: 113/77 124/85  Pulse: 87 (!) 101  Resp: 17 16  Temp: 98.6 F (37 C) 98 F (36.7 C)  SpO2: 98% 100%    Physical Exam Vitals and nursing note reviewed.  Constitutional:      General: She is not in acute distress.    Appearance: She is ill-appearing (chronically).     Comments: Weak, frail, cachectic  HENT:     Head: Normocephalic and atraumatic.  Cardiovascular:     Rate and Rhythm: Normal rate and regular rhythm.     Pulses: Normal pulses.     Heart sounds: Normal heart sounds.  Pulmonary:     Effort: Pulmonary effort is normal.     Breath sounds: Normal breath sounds.  Abdominal:     General: Bowel sounds are normal.     Palpations: Abdomen is soft.  Neurological:     Mental Status: She is alert.     The results of significant diagnostics from this hospitalization (including imaging, microbiology, ancillary and laboratory) are listed below for reference.    Microbiology: Recent Results (from the past 240  hours)  Resp panel by RT-PCR (RSV, Flu A&B, Covid) Anterior Nasal Swab     Status: None   Collection Time: 08/13/24  4:11 AM   Specimen: Anterior Nasal Swab  Result Value  Ref Range Status   SARS Coronavirus 2 by RT PCR NEGATIVE NEGATIVE Final    Comment: (NOTE) SARS-CoV-2 target nucleic acids are NOT DETECTED.  The SARS-CoV-2 RNA is generally detectable in upper respiratory specimens during the acute phase of infection. The lowest concentration of SARS-CoV-2 viral copies this assay can detect is 138 copies/mL. A negative result does not preclude SARS-Cov-2 infection and should not be used as the sole basis for treatment or other patient management decisions. A negative result may occur with  improper specimen collection/handling, submission of specimen other than nasopharyngeal swab, presence of viral mutation(s) within the areas targeted by this assay, and inadequate number of viral copies(<138 copies/mL). A negative result must be combined with clinical observations, patient history, and epidemiological information. The expected result is Negative.  Fact Sheet for Patients:  bloggercourse.com  Fact Sheet for Healthcare Providers:  seriousbroker.it  This test is no t yet approved or cleared by the [place]  FDA and  has been authorized for detection and/or diagnosis of SARS-CoV-2 by FDA under an Emergency Use Authorization (EUA). This EUA will remain  in effect (meaning this test can be used) for the duration of the COVID-19 declaration under Section 564(b)(1) of the Act, 21 U.S.C.section 360bbb-3(b)(1), unless the authorization is terminated  or revoked sooner.       Influenza A by PCR NEGATIVE NEGATIVE Final   Influenza B by PCR NEGATIVE NEGATIVE Final    Comment: (NOTE) The Xpert Xpress SARS-CoV-2/FLU/RSV plus assay is intended as an aid in the diagnosis of influenza from Nasopharyngeal swab specimens and should not  be used as a sole basis for treatment. Nasal washings and aspirates are unacceptable for Xpert Xpress SARS-CoV-2/FLU/RSV testing.  Fact Sheet for Patients: bloggercourse.com  Fact Sheet for Healthcare Providers: seriousbroker.it  This test is not yet approved or cleared by the [place]  FDA and has been authorized for detection and/or diagnosis of SARS-CoV-2 by FDA under an Emergency Use Authorization (EUA). This EUA will remain in effect (meaning this test can be used) for the duration of the COVID-19 declaration under Section 564(b)(1) of the Act, 21 U.S.C. section 360bbb-3(b)(1), unless the authorization is terminated or revoked.     Resp Syncytial Virus by PCR NEGATIVE NEGATIVE Final    Comment: (NOTE) Fact Sheet for Patients: bloggercourse.com  Fact Sheet for Healthcare Providers: seriousbroker.it  This test is not yet approved or cleared by the [place]  FDA and has been authorized for detection and/or diagnosis of SARS-CoV-2 by FDA under an Emergency Use Authorization (EUA). This EUA will remain in effect (meaning this test can be used) for the duration of the COVID-19 declaration under Section 564(b)(1) of the Act, 21 U.S.C. section 360bbb-3(b)(1), unless the authorization is terminated or revoked.  Performed at [facility], [address] [place], [place] [address]   Blood Culture (routine x 2)     Status: None   Collection Time: 08/13/24  4:11 AM   Specimen: BLOOD  Result Value Ref Range Status   Specimen Description BLOOD BLOOD LEFT FOREARM  Final   Special Requests   Final    BOTTLES DRAWN AEROBIC AND ANAEROBIC Blood Culture adequate volume   Culture   Final    NO GROWTH 5 DAYS Performed at [facility], [address] [place], [place] [address]    Report Status 08/18/2024 FINAL  Final  Blood Culture (routine x 2)     Status:  None   Collection Time: 08/13/24  4:11 AM  Specimen: BLOOD  Result Value Ref Range Status   Specimen Description BLOOD LEFT ANTECUBITAL  Final   Special Requests   Final    BOTTLES DRAWN AEROBIC AND ANAEROBIC Blood Culture adequate volume   Culture   Final    NO GROWTH 5 DAYS Performed at [facility], [address] [place], [place] [address]    Report Status 08/18/2024 FINAL  Final  Urine Culture     Status: Abnormal   Collection Time: 08/13/24  4:11 AM   Specimen: Urine, Random  Result Value Ref Range Status   Specimen Description   Final    URINE, RANDOM Performed at [facility], [address] [place], [place] [address]    Special Requests   Final    URINE, CLEAN CATCH Performed at [facility], [address] [place], [place] [address]    Culture >=100,000 COLONIES/mL ESCHERICHIA COLI (A)  Final   Report Status 08/15/2024 FINAL  Final   Organism ID, Bacteria ESCHERICHIA COLI (A)  Final      Susceptibility   Escherichia coli - MIC*    AMPICILLIN <=2 SENSITIVE Sensitive     CEFAZOLIN (URINE) Value in next row Sensitive      4 SENSITIVEThis is a modified FDA-approved test that has been validated and its performance characteristics determined by the reporting laboratory.  This laboratory is certified under the Clinical Laboratory Improvement Amendments CLIA as qualified to perform high complexity clinical laboratory testing.    CEFEPIME  Value in next row Sensitive      4 SENSITIVEThis is a modified FDA-approved test that has been validated and its performance characteristics determined by the reporting laboratory.  This laboratory is certified under the Clinical Laboratory Improvement Amendments CLIA as qualified to perform high complexity clinical laboratory testing.    ERTAPENEM Value in next row Sensitive      4 SENSITIVEThis is a modified FDA-approved test that has been validated and its performance characteristics determined by the reporting  laboratory.  This laboratory is certified under the Clinical Laboratory Improvement Amendments CLIA as qualified to perform high complexity clinical laboratory testing.    CEFTRIAXONE  Value in next row Sensitive      4 SENSITIVEThis is a modified FDA-approved test that has been validated and its performance characteristics determined by the reporting laboratory.  This laboratory is certified under the Clinical Laboratory Improvement Amendments CLIA as qualified to perform high complexity clinical laboratory testing.    CIPROFLOXACIN Value in next row Sensitive      4 SENSITIVEThis is a modified FDA-approved test that has been validated and its performance characteristics determined by the reporting laboratory.  This laboratory is certified under the Clinical Laboratory Improvement Amendments CLIA as qualified to perform high complexity clinical laboratory testing.    GENTAMICIN Value in next row Sensitive      4 SENSITIVEThis is a modified FDA-approved test that has been validated and its performance characteristics determined by the reporting laboratory.  This laboratory is certified under the Clinical Laboratory Improvement Amendments CLIA as qualified to perform high complexity clinical laboratory testing.    NITROFURANTOIN Value in next row Sensitive      4 SENSITIVEThis is a modified FDA-approved test that has been validated and its performance characteristics determined by the reporting laboratory.  This laboratory is certified under the Clinical Laboratory Improvement Amendments CLIA as qualified to perform high complexity clinical laboratory testing.    TRIMETH/SULFA Value in next row Resistant      4  SENSITIVEThis is a modified FDA-approved test that has been validated and its performance characteristics determined by the reporting laboratory.  This laboratory is certified under the Clinical Laboratory Improvement Amendments CLIA as qualified to perform high complexity clinical laboratory testing.     AMPICILLIN/SULBACTAM Value in next row Sensitive      4 SENSITIVEThis is a modified FDA-approved test that has been validated and its performance characteristics determined by the reporting laboratory.  This laboratory is certified under the Clinical Laboratory Improvement Amendments CLIA as qualified to perform high complexity clinical laboratory testing.    PIP/TAZO Value in next row Sensitive      <=4 SENSITIVEThis is a modified FDA-approved test that has been validated and its performance characteristics determined by the reporting laboratory.  This laboratory is certified under the Clinical Laboratory Improvement Amendments CLIA as qualified to perform high complexity clinical laboratory testing.    MEROPENEM Value in next row Sensitive      <=4 SENSITIVEThis is a modified FDA-approved test that has been validated and its performance characteristics determined by the reporting laboratory.  This laboratory is certified under the Clinical Laboratory Improvement Amendments CLIA as qualified to perform high complexity clinical laboratory testing.    * >=100,000 COLONIES/mL ESCHERICHIA COLI  MRSA Next Gen by PCR, Nasal     Status: None   Collection Time: 08/13/24  6:30 AM   Specimen: Nasal Mucosa; Nasal Swab  Result Value Ref Range Status   MRSA by PCR Next Gen NOT DETECTED NOT DETECTED Final    Comment: (NOTE) The GeneXpert MRSA Assay (FDA approved for NASAL specimens only), is one component of a comprehensive MRSA colonization surveillance program. It is not intended to diagnose MRSA infection nor to guide or monitor treatment for MRSA infections. Test performance is not FDA approved in patients less than 49 years old. Performed at [facility], [address] [place], [place] [address]   Respiratory (~20 pathogens) panel by PCR     Status: None   Collection Time: 08/13/24  6:43 AM   Specimen: Nasopharyngeal Swab; Respiratory  Result Value Ref Range Status   Adenovirus NOT  DETECTED NOT DETECTED Final   Coronavirus 229E NOT DETECTED NOT DETECTED Final    Comment: (NOTE) The Coronavirus on the Respiratory Panel, DOES NOT test for the novel  Coronavirus (2019 nCoV)    Coronavirus HKU1 NOT DETECTED NOT DETECTED Final   Coronavirus NL63 NOT DETECTED NOT DETECTED Final   Coronavirus OC43 NOT DETECTED NOT DETECTED Final   Metapneumovirus NOT DETECTED NOT DETECTED Final   Rhinovirus / Enterovirus NOT DETECTED NOT DETECTED Final   Influenza A NOT DETECTED NOT DETECTED Final   Influenza B NOT DETECTED NOT DETECTED Final   Parainfluenza Virus 1 NOT DETECTED NOT DETECTED Final   Parainfluenza Virus 2 NOT DETECTED NOT DETECTED Final   Parainfluenza Virus 3 NOT DETECTED NOT DETECTED Final   Parainfluenza Virus 4 NOT DETECTED NOT DETECTED Final   Respiratory Syncytial Virus NOT DETECTED NOT DETECTED Final   Bordetella pertussis NOT DETECTED NOT DETECTED Final   Bordetella Parapertussis NOT DETECTED NOT DETECTED Final   Chlamydophila pneumoniae NOT DETECTED NOT DETECTED Final   Mycoplasma pneumoniae NOT DETECTED NOT DETECTED Final    Comment: Performed at [facility], [address], [place], [place] [address]  Culture, Respiratory w Gram Stain     Status: None   Collection Time: 08/13/24  3:54 PM   Specimen: Tracheal Aspirate; Respiratory  Result Value Ref Range Status  Specimen Description   Final    TRACHEAL ASPIRATE Performed at [facility], [address] [place], [place] [address]    Special Requests   Final    NONE Performed at [facility], [address] [place], [place] [address]    Gram Stain   Final    FEW WBC PRESENT, PREDOMINANTLY PMN FEW GRAM POSITIVE COCCI    Culture   Final    FEW Normal respiratory flora-no Staph aureus or Pseudomonas seen Performed at [facility], [address] [place], [place] [address]    Report Status 08/16/2024 FINAL  Final     Labs: ProBNP, BNP (last 5 results) Recent Labs     12/02/23 0907 07/25/24 1516 08/13/24 0509  PROBNP  --  3,282.0* 5,944.0*  BNP 105.0*  --   --    Basic Metabolic Panel: Recent Labs  Lab 08/15/24 0346 08/16/24 0458 08/16/24 1331 08/17/24 0622 08/18/24 0433 08/19/24 0528 08/20/24 0617 08/21/24 0544  NA 141 144   < > 149* 142 142 141 141  K 5.0 5.1   < > 5.2* 4.7 4.7 4.6 5.6*  CL 109 112*   < > 114* 109 110 108 108  CO2 21* 25   < > 25 25 25 22 26   GLUCOSE 90 80   < > 110* 102* 86 100* 98  BUN 46* 52*   < > 39* 32* 28* 34* 32*  CREATININE 3.48* 4.02*   < > 3.69* 3.01* 2.66* 2.65* 2.37*  CALCIUM  8.4* 8.7*   < > 8.4* 8.3* 8.7* 8.5* 8.8*  MG 2.3 2.5*  --  2.1  --   --   --   --   PHOS 5.8* 3.2  --  2.6  --   --   --   --    < > = values in this interval not displayed.   Liver Function Tests: No results for input(s): AST, ALT, ALKPHOS, BILITOT, PROT, ALBUMIN in the last 168 hours. No results for input(s): LIPASE, AMYLASE in the last 168 hours. No results for input(s): AMMONIA in the last 168 hours. CBC: Recent Labs  Lab 08/15/24 0346 08/16/24 0458 08/17/24 0622  WBC 9.2 7.1 5.9  HGB 11.4* 13.4 12.8  HCT 35.4* 41.3 40.1  MCV 90.8 89.2 90.1  PLT 141* 168 157   Cardiac Enzymes: No results for input(s): CKTOTAL, CKMB, CKMBINDEX, TROPONINI, TROPONINIHS in the last 168 hours. BNP: No results for input(s): BNP in the last 168 hours. CBG: Recent Labs  Lab 08/20/24 1719 08/20/24 2025 08/20/24 2324 08/21/24 0446 08/21/24 0842  GLUCAP 123* 103* 80 73 94   D-Dimer No results for input(s): DDIMER in the last 72 hours. Hgb A1c No results for input(s): HGBA1C in the last 72 hours. Lipid Profile No results for input(s): CHOL, HDL, LDLCALC, TRIG, CHOLHDL, LDLDIRECT in the last 72 hours. Thyroid function studies No results for input(s): TSH, T4TOTAL, FREET4, T3FREE, THYROIDAB in the last 72 hours.  Invalid input(s): FREET3 Anemia work up No results for  input(s): VITAMINB12, FOLATE, FERRITIN, TIBC, IRON, RETICCTPCT in the last 72 hours. Urinalysis    Component Value Date/Time   COLORURINE STRAW (A) 08/15/2024 1300   APPEARANCEUR HAZY (A) 08/15/2024 1300   APPEARANCEUR Hazy (A) 03/16/2024 1128   LABSPEC 1.006 08/15/2024 1300   PHURINE 5.0 08/15/2024 1300   GLUCOSEU NEGATIVE 08/15/2024 1300   GLUCOSEU NEG mg/dL 96/84/7988 7956   HGBUR SMALL (A) 08/15/2024 1300   BILIRUBINUR NEGATIVE 08/15/2024  1300   BILIRUBINUR Negative 03/16/2024 1128   KETONESUR NEGATIVE 08/15/2024 1300   PROTEINUR NEGATIVE 08/15/2024 1300   UROBILINOGEN 0.2 04/22/2014 2209   NITRITE NEGATIVE 08/15/2024 1300   LEUKOCYTESUR TRACE (A) 08/15/2024 1300   Sepsis Labs Recent Labs  Lab 08/15/24 0346 08/16/24 0458 08/17/24 0622  WBC 9.2 7.1 5.9    Procedures/Studies: US  RENAL Result Date: 08/15/2024 CLINICAL DATA:  Acute renal insufficiency. EXAM: RENAL / URINARY TRACT ULTRASOUND COMPLETE COMPARISON:  CT 04/05/2024 FINDINGS: Right Kidney: Renal measurements: 9.2 x 5.5 x 4.2 cm = volume: 111 mL. Mild increased cortical echogenicity. Cortical thickness is normal. Mild fullness of the right intrarenal collecting system. 1.2 cm shadowing stone over the mid pole. Trace free fluid adjacent the upper pole. Left Kidney: Renal measurements: 9.1 x 5.2 x 4.1 cm = volume: 93 mL. Mild increased cortical echogenicity. Cortical thickness is normal. No significant hydronephrosis. 7 mm stone over the lower pole. Bladder: Appears normal for degree of bladder distention. Bilateral ureteral jets visualized. Other: None. IMPRESSION: 1. Normal size kidneys with mild increased cortical echogenicity as can be seen with medical renal disease. 2. Bilateral nephrolithiasis. Mild fullness of the right intrarenal collecting system. Electronically Signed   By: Toribio Agreste M.D.   On: 08/15/2024 09:42   ECHOCARDIOGRAM COMPLETE Result Date: 08/13/2024    ECHOCARDIOGRAM REPORT   Patient  Name:   Claire Day Date of Exam: 08/13/2024 Medical Rec #:  [id number]        Height:       68.1 in Accession #:    [id number]       Weight:       81.6 lb Date of Birth:  03/29/1966        BSA:          1.398 m Patient Age:    58 years         BP:           87/49 mmHg Patient Gender: F                HR:           92 bpm. Exam Location:  [facility] Procedure: 2D Echo, 3D Echo, Color Doppler, Cardiac Doppler and Strain Analysis            (Both Spectral and Color Flow Doppler were utilized during            procedure). Indications:     CHF-acute diastolic I50.31  History:         Patient has no prior history of Echocardiogram examinations.                  COPD, Arrythmias:LBBB; Risk Factors:Hypertension.  Sonographer:     Christopher Furnace Referring Phys:  [id number] ALMARIE BAKE OUMA Diagnosing Phys: Deatrice Cage MD  Sonographer Comments: Echo performed with patient supine and on artificial respirator. Global longitudinal strain was attempted. IMPRESSIONS  1. Left ventricular ejection fraction, by estimation, is 25 to 30%. Left ventricular ejection fraction by 3D volume is 24 %. The left ventricle has severely decreased function. The left ventricle demonstrates global hypokinesis. There is mild left ventricular hypertrophy. Left ventricular diastolic parameters are consistent with Grade I diastolic dysfunction (impaired relaxation). The average left ventricular global longitudinal strain is -6.1 %. The global longitudinal strain is abnormal.  2. Right ventricular systolic function is normal. The right ventricular size is normal. There is normal pulmonary artery systolic pressure.  3. The mitral valve is normal  in structure. No evidence of mitral valve regurgitation. No evidence of mitral stenosis.  4. The aortic valve is normal in structure. Aortic valve regurgitation is moderate. Aortic valve sclerosis/calcification is present, without any evidence of aortic stenosis. FINDINGS  Left Ventricle: Left ventricular ejection  fraction, by estimation, is 25 to 30%. Left ventricular ejection fraction by 3D volume is 24 %. The left ventricle has severely decreased function. The left ventricle demonstrates global hypokinesis. The average  left ventricular global longitudinal strain is -6.1 %. Strain was performed and the global longitudinal strain is abnormal. The left ventricular internal cavity size was normal in size. There is mild left ventricular hypertrophy. Left ventricular diastolic parameters are consistent with Grade I diastolic dysfunction (impaired relaxation). Right Ventricle: The right ventricular size is normal. No increase in right ventricular wall thickness. Right ventricular systolic function is normal. There is normal pulmonary artery systolic pressure. The tricuspid regurgitant velocity is 2.71 m/s, and  with an assumed right atrial pressure of 5 mmHg, the estimated right ventricular systolic pressure is 34.4 mmHg. Left Atrium: Left atrial size was normal in size. Right Atrium: Right atrial size was normal in size. Pericardium: Trivial pericardial effusion is present. Mitral Valve: The mitral valve is normal in structure. No evidence of mitral valve regurgitation. No evidence of mitral valve stenosis. Tricuspid Valve: The tricuspid valve is normal in structure. Tricuspid valve regurgitation is mild . No evidence of tricuspid stenosis. Aortic Valve: The aortic valve is normal in structure. Aortic valve regurgitation is moderate. Aortic regurgitation PHT measures 469 msec. Aortic valve sclerosis/calcification is present, without any evidence of aortic stenosis. Aortic valve mean gradient measures 4.0 mmHg. Aortic valve peak gradient measures 6.8 mmHg. Aortic valve area, by VTI measures 2.21 cm. Pulmonic Valve: The pulmonic valve was normal in structure. Pulmonic valve regurgitation is trivial. No evidence of pulmonic stenosis. Aorta: The aortic root is normal in size and structure. Venous: The inferior vena cava was not  well visualized. IAS/Shunts: No atrial level shunt detected by color flow Doppler. Additional Comments: A device lead is visualized.  LEFT VENTRICLE PLAX 2D LVIDd:         4.70 cm         Diastology LVIDs:         4.00 cm         LV e' medial:    6.20 cm/s LV PW:         1.30 cm         LV E/e' medial:  10.1 LV IVS:        1.30 cm         LV e' lateral:   5.00 cm/s LVOT diam:     2.10 cm         LV E/e' lateral: 12.6 LV SV:         35 LV SV Index:   25              2D Longitudinal LVOT Area:     3.46 cm        Strain LV IVRT:       114 msec        2D Strain GLS   -6.1 %                                Avg:  LV Volumes (MOD)  3D Volume EF LV vol d, MOD    127.0 ml      LV 3D EF:    Left A2C:                                        ventricul LV vol d, MOD    115.0 ml                   ar A4C:                                        ejection LV vol s, MOD    96.3 ml                    fraction A2C:                                        by 3D LV vol s, MOD    85.6 ml                    volume is A4C:                                        24 %. LV SV MOD A2C:   30.7 ml LV SV MOD A4C:   115.0 ml LV SV MOD BP:    30.1 ml       3D Volume EF:                                3D EF:        24 %                                LV EDV:       189 ml                                LV ESV:       144 ml                                LV SV:        45 ml RIGHT VENTRICLE RV Basal diam:  3.60 cm RV Mid diam:    3.20 cm LEFT ATRIUM             Index        RIGHT ATRIUM          Index LA diam:        2.80 cm 2.00 cm/m   RA Area:     8.05 cm LA Vol (A2C):   43.6 ml 31.19 ml/m  RA Volume:   11.60 ml 8.30 ml/m LA Vol (A4C):   24.8 ml 17.74 ml/m LA Biplane Vol: 37.6 ml 26.90 ml/m  AORTIC VALVE AV Area (Vmax):    2.00 cm AV Area (Vmean):  1.73 cm AV Area (VTI):     2.21 cm AV Vmax:           130.00 cm/s AV Vmean:          90.633 cm/s AV VTI:            0.157 m AV Peak Grad:      6.8 mmHg AV Mean Grad:      4.0 mmHg LVOT  Vmax:         75.00 cm/s LVOT Vmean:        45.400 cm/s LVOT VTI:          0.100 m LVOT/AV VTI ratio: 0.64 AI PHT:            469 msec  AORTA Ao Root diam: 2.70 cm MITRAL VALVE               TRICUSPID VALVE MV Area (PHT): 3.79 cm    TR Peak grad:   29.4 mmHg MV Decel Time: 200 msec    TR Vmax:        271.00 cm/s MV E velocity: 62.90 cm/s MV A velocity: 87.50 cm/s  SHUNTS MV E/A ratio:  0.72        Systemic VTI:  0.10 m                            Systemic Diam: 2.10 cm Deatrice Cage MD Electronically signed by Deatrice Cage MD Signature Date/Time: 08/13/2024/4:06:48 PM    Final    DG Chest Port 1 View Result Date: 08/13/2024 CLINICAL DATA:  Central line placement. EXAM: PORTABLE CHEST 1 VIEW COMPARISON:  Radiographs 08/13/2024.  CT 05/10/2024. FINDINGS: 1218 hours. Two views submitted. New right IJ central venous catheter projects to the mid SVC level. Endotracheal tube, enteric tube and left subclavian AICD leads appear unchanged. The heart size and mediastinal contours are stable. Mild atelectasis at both lung bases without evidence of confluent airspace disease, significant pleural effusion or pneumothorax. The bones appear unchanged. IMPRESSION: New right IJ central venous catheter projects to the mid SVC level. No evidence of pneumothorax. Electronically Signed   By: Elsie Perone M.D.   On: 08/13/2024 12:48   DG Abd 1 View Result Date: 08/13/2024 EXAM: 1 VIEW XRAY OF THE ABDOMEN 08/13/2024 07:07:00 AM COMPARISON: Abdomen film today at 4:53 am, abdomen film 06/26/2024. CLINICAL HISTORY: 360298 Feeding tube dysfunction 360298 Feeding tube dysfunction FINDINGS: LINES, TUBES AND DEVICES: NGT positioning is unchanged with the tip abutting the proximal fundal wall and the side port at the EG junction. Advancement is recommended to position the side port below the hiatus. The bladder is catheterized. BOWEL: Nonobstructive bowel gas pattern. There is mild to moderate retained stool in the cecum and rectum.  Again noted 4 cm dilatation of a central abdominal post-surgical small bowel segment. No acute obstruction is suspected. SOFT TISSUES: Several bilateral kidney stones are again noted. No new pathologic calcification. Surgical changes in the right upper abdomen. BONES: No acute fracture. IMPRESSION: 1. NGT tip abutting the proximal fundal wall and side port at the EG junction; advancement is recommended to position the side port below the hiatus. Electronically signed by: Francis Quam MD 08/13/2024 07:30 AM EST RP Workstation: [id number]   DG Abdomen 1 View Result Date: 08/13/2024 CLINICAL DATA:  OG tube placement. EXAM: ABDOMEN - 1 VIEW COMPARISON:  06/26/2024 FINDINGS: OG tube tip is in the proximal stomach with side port position in the  region of the GE junction. IMPRESSION: OG tube tip is in the proximal stomach with side port position in the region of the GE junction. Tube could be advanced approximately 5 cm to ensure side port positioning below the GE junction. Repeat x-ray recommended after repositioning. Electronically Signed   By: Camellia Candle M.D.   On: 08/13/2024 05:10   DG Chest Portable 1 View Result Date: 08/13/2024 CLINICAL DATA:  Ventilator dependence.  Status post intubation. EXAM: PORTABLE CHEST 1 VIEW COMPARISON:  08/13/2024, earlier same day FINDINGS: Scratchendotracheal tube tip is approximately 4.3 cm above the base of the carina. The NG tube passes into the stomach although the distal tip position is not included on the film. Cardiopericardial silhouette is at upper limits of normal for size. Lungs are hyperexpanded patchy airspace disease seen in the right base on the previous study is less prominent on the current exam. Similar blunting of the left costophrenic angle, chronic. Telemetry leads overlie the chest. IMPRESSION: 1. Endotracheal tube tip is approximately 4.3 cm above the base of the carina. 2. Interval decrease in patchy airspace disease at the right base. Electronically  Signed   By: Camellia Candle M.D.   On: 08/13/2024 05:10   DG Chest Portable 1 View Result Date: 08/13/2024 CLINICAL DATA:  Shortness of breath. EXAM: PORTABLE CHEST 1 VIEW COMPARISON:  07/25/2024 FINDINGS: Lungs are hyperexpanded. Cardiopericardial silhouette is at upper limits of normal for size. Stable blunting of the left costophrenic angle. Subtle patchy airspace disease noted at the right lung base. No pulmonary edema or focal lung consolidation. Left-sided permanent pacemaker/AICD again noted. Telemetry leads overlie the chest. IMPRESSION: Subtle patchy airspace disease at the right lung base. Imaging features could be related to atelectasis or developing infiltrate. Electronically Signed   By: Camellia Candle M.D.   On: 08/13/2024 05:06   DG Chest 1 View Result Date: 07/25/2024 EXAM: 1 VIEW(S) XRAY OF THE CHEST 07/25/2024 02:20:08 PM COMPARISON: Chest radiograph dated 05/10/2024. CLINICAL HISTORY: Cough. FINDINGS: LUNGS AND PLEURA: Emphysema and left lung base scarring. No pleural effusion. No pneumothorax. HEART AND MEDIASTINUM: Left pectoral AICD device. No acute abnormality of the cardiac and mediastinal silhouettes. BONES AND SOFT TISSUES: No acute osseous abnormality. IMPRESSION: 1. No acute findings. Electronically signed by: Vanetta Chou MD 07/25/2024 02:44 PM EST RP Workstation: [id number]    Time coordinating discharge: 50 mins  SIGNED:  Norval Bar, MD [facility] 08/21/24, 9:57 AM     [1]  Allergies Allergen Reactions   Sulfonamide Derivatives Hives and Dermatitis   Tramadol Other (See Comments)    Upset stomach and it gave her the shakes    "

## 2024-08-20 NOTE — Progress Notes (Signed)
 " PROGRESS NOTE    Claire Day  FMW:[id number] DOB: [birth year] DOA: 08/13/2024 PCP: Meade Bigness, MD  Subjective: No acute events overnight. Seen and examined at bedside. No new complaints. Denies nausea, vomiting, constipation.    Hospital Course: 59 year old female with PMH significant for HIV (low CD4), COPD (not on home O?), active tobacco use, NICM with HFrEF s/p AICD, HTN, CKD III, cerebral aneurysm, prior TIA, recent Influenza A and COVID-19 infection presenting to the ED with complaints of acute respiratory distress.   On chart review patient with recent admission (07/25/24-07/27/24) for acute respiratory failure secondary to AECOPD, Influenza A, and COVID-19, treated with steroids, antivirals, antibiotics, and BiPAP, discharged on room air. She re-presented today via EMS with severe shortness of breath. On EMS arrival, she was tripoding and speaking in short sentences and was placed on BiPAP, receiving IV magnesium  and Solu-Medrol  125 mg en route. Patient treated DuoNeb, LR 500 mL bolus, antibiotics (cefepime  2 g IV, vanco 1 g IV, doxy 100  mg IV). Propofol  infusion for sedation. Intubated for mechanical ventilation. Admitted to ICU.    Patient treated with IV antibiotics, mechanical ventilation and vasopressors. Extubated and weaned off pressors and transferred to medicine floor.    Assessment and Plan:  AKI on CKD stage III Bilateral nephrolithiasis Cr 2.65 < 4.02, baseline Cr 1-1.3 - etiology unclear, likely in the setting of severe sepsis/septic shock requiring vasopressors - FeNa 11.7% concerning for intrinsic renal disease - US  renal showed medical renal disease, bilateral nephrolithiasis (1.2cm R mid pole and 0.7cm L lower pole), mild fullness of R intrarenal collecting system - status post gentle IV fluids and D5W earlier  - renal dose meds - Avoid nephrotoxic agents - Follow I/Os, renal function - consider nephrology consult if continues to worsen   NICM/HFrEF status  post AICD HTN HLD Last Echo 01/2023 recovered EF >65%,  -Euvolemic on exam.  -BNP: 5944 -Chest XR (1/18): No pulmonary edema -2Decho 1/19 showed LVEF 25-30%, global hypokinesis, mild LVH, grade 1 diastolic dysfunction - cont BiDil  and coreg  - plan to start ACEi/ARB and farxiga if renal function remains stable - Hold Diuretics for now - Strict I/Os - Cardiology following    Oral Candidiasis - white plaques noted on hard palate which patient reports cause pain when eating and drinking without associated odynyphagia or dysphagia - etiology unclear, concern for oral candidiasis - cont fluconazole  for 14 day course   HIV/AIDS PCP Pneumonia - CD4 count 73, CD4 % 7.3 - Per Last ID notes on prezcobix  and rilpavirine   - cont home darunavir -cobicistat  and rilpivirine  - cont home dapsone  - ID following   Hypoglycemia Severe malnutrition Poor oral intake Noted to have Hyperglycemia earlier during MICU stay secondary to stress steroids with no hx of DM - HgbA1c 5.6 - encourage oral intake/hydration - cont home mirtazapine  - dietary supplements as per dietician recs - cont SSI with FS ACHS - Follow hypo/hyperglycemic protocol    Hypernatremia - resolved - likely due to dehydration from poor oral intake - asymptomatic at present - Na 142 < 149 - stopped D5W - encourage oral intake/hydration - monitor BMP   Acute Hypoxic Hypercapnic Respiratory Failure AECOPD CAP - resolved - status post mechanical ventilation. Extubated 1/20 - weaned off to room air, not on oxygen at home - cont duonebs - hold off on steroids for now -Nicotine  replacement therapy - cont incentive spirometry - OOB to chair during daytime - encourage ambulation   Suspected Pneumonia  CXR: hyperinflation with  subtle RLL patchy airspace disease Initial interventions/workup included: 2 L of NS/LR & cefepime , vancomycin , doxycycline  - covid/influenza/RSV negative - resp viral panel negative - MRSA screen  negative - strep urine Ag negative - resp Cx with few GPCs, final read pending - Bcx 1/19 showed NGTD, final read pending - finished doxycycline  5 day course on 1/23  DVT prophylaxis: heparin  injection 5,000 Units Start: 08/13/24 0600  SQ Heparin    Code Status: Full Code  Disposition Plan: CIR Reason for continuing need for hospitalization: cardiology recommendations of GDMT, CIR placement  Objective: Vitals:   08/19/24 2121 08/20/24 0455 08/20/24 0533 08/20/24 0734  BP: 97/64 109/79  108/75  Pulse: 87 88  88  Resp: 17 18  18   Temp: 98.6 F (37 C) 98.3 F (36.8 C)  98.1 F (36.7 C)  TempSrc:    Oral  SpO2: 99% 100%  99%  Weight:   43.2 kg     Intake/Output Summary (Last 24 hours) at 08/20/2024 1135 Last data filed at 08/20/2024 0900 Gross per 24 hour  Intake 600 ml  Output --  Net 600 ml   Filed Weights   08/18/24 0500 08/19/24 0500 08/20/24 0533  Weight: 43.2 kg 43.5 kg 43.2 kg    Examination:  Physical Exam Vitals and nursing note reviewed.  Constitutional:      General: She is not in acute distress.    Appearance: She is ill-appearing (chronically).     Comments: Weak, frail, cachectic  HENT:     Head: Normocephalic and atraumatic.  Cardiovascular:     Rate and Rhythm: Normal rate and regular rhythm.     Pulses: Normal pulses.     Heart sounds: Normal heart sounds.  Pulmonary:     Effort: Pulmonary effort is normal. No respiratory distress.     Breath sounds: Normal breath sounds. No wheezing.  Abdominal:     General: Bowel sounds are normal.     Palpations: Abdomen is soft.  Neurological:     Mental Status: She is alert.     Data Reviewed: I have personally reviewed following labs and imaging studies  CBC: Recent Labs  Lab 08/14/24 0434 08/15/24 0346 08/16/24 0458 08/17/24 0622  WBC 11.7*  11.5* 9.2 7.1 5.9  NEUTROABS 8.9*  --   --   --   HGB 12.2  12.5 11.4* 13.4 12.8  HCT 38.2  37.6 35.4* 41.3 40.1  MCV 90.5  90 90.8 89.2 90.1   PLT 187  184 141* 168 157   Basic Metabolic Panel: Recent Labs  Lab 08/13/24 1714 08/13/24 1714 08/14/24 0434 08/15/24 0346 08/16/24 0458 08/16/24 1331 08/17/24 0622 08/18/24 0433 08/19/24 0528 08/20/24 0617  NA  --    < > 139 141 144 145 149* 142 142 141  K 4.7  --  4.9 5.0 5.1 5.1 5.2* 4.7 4.7 4.6  CL  --    < > 108 109 112* 111 114* 109 110 108  CO2  --    < > 20* 21* 25 22 25 25 25 22   GLUCOSE  --    < > 101* 90 80 101* 110* 102* 86 100*  BUN  --    < > 37* 46* 52* 46* 39* 32* 28* 34*  CREATININE  --    < > 2.76* 3.48* 4.02* 3.92* 3.69* 3.01* 2.66* 2.65*  CALCIUM   --    < > 7.9* 8.4* 8.7* 8.8* 8.4* 8.3* 8.7* 8.5*  MG 2.4  --  2.4 2.3  2.5*  --  2.1  --   --   --   PHOS  --   --  5.5* 5.8* 3.2  --  2.6  --   --   --    < > = values in this interval not displayed.   GFR: Estimated Creatinine Clearance: 15.8 mL/min (A) (by C-G formula based on SCr of 2.65 mg/dL (H)). Liver Function Tests: No results for input(s): AST, ALT, ALKPHOS, BILITOT, PROT, ALBUMIN in the last 168 hours. No results for input(s): LIPASE, AMYLASE in the last 168 hours. No results for input(s): AMMONIA in the last 168 hours. Coagulation Profile: No results for input(s): INR, PROTIME in the last 168 hours. Cardiac Enzymes: No results for input(s): CKTOTAL, CKMB, CKMBINDEX, TROPONINI in the last 168 hours. ProBNP, BNP (last 5 results) Recent Labs    12/02/23 0907 07/25/24 1516 08/13/24 0509  PROBNP  --  3,282.0* 5,944.0*  BNP 105.0*  --   --    HbA1C: No results for input(s): HGBA1C in the last 72 hours. CBG: Recent Labs  Lab 08/19/24 0519 08/19/24 0745 08/19/24 1201 08/19/24 1803 08/19/24 2003  GLUCAP 89 83 109* 78 104*   Lipid Profile: No results for input(s): CHOL, HDL, LDLCALC, TRIG, CHOLHDL, LDLDIRECT in the last 72 hours. Thyroid Function Tests: No results for input(s): TSH, T4TOTAL, FREET4, T3FREE, THYROIDAB in the last 72  hours. Anemia Panel: No results for input(s): VITAMINB12, FOLATE, FERRITIN, TIBC, IRON, RETICCTPCT in the last 72 hours. Sepsis Labs: Recent Labs  Lab 08/13/24 2252  LATICACIDVEN 2.7*    Recent Results (from the past 240 hours)  Resp panel by RT-PCR (RSV, Flu A&B, Covid) Anterior Nasal Swab     Status: None   Collection Time: 08/13/24  4:11 AM   Specimen: Anterior Nasal Swab  Result Value Ref Range Status   SARS Coronavirus 2 by RT PCR NEGATIVE NEGATIVE Final    Comment: (NOTE) SARS-CoV-2 target nucleic acids are NOT DETECTED.  The SARS-CoV-2 RNA is generally detectable in upper respiratory specimens during the acute phase of infection. The lowest concentration of SARS-CoV-2 viral copies this assay can detect is 138 copies/mL. A negative result does not preclude SARS-Cov-2 infection and should not be used as the sole basis for treatment or other patient management decisions. A negative result may occur with  improper specimen collection/handling, submission of specimen other than nasopharyngeal swab, presence of viral mutation(s) within the areas targeted by this assay, and inadequate number of viral copies(<138 copies/mL). A negative result must be combined with clinical observations, patient history, and epidemiological information. The expected result is Negative.  Fact Sheet for Patients:  bloggercourse.com  Fact Sheet for Healthcare Providers:  seriousbroker.it  This test is no t yet approved or cleared by the [place]  FDA and  has been authorized for detection and/or diagnosis of SARS-CoV-2 by FDA under an Emergency Use Authorization (EUA). This EUA will remain  in effect (meaning this test can be used) for the duration of the COVID-19 declaration under Section 564(b)(1) of the Act, 21 U.S.C.section 360bbb-3(b)(1), unless the authorization is terminated  or revoked sooner.       Influenza A by PCR  NEGATIVE NEGATIVE Final   Influenza B by PCR NEGATIVE NEGATIVE Final    Comment: (NOTE) The Xpert Xpress SARS-CoV-2/FLU/RSV plus assay is intended as an aid in the diagnosis of influenza from Nasopharyngeal swab specimens and should not be used as a sole basis for treatment. Nasal washings and aspirates are unacceptable for  Xpert Xpress SARS-CoV-2/FLU/RSV testing.  Fact Sheet for Patients: bloggercourse.com  Fact Sheet for Healthcare Providers: seriousbroker.it  This test is not yet approved or cleared by the [place]  FDA and has been authorized for detection and/or diagnosis of SARS-CoV-2 by FDA under an Emergency Use Authorization (EUA). This EUA will remain in effect (meaning this test can be used) for the duration of the COVID-19 declaration under Section 564(b)(1) of the Act, 21 U.S.C. section 360bbb-3(b)(1), unless the authorization is terminated or revoked.     Resp Syncytial Virus by PCR NEGATIVE NEGATIVE Final    Comment: (NOTE) Fact Sheet for Patients: bloggercourse.com  Fact Sheet for Healthcare Providers: seriousbroker.it  This test is not yet approved or cleared by the [place]  FDA and has been authorized for detection and/or diagnosis of SARS-CoV-2 by FDA under an Emergency Use Authorization (EUA). This EUA will remain in effect (meaning this test can be used) for the duration of the COVID-19 declaration under Section 564(b)(1) of the Act, 21 U.S.C. section 360bbb-3(b)(1), unless the authorization is terminated or revoked.  Performed at [facility], [address] [place], [place] [address]   Blood Culture (routine x 2)     Status: None   Collection Time: 08/13/24  4:11 AM   Specimen: BLOOD  Result Value Ref Range Status   Specimen Description BLOOD BLOOD LEFT FOREARM  Final   Special Requests   Final    BOTTLES DRAWN AEROBIC AND  ANAEROBIC Blood Culture adequate volume   Culture   Final    NO GROWTH 5 DAYS Performed at [facility], [address], [place], [place] [address]    Report Status 08/18/2024 FINAL  Final  Blood Culture (routine x 2)     Status: None   Collection Time: 08/13/24  4:11 AM   Specimen: BLOOD  Result Value Ref Range Status   Specimen Description BLOOD LEFT ANTECUBITAL  Final   Special Requests   Final    BOTTLES DRAWN AEROBIC AND ANAEROBIC Blood Culture adequate volume   Culture   Final    NO GROWTH 5 DAYS Performed at [facility], [address], [place], [place] [address]    Report Status 08/18/2024 FINAL  Final  Urine Culture     Status: Abnormal   Collection Time: 08/13/24  4:11 AM   Specimen: Urine, Random  Result Value Ref Range Status   Specimen Description   Final    URINE, RANDOM Performed at [facility], [address] [place], [place] [address]    Special Requests   Final    URINE, CLEAN CATCH Performed at [facility], [address], [place], [place] [address]    Culture >=100,000 COLONIES/mL ESCHERICHIA COLI (A)  Final   Report Status 08/15/2024 FINAL  Final   Organism ID, Bacteria ESCHERICHIA COLI (A)  Final      Susceptibility   Escherichia coli - MIC*    AMPICILLIN <=2 SENSITIVE Sensitive     CEFAZOLIN (URINE) Value in next row Sensitive      4 SENSITIVEThis is a modified FDA-approved test that has been validated and its performance characteristics determined by the reporting laboratory.  This laboratory is certified under the Clinical Laboratory Improvement Amendments CLIA as qualified to perform high complexity clinical laboratory testing.    CEFEPIME  Value in next row Sensitive      4 SENSITIVEThis is a modified FDA-approved test that has been validated and its performance characteristics determined by the reporting laboratory.  This laboratory is certified under the Clinical Laboratory Improvement Amendments CLIA as  qualified to perform high complexity clinical laboratory testing.    ERTAPENEM Value in next row Sensitive      4 SENSITIVEThis is a modified FDA-approved test that has been validated and its performance characteristics determined by the reporting laboratory.  This laboratory is certified under the Clinical Laboratory Improvement Amendments CLIA as qualified to perform high complexity clinical laboratory testing.    CEFTRIAXONE  Value in next row Sensitive      4 SENSITIVEThis is a modified FDA-approved test that has been validated and its performance characteristics determined by the reporting laboratory.  This laboratory is certified under the Clinical Laboratory Improvement Amendments CLIA as qualified to perform high complexity clinical laboratory testing.    CIPROFLOXACIN Value in next row Sensitive      4 SENSITIVEThis is a modified FDA-approved test that has been validated and its performance characteristics determined by the reporting laboratory.  This laboratory is certified under the Clinical Laboratory Improvement Amendments CLIA as qualified to perform high complexity clinical laboratory testing.    GENTAMICIN Value in next row Sensitive      4 SENSITIVEThis is a modified FDA-approved test that has been validated and its performance characteristics determined by the reporting laboratory.  This laboratory is certified under the Clinical Laboratory Improvement Amendments CLIA as qualified to perform high complexity clinical laboratory testing.    NITROFURANTOIN Value in next row Sensitive      4 SENSITIVEThis is a modified FDA-approved test that has been validated and its performance characteristics determined by the reporting laboratory.  This laboratory is certified under the Clinical Laboratory Improvement Amendments CLIA as qualified to perform high complexity clinical laboratory testing.    TRIMETH/SULFA Value in next row Resistant      4 SENSITIVEThis is a modified FDA-approved test that  has been validated and its performance characteristics determined by the reporting laboratory.  This laboratory is certified under the Clinical Laboratory Improvement Amendments CLIA as qualified to perform high complexity clinical laboratory testing.    AMPICILLIN/SULBACTAM Value in next row Sensitive      4 SENSITIVEThis is a modified FDA-approved test that has been validated and its performance characteristics determined by the reporting laboratory.  This laboratory is certified under the Clinical Laboratory Improvement Amendments CLIA as qualified to perform high complexity clinical laboratory testing.    PIP/TAZO Value in next row Sensitive      <=4 SENSITIVEThis is a modified FDA-approved test that has been validated and its performance characteristics determined by the reporting laboratory.  This laboratory is certified under the Clinical Laboratory Improvement Amendments CLIA as qualified to perform high complexity clinical laboratory testing.    MEROPENEM Value in next row Sensitive      <=4 SENSITIVEThis is a modified FDA-approved test that has been validated and its performance characteristics determined by the reporting laboratory.  This laboratory is certified under the Clinical Laboratory Improvement Amendments CLIA as qualified to perform high complexity clinical laboratory testing.    * >=100,000 COLONIES/mL ESCHERICHIA COLI  MRSA Next Gen by PCR, Nasal     Status: None   Collection Time: 08/13/24  6:30 AM   Specimen: Nasal Mucosa; Nasal Swab  Result Value Ref Range Status   MRSA by PCR Next Gen NOT DETECTED NOT DETECTED Final    Comment: (NOTE) The GeneXpert MRSA Assay (FDA approved for NASAL specimens only), is one component of a comprehensive MRSA colonization surveillance program. It is not intended  to diagnose MRSA infection nor to guide or monitor treatment for MRSA infections. Test performance is not FDA approved in patients less than 54 years old. Performed at [facility], [address] [place], [place] [address]   Respiratory (~20 pathogens) panel by PCR     Status: None   Collection Time: 08/13/24  6:43 AM   Specimen: Nasopharyngeal Swab; Respiratory  Result Value Ref Range Status   Adenovirus NOT DETECTED NOT DETECTED Final   Coronavirus 229E NOT DETECTED NOT DETECTED Final    Comment: (NOTE) The Coronavirus on the Respiratory Panel, DOES NOT test for the novel  Coronavirus (2019 nCoV)    Coronavirus HKU1 NOT DETECTED NOT DETECTED Final   Coronavirus NL63 NOT DETECTED NOT DETECTED Final   Coronavirus OC43 NOT DETECTED NOT DETECTED Final   Metapneumovirus NOT DETECTED NOT DETECTED Final   Rhinovirus / Enterovirus NOT DETECTED NOT DETECTED Final   Influenza A NOT DETECTED NOT DETECTED Final   Influenza B NOT DETECTED NOT DETECTED Final   Parainfluenza Virus 1 NOT DETECTED NOT DETECTED Final   Parainfluenza Virus 2 NOT DETECTED NOT DETECTED Final   Parainfluenza Virus 3 NOT DETECTED NOT DETECTED Final   Parainfluenza Virus 4 NOT DETECTED NOT DETECTED Final   Respiratory Syncytial Virus NOT DETECTED NOT DETECTED Final   Bordetella pertussis NOT DETECTED NOT DETECTED Final   Bordetella Parapertussis NOT DETECTED NOT DETECTED Final   Chlamydophila pneumoniae NOT DETECTED NOT DETECTED Final   Mycoplasma pneumoniae NOT DETECTED NOT DETECTED Final    Comment: Performed at [facility], [address] [place], [place] [address]  Culture, Respiratory w Gram Stain     Status: None   Collection Time: 08/13/24  3:54 PM   Specimen: Tracheal Aspirate; Respiratory  Result Value Ref Range Status   Specimen Description   Final    TRACHEAL ASPIRATE Performed at [facility], [address] [place], [place] [address]    Special Requests   Final    NONE Performed at [facility], [address] [place], [place] [address]    Gram Stain   Final    FEW WBC PRESENT, PREDOMINANTLY PMN FEW GRAM POSITIVE COCCI     Culture   Final    FEW Normal respiratory flora-no Staph aureus or Pseudomonas seen Performed at [facility], [address], [place], [place] [address]    Report Status 08/16/2024 FINAL  Final     Radiology Studies: No results found.  Scheduled Meds:  B-complex with vitamin C  1 tablet Oral Daily   calcium  carbonate  1 tablet Oral TID WC   carvedilol   6.25 mg Oral BID WC   dapsone   100 mg Oral QHS   darunavir -cobicistat   1 tablet Oral Q supper   feeding supplement  237 mL Oral BID BM   fluconazole   100 mg Oral Daily   heparin   5,000 Units Subcutaneous Q8H   insulin  aspart  0-15 Units Subcutaneous Q4H   isosorbide -hydrALAZINE   1 tablet Oral TID   mirtazapine   15 mg Oral QHS   multivitamin with minerals  1 tablet Oral Daily   polyethylene glycol  17 g Oral Daily   rilpivirine   25 mg Oral Q supper   senna  1 tablet Oral BID   thiamine   100 mg Oral Daily   Continuous Infusions:   LOS: 7 days   Norval Bar, MD  [facility]  08/20/2024, 11:35 AM   "

## 2024-08-20 NOTE — Progress Notes (Signed)
 Physical Therapy Treatment Patient Details Name: Claire Day MRN: [id number] DOB: [birth year] Today's Date: 08/20/2024   History of Present Illness Pt is a 59 yo female that presented to ED for acute respiratory distress. Initially placed on bipap but intubated and admitted to ICU (extubated 1/20). PMH of HIV (low CD4), COPD (not on home O2), active tobacco use, NICM with HFrEF s/p AICD, HTN, CKD III, cerebral aneurysm, prior TIA, recent Influenza A and COVID-19 infection    PT Comments  Patient seen for PT session focused on functional mobility. Patient required supervision modI for 22' and stair navigation. and used no AD. Tolerated session well with mild signs of exertion. Vitals remained stable during activity. Main limiting factors today were aerobic endurance. Interventions aimed at improving ambulation. Patient shows great potential to make progress with continued acute level rehab. Patient continues to demonstrate mild activity restrictions and poor tolerance for long endurance activity. Continued skilled PT recommended to progress toward functional goals and support discharge readiness. Pt making good progress toward goals, will continue to follow POC. Discharge recommendation remains appropriate     If plan is discharge home, recommend the following: A little help with walking and/or transfers;A little help with bathing/dressing/bathroom;Assistance with cooking/housework;Assist for transportation;Help with stairs or ramp for entrance   Can travel by private vehicle        Equipment Recommendations  Rolling walker (2 wheels);BSC/3in1    Recommendations for Other Services       Precautions / Restrictions Precautions Precautions: Fall Recall of Precautions/Restrictions: Intact Restrictions Weight Bearing Restrictions Per Provider Order: No     Mobility  Bed Mobility                    Transfers Overall transfer level: Needs assistance Equipment used:  None Transfers: Sit to/from Stand Sit to Stand: Supervision           General transfer comment: steadying without AD    Ambulation/Gait Ambulation/Gait assistance: Supervision Gait Distance (Feet): 50 Feet Assistive device: None Gait Pattern/deviations: Decreased stride length, Decreased dorsiflexion - right, Decreased dorsiflexion - left Gait velocity: decreased     General Gait Details: No LOB, slow; mildly impaired activity tolerance   Stairs Stairs: Yes Stairs assistance: Supervision, Contact guard assist Stair Management: One rail Left, One rail Right, Alternating pattern, Step to pattern, Forwards Number of Stairs: 32     Wheelchair Mobility     Tilt Bed    Modified Rankin (Stroke Patients Only)       Balance Overall balance assessment: Independent Sitting-balance support: Feet supported, No upper extremity supported Sitting balance-Leahy Scale: Good     Standing balance support: No upper extremity supported, During functional activity Standing balance-Leahy Scale: Good                              Communication Communication Communication: No apparent difficulties  Cognition Arousal: Alert Behavior During Therapy: WFL for tasks assessed/performed                             Following commands: Intact      Cueing Cueing Techniques: Verbal cues  Exercises      General Comments        Pertinent Vitals/Pain Pain Assessment Pain Assessment: Faces Faces Pain Scale: Hurts little more Pain Location: heartburn Pain Descriptors / Indicators: Discomfort    Home Living  Prior Function            PT Goals (current goals can now be found in the care plan section) Acute Rehab PT Goals Patient Stated Goal: to go home PT Goal Formulation: With patient Time For Goal Achievement: 08/29/24 Potential to Achieve Goals: Good Progress towards PT goals: Progressing toward goals     Frequency    Min 3X/week      PT Plan      Co-evaluation              AM-PAC PT 6 Clicks Mobility   Outcome Measure  Help needed turning from your back to your side while in a flat bed without using bedrails?: None Help needed moving from lying on your back to sitting on the side of a flat bed without using bedrails?: A Little Help needed moving to and from a bed to a chair (including a wheelchair)?: A Little Help needed standing up from a chair using your arms (e.g., wheelchair or bedside chair)?: A Little Help needed to walk in hospital room?: A Little Help needed climbing 3-5 steps with a railing? : A Little 6 Click Score: 19    End of Session   Activity Tolerance: Patient tolerated treatment well Patient left: in chair;with call bell/phone within reach;with chair alarm set Nurse Communication: Mobility status PT Visit Diagnosis: Other abnormalities of gait and mobility (R26.89);Difficulty in walking, not elsewhere classified (R26.2);Muscle weakness (generalized) (M62.81)     Time: 8972-8959 PT Time Calculation (min) (ACUTE ONLY): 13 min  Charges:    $Therapeutic Activity: 8-22 mins PT General Charges $$ ACUTE PT VISIT: 1 Visit                     Claire Day DPT, PT     Claire Day 08/20/2024, 10:51 AM

## 2024-08-20 NOTE — Progress Notes (Addendum)
 Inpatient Rehab Admissions Coordinator:   Pt progressing extremely well with therapy.  Currently supervision/mod I with OT and supervision with PT for 50' and 32 stairs.  Pt no longer meets the functional/therapy requirements for CIR.  Discussed with OT and recommendations updated to [facility].  TOC aware.  Will update pt/family as well.   115: left message for daughter to update on [facility] recommendations.   Reche Lowers, PT, DPT Admissions Coordinator [phone] 08/20/24 11:42 AM

## 2024-08-20 NOTE — Progress Notes (Signed)
 "  Cardiology Progress Note   Patient Name: Claire Day Date of Encounter: 08/20/2024  Primary Cardiologist: Timothy Gollan, MD  Subjective   Feels well.  Lying flat.  No chest pain or dyspnea.  Eager to go home. Objective   Inpatient Medications    Scheduled Meds:  B-complex with vitamin C  1 tablet Oral Daily   calcium  carbonate  1 tablet Oral TID WC   carvedilol   6.25 mg Oral BID WC   dapsone   100 mg Oral QHS   darunavir -cobicistat   1 tablet Oral Q supper   feeding supplement  237 mL Oral BID BM   fluconazole   100 mg Oral Daily   heparin   5,000 Units Subcutaneous Q8H   insulin  aspart  0-15 Units Subcutaneous Q4H   isosorbide -hydrALAZINE   1 tablet Oral TID   mirtazapine   15 mg Oral QHS   multivitamin with minerals  1 tablet Oral Daily   polyethylene glycol  17 g Oral Daily   rilpivirine   25 mg Oral Q supper   senna  1 tablet Oral BID   thiamine   100 mg Oral Daily   Continuous Infusions:  PRN Meds: acetaminophen , alum & mag hydroxide-simeth, ipratropium-albuterol , menthol , ondansetron  (ZOFRAN ) IV, mouth rinse, phenol, polyethylene glycol, senna   Vital Signs    Vitals:   08/19/24 2121 08/20/24 0455 08/20/24 0533 08/20/24 0734  BP: 97/64 109/79  108/75  Pulse: 87 88  88  Resp: 17 18  18   Temp: 98.6 F (37 C) 98.3 F (36.8 C)  98.1 F (36.7 C)  TempSrc:    Oral  SpO2: 99% 100%  99%  Weight:   43.2 kg     Intake/Output Summary (Last 24 hours) at 08/20/2024 1526 Last data filed at 08/20/2024 1317 Gross per 24 hour  Intake 837 ml  Output 650 ml  Net 187 ml   Filed Weights   08/18/24 0500 08/19/24 0500 08/20/24 0533  Weight: 43.2 kg 43.5 kg 43.2 kg    Physical Exam   GEN: Well nourished, well developed, in no acute distress.  HEENT: Grossly normal.  Neck: Supple, no JVD, carotid bruits, or masses. Cardiac: RRR, no murmurs, rubs, or gallops. No clubbing, cyanosis, edema.  Radials 2+, DP/PT 2+ and equal bilaterally.  Respiratory:  Respirations regular  and unlabored, clear to auscultation bilaterally. GI: Soft, nontender, nondistended, BS + x 4. MS: no deformity or atrophy. Skin: warm and dry, no rash. Neuro:  Strength and sensation are intact. Psych: AAOx3.  Normal affect.  Labs    Chemistry Recent Labs  Lab 08/18/24 0433 08/19/24 0528 08/20/24 0617  NA 142 142 141  K 4.7 4.7 4.6  CL 109 110 108  CO2 25 25 22   GLUCOSE 102* 86 100*  BUN 32* 28* 34*  CREATININE 3.01* 2.66* 2.65*  CALCIUM  8.3* 8.7* 8.5*  GFRNONAA 17* 20* 20*  ANIONGAP 8 8 10      Hematology Recent Labs  Lab 08/15/24 0346 08/16/24 0458 08/17/24 0622  WBC 9.2 7.1 5.9  RBC 3.90 4.63 4.45  HGB 11.4* 13.4 12.8  HCT 35.4* 41.3 40.1  MCV 90.8 89.2 90.1  MCH 29.2 28.9 28.8  MCHC 32.2 32.4 31.9  RDW 15.0 15.4 15.6*  PLT 141* 168 157    Cardiac Enzymes  Recent Labs  Lab 08/13/24 0855 08/13/24 1714 08/13/24 2150 08/14/24 2155 08/14/24 2358  TRNPT 110* 123* 126* 132* 129*    BNP    Component Value Date/Time   BNP 105.0 (H) 12/02/2023 9092  ProBNP    Component Value Date/Time   PROBNP 5,944.0 (H) 08/13/2024 0509     Lipids  Lab Results  Component Value Date   CHOL 181 10/07/2009   HDL 54 10/07/2009   LDLCALC 106 (H) 10/07/2009   TRIG 106 10/07/2009   CHOLHDL 3.4 Ratio 10/07/2009    HbA1c  Lab Results  Component Value Date   HGBA1C 5.6 08/13/2024    Radiology    No results found.   Telemetry    V paced - Personally Reviewed  Cardiac Studies   2D Echocardiogram 1.19.2026   1. Left ventricular ejection fraction, by estimation, is 25 to 30%. Left  ventricular ejection fraction by 3D volume is 24 %. The left ventricle has  severely decreased function. The left ventricle demonstrates global  hypokinesis. There is mild left  ventricular hypertrophy. Left ventricular diastolic parameters are  consistent with Grade I diastolic dysfunction (impaired relaxation). The  average left ventricular global longitudinal strain is  -6.1 %. The global  longitudinal strain is abnormal.   2. Right ventricular systolic function is normal. The right ventricular  size is normal. There is normal pulmonary artery systolic pressure.   3. The mitral valve is normal in structure. No evidence of mitral valve  regurgitation. No evidence of mitral stenosis.   4. The aortic valve is normal in structure. Aortic valve regurgitation is  moderate. Aortic valve sclerosis/calcification is present, without any  evidence of aortic stenosis.   Patient Profile     59 y.o. female with a hx of HFimpEF and NICM s/p [employer] CRT-D on 2.26.2019 @ [facility], LBBB, HTN, HIV, COPD, HLD, TIA, and tobacco use, who was admitted 1/19 with acute resp distress following recent similar admission w/ Flu A, COVID-19, and COPD.  Managed for AECOPE, suspected PNA, renal failure, and recurrent dx of cardiomyopathy - EF 25-30% w/ global HK.  Assessment & Plan    1.  Acute on chronic HFrEF/NICM:  H/o NICM w/ EF prev as low as 25%.  Nl cors on cath 08/2017.  S/p MDT CRT-D @ [facility] in 08/2017.  Nl cor CTA in 2021.  Nl EF by echo in 01/2023.  Admitted w/ respiratory failure/suspected PNA  EF 25-30% w/ glob HK, GrI DD, nl RV fxn, moderate AI, and AoV sclerosis w/o stenosis by echo this admission.  Was not on outpt diuretics and has not required diuretics here.  Hydrated in setting of acute renal failure.  +8.8 liters for admission.  Wt overall stable @ 43.2 kg.  Feels well and is euvolemic on exam.  Cont ? blocker and bidil . No acei/arb/arni/mra/sglt2i at this time in setting of renal failure, which is improving but is not yet back to baseline. Will need close outpt f/u and to establish w/ EP given 59 year old CRT-D that does not appear to be followed currently.  2.  Primary HTN:  BP soft but stable.  3.  AKI on CKD III-IV:  Creat relatively stable this AM @ 2.65.  Creat was 1.37 earlier this month and peaked @ 4.0 this admission.  Diuretics on hold.  Eventual ARB/ARNI and SGLT2i as  renal fxn permits.  4.  Tobacco abuse:  cessation advised.  Signed, Lonni Meager, NP  08/20/2024, 3:26 PM    For questions or updates, please contact   Please consult [email or web address] for contact info under Cardiology/STEMI.  "

## 2024-08-20 NOTE — Progress Notes (Signed)
 Patient is not able to walk the distance required to go the bathroom, or he/she is unable to safely negotiate stairs required to access the bathroom.  A 3in1 BSC will alleviate this problem

## 2024-08-20 NOTE — Progress Notes (Signed)
 Occupational Therapy Treatment Patient Details Name: Claire Day MRN: [id number] DOB: 1965-10-01 Today's Date: 08/20/2024   History of present illness Pt is a 59 yo female that presented to ED for acute respiratory distress. Initially placed on bipap but intubated and admitted to ICU (extubated 1/20). PMH of HIV (low CD4), COPD (not on home O2), active tobacco use, NICM with HFrEF s/p AICD, HTN, CKD III, cerebral aneurysm, prior TIA, recent Influenza A and COVID-19 infection   OT comments  Pt is supine in bed on arrival. Pleasant and agreeable to OT session. She denies pain. Pt performed bed mobility, ADL transfers including all aspects of toileting, hand hygiene and oral care standing at sink and functional mobility without AD use at supv/MOD I level. Pt ambulated ~45 ft into the hallway without AD use prior to handoff to PT. Sp02 on RA throughout session 87-95% with mild DOE and fatigue reported. HR up to 105. Pt left with all needs in place and will cont to require skilled acute OT services to maximize her safety and IND to return to PLOF. Pt feels like she will be able to return home with sister and mother available to assist and recommendation for shower chair and implementation of ECS/pacing/rest breaks to prevent overexertion. Pt verbalized understanding.       If plan is discharge home, recommend the following:  Assist for transportation;Assistance with cooking/housework;Help with stairs or ramp for entrance   Equipment Recommendations  Tub/shower seat    Recommendations for Other Services      Precautions / Restrictions Precautions Precautions: Fall Recall of Precautions/Restrictions: Intact Restrictions Weight Bearing Restrictions Per Provider Order: No       Mobility Bed Mobility Overal bed mobility: Modified Independent                  Transfers Overall transfer level: Modified independent Equipment used: None Transfers: Sit to/from Stand Sit to Stand:  Modified independent (Device/Increase time)           General transfer comment: standing and ADL transfers to toilet, functional mobility within the room and out into the hallway     Balance Overall balance assessment: Independent Sitting-balance support: Feet supported, No upper extremity supported Sitting balance-Leahy Scale: Good     Standing balance support: No upper extremity supported, During functional activity Standing balance-Leahy Scale: Good                             ADL either performed or assessed with clinical judgement   ADL       Grooming: Wash/dry hands;Wash/dry face;Modified independent;Supervision/safety;Standing;Oral care                   Toilet Transfer: Modified Independent;Supervision/safety   Toileting- Clothing Manipulation and Hygiene: Sitting/lateral lean;Modified independent Toileting - Clothing Manipulation Details (indicate cue type and reason): continent urine on toilet       General ADL Comments: ambulated within the room and additional 45 ft without AD with supervision/mod I prior to handoff to PT    Extremity/Trunk Assessment              Vision       Perception     Praxis     Communication Communication Communication: No apparent difficulties   Cognition Arousal: Alert Behavior During Therapy: [facility] for tasks assessed/performed  Following commands: Intact        Cueing   Cueing Techniques: Verbal cues  Exercises Other Exercises Other Exercises: Edu on ECS, pacing, and incorporating rest breaks to prevent overexertion.    Shoulder Instructions       General Comments      Pertinent Vitals/ Pain       Pain Assessment Pain Assessment: No/denies pain  Home Living                                          Prior Functioning/Environment              Frequency  Min 2X/week        Progress Toward Goals  OT  Goals(current goals can now be found in the care plan section)  Progress towards OT goals: Progressing toward goals  Acute Rehab OT Goals Patient Stated Goal: get stronger OT Goal Formulation: With patient Time For Goal Achievement: 08/30/24 Potential to Achieve Goals: Good  Plan      Co-evaluation                 AM-PAC OT 6 Clicks Daily Activity     Outcome Measure   Help from another person eating meals?: None Help from another person taking care of personal grooming?: None Help from another person toileting, which includes using toliet, bedpan, or urinal?: None Help from another person bathing (including washing, rinsing, drying)?: None Help from another person to put on and taking off regular upper body clothing?: None Help from another person to put on and taking off regular lower body clothing?: None 6 Click Score: 24    End of Session    OT Visit Diagnosis: Other abnormalities of gait and mobility (R26.89);Muscle weakness (generalized) (M62.81)   Activity Tolerance Patient tolerated treatment well   Patient Left  (handoff to PT)   Nurse Communication Mobility status        Time: 8983-8972 OT Time Calculation (min): 11 min  Charges: OT General Charges $OT Visit: 1 Visit OT Treatments $Self Care/Home Management : 8-22 mins  Claire Day, OTR/L  08/20/24, 12:34 PM   Claire Day 08/20/2024, 12:29 PM

## 2024-08-20 NOTE — TOC Progression Note (Signed)
 Transition of Care [facility]) - Progression Note    Patient Details  Name: Claire Day MRN: [id number] Date of Birth: [birth year]  [facility] CM/SW Contact  Corean ONEIDA Haddock, RN Phone Number: 08/20/2024, 2:28 PM  Clinical Narrative:     Therapy has updated recs to home health Spoke with patient. She confirms that Inpatient rehab admissions coordinator has  updated her about the change  Patient in agreement to [facility]. States she does not have a preference of agency.  [facility] accepted in [place] and notified Cory with [place].  Patient in agreement to [facility], [facility], Shower stool.  She would like to see if shower stool would be covered by insurance.  Referral made to [facility] with adapt  She states if she is discharged tomorrow her sister would be to transport after 230 pm                     Expected Discharge Plan and Services                                               Social Drivers of Health (SDOH) Interventions SDOH Screenings   Food Insecurity: No Food Insecurity (08/13/2024)  Housing: Unknown (08/13/2024)  Transportation Needs: No Transportation Needs (08/13/2024)  Utilities: Not At Risk (08/13/2024)  Social Connections: Patient Declined (07/26/2024)  Tobacco Use: High Risk (07/25/2024)    Readmission Risk Interventions    08/13/2024   12:05 PM 12/02/2023    9:43 PM  Readmission Risk Prevention Plan  Post Dischage Appt  Complete  Medication Screening  Complete  Transportation Screening Complete Complete  PCP or Specialist Appt within 3-5 Days Complete   HRI or Home Care Consult Complete   Social Work Consult for Recovery Care Planning/Counseling Complete   Palliative Care Screening Not Applicable   Medication Review [employer]) Complete

## 2024-08-21 ENCOUNTER — Other Ambulatory Visit: Payer: Self-pay

## 2024-08-21 LAB — BASIC METABOLIC PANEL WITH GFR
Anion gap: 7 (ref 5–15)
BUN: 32 mg/dL — ABNORMAL HIGH (ref 6–20)
CO2: 26 mmol/L (ref 22–32)
Calcium: 8.8 mg/dL — ABNORMAL LOW (ref 8.9–10.3)
Chloride: 108 mmol/L (ref 98–111)
Creatinine, Ser: 2.37 mg/dL — ABNORMAL HIGH (ref 0.44–1.00)
GFR, Estimated: 23 mL/min — ABNORMAL LOW
Glucose, Bld: 98 mg/dL (ref 70–99)
Potassium: 5.6 mmol/L — ABNORMAL HIGH (ref 3.5–5.1)
Sodium: 141 mmol/L (ref 135–145)

## 2024-08-21 LAB — GLUCOSE, CAPILLARY
Glucose-Capillary: 73 mg/dL (ref 70–99)
Glucose-Capillary: 94 mg/dL (ref 70–99)

## 2024-08-21 MED ORDER — SENNA 8.6 MG PO TABS
1.0000 | ORAL_TABLET | Freq: Every day | ORAL | 0 refills | Status: AC | PRN
  Filled 2024-08-21: qty 100, 100d supply, fill #0

## 2024-08-21 MED ORDER — ADULT MULTIVITAMIN W/MINERALS CH
1.0000 | ORAL_TABLET | Freq: Every day | ORAL | 0 refills | Status: AC
  Filled 2024-08-21: qty 30, 30d supply, fill #0

## 2024-08-21 MED ORDER — FLUCONAZOLE 100 MG PO TABS
100.0000 mg | ORAL_TABLET | Freq: Every day | ORAL | 0 refills | Status: AC
  Filled 2024-08-21: qty 9, 9d supply, fill #0

## 2024-08-21 MED ORDER — CALCIUM CARBONATE ANTACID 500 MG PO CHEW
1.0000 | CHEWABLE_TABLET | Freq: Three times a day (TID) | ORAL | 0 refills | Status: AC
  Filled 2024-08-21: qty 90, 30d supply, fill #0

## 2024-08-21 MED ORDER — CARVEDILOL 6.25 MG PO TABS
6.2500 mg | ORAL_TABLET | Freq: Two times a day (BID) | ORAL | 0 refills | Status: AC
  Filled 2024-08-21: qty 60, 30d supply, fill #0

## 2024-08-21 MED ORDER — ISOSORB DINITRATE-HYDRALAZINE 20-37.5 MG PO TABS
1.0000 | ORAL_TABLET | Freq: Three times a day (TID) | ORAL | 0 refills | Status: AC
  Filled 2024-08-21: qty 90, 30d supply, fill #0

## 2024-08-21 MED ORDER — B COMPLEX-C PO TABS
1.0000 | ORAL_TABLET | Freq: Every day | ORAL | 0 refills | Status: AC
  Filled 2024-08-21: qty 30, 30d supply, fill #0

## 2024-08-21 MED ORDER — MENTHOL 3 MG MT LOZG
1.0000 | LOZENGE | OROMUCOSAL | 12 refills | Status: AC | PRN
  Filled 2024-08-21: qty 100, fill #0

## 2024-08-21 MED ORDER — ENSURE PLUS HIGH PROTEIN PO LIQD
237.0000 mL | Freq: Two times a day (BID) | ORAL | 0 refills | Status: AC
  Filled 2024-08-21: qty 14220, 30d supply, fill #0

## 2024-08-21 NOTE — Progress Notes (Signed)
 Brief EP note  CRT-D Device interrogation performed.  Claria MRI Quad CRTD [id number] Serial number [id number]  Battery 5 months Lead measurements stable, LV threshold elevated appears stable VP 97.5% (effective 96.5) Low AP  Optivol elevated HR histograms elevated   Will work with MDT rep to enroll in remote monitoring and schedule outpatient follow-up.     Claire Kirksey, NP Electrophysiology 08/21/24 9:41 AM

## 2024-08-21 NOTE — Progress Notes (Signed)
 Occupational Therapy Treatment Patient Details Name: Claire Day MRN: [id number] DOB: [birth year] Today's Date: 08/21/2024   History of present illness Pt is a 59 yo female that presented to ED for acute respiratory distress. Initially placed on bipap but intubated and admitted to ICU (extubated 1/20). PMH of HIV (low CD4), COPD (not on home O2), active tobacco use, NICM with HFrEF s/p AICD, HTN, CKD III, cerebral aneurysm, prior TIA, recent Influenza A and COVID-19 infection   OT comments  Pt is seated EOB on arrival. Pleasant and agreeable to OT session. She denies pain. Pt anticipates discharge home this date. Provided written handout with education on ECS to implement during daily routine, educated on use of DME and AE/AD as well as incorporating rest breaks and modifying her environment to meet her needs. Pt verbalized understanding of all education.  Pt returned to bed with all needs in place and will cont to require skilled acute OT services to maximize her safety and IND to return to PLOF.       If plan is discharge home, recommend the following:  Assist for transportation;Assistance with cooking/housework;Help with stairs or ramp for entrance   Equipment Recommendations  Tub/shower seat    Recommendations for Other Services      Precautions / Restrictions Precautions Precautions: Fall Recall of Precautions/Restrictions: Intact Restrictions Weight Bearing Restrictions Per Provider Order: No       Mobility Bed Mobility               General bed mobility comments: seated EOB on entry    Transfers Overall transfer level: Modified independent Equipment used: None Transfers: Sit to/from Stand                   Balance Overall balance assessment: Independent Sitting-balance support: Feet supported, No upper extremity supported Sitting balance-Leahy Scale: Normal                                     ADL either performed or assessed with  clinical judgement   ADL Overall ADL's : Needs assistance/impaired                                       General ADL Comments: provided written ECS handout and edu on task modification and DME for increased ease and safety with ADLs    Extremity/Trunk Assessment              Vision       Perception     Praxis     Communication Communication Communication: No apparent difficulties   Cognition Arousal: Alert Behavior During Therapy: WFL for tasks assessed/performed                                 Following commands: Intact        Cueing   Cueing Techniques: Verbal cues  Exercises Other Exercises Other Exercises: Edu on ECS, pacing, and incorporating rest breaks to prevent overexertion. Written handout provided to maximize carryover. Pt verbalized understanding.    Shoulder Instructions       General Comments      Pertinent Vitals/ Pain       Pain Assessment Pain Assessment: No/denies pain  Home Living  Prior Functioning/Environment              Frequency  Min 2X/week        Progress Toward Goals  OT Goals(current goals can now be found in the care plan section)  Progress towards OT goals: Progressing toward goals  Acute Rehab OT Goals Patient Stated Goal: get better OT Goal Formulation: With patient Time For Goal Achievement: 08/30/24 Potential to Achieve Goals: Good  Plan      Co-evaluation                 AM-PAC OT 6 Clicks Daily Activity     Outcome Measure   Help from another person eating meals?: None Help from another person taking care of personal grooming?: None Help from another person toileting, which includes using toliet, bedpan, or urinal?: None Help from another person bathing (including washing, rinsing, drying)?: None Help from another person to put on and taking off regular upper body clothing?: None Help from another  person to put on and taking off regular lower body clothing?: None 6 Click Score: 24    End of Session    OT Visit Diagnosis: Other abnormalities of gait and mobility (R26.89);Muscle weakness (generalized) (M62.81)   Activity Tolerance Patient tolerated treatment well   Patient Left in bed   Nurse Communication Mobility status        Time: 1030-1040 OT Time Calculation (min): 10 min  Charges: OT General Charges $OT Visit: 1 Visit OT Treatments $Self Care/Home Management : 8-22 mins  Elizar Alpern Chrismon, OTR/L  08/21/24, 1:11 PM   Matej Sappenfield E Chrismon 08/21/2024, 1:07 PM

## 2024-08-21 NOTE — Plan of Care (Signed)
   Problem: Coping: Goal: Ability to adjust to condition or change in health will improve Outcome: Progressing   Problem: Fluid Volume: Goal: Ability to maintain a balanced intake and output will improve Outcome: Progressing   Problem: Skin Integrity: Goal: Risk for impaired skin integrity will decrease Outcome: Progressing

## 2024-08-21 NOTE — Progress Notes (Signed)
 Mobility Specialist - Progress Note  Pre-mobility: HR 100, BP, SpO2 99%  Post-mobility: HR 105, BP, SPO2 91%   08/21/24 1000  Mobility  Activity Ambulated with assistance  Level of Assistance Standby assist, set-up cues, supervision of patient - no hands on  Assistive Device None  Distance Ambulated (ft) 160 ft  Activity Response Tolerated well  Mobility visit 1 Mobility  Mobility Specialist Start Time (ACUTE ONLY) 0934  Mobility Specialist Stop Time (ACUTE ONLY) 0945  Mobility Specialist Time Calculation (min) (ACUTE ONLY) 11 min   Pt semi fowler upon entry, utilizing RA--- O2 >90%. She completed bed mob indep, amb one lap around the NS with sup-- endorsed some shortness of breath during amb, O2  remains WNL. Pt returned to the room, left seated EOB with needs within reach.  Claire Day Mobility Specialist 08/21/24 10:13 AM

## 2024-08-21 NOTE — TOC Transition Note (Signed)
 Transition of Care [facility]) - Discharge Note   Patient Details  Name: Claire Day MRN: [id number] Date of Birth: 21-Mar-1966  [facility] CM/SW Contact:  Corean ONEIDA Haddock, RN Phone Number: 08/21/2024, 10:54 AM   Clinical Narrative:        Patient to dc today RW and BSC were delivered to bedside. Shower stool was going to be private pay, and patient declined.  Patient aware that [facility] can be used as a shower seat if needed.  Darleene with [facility] notified of discharge     Patient Goals and CMS Choice            Discharge Placement                       Discharge Plan and Services Additional resources added to the After Visit Summary for                                       Social Drivers of Health (SDOH) Interventions SDOH Screenings   Food Insecurity: No Food Insecurity (08/13/2024)  Housing: Unknown (08/13/2024)  Transportation Needs: No Transportation Needs (08/13/2024)  Utilities: Not At Risk (08/13/2024)  Social Connections: Patient Declined (07/26/2024)  Tobacco Use: High Risk (07/25/2024)     Readmission Risk Interventions    08/13/2024   12:05 PM 12/02/2023    9:43 PM  Readmission Risk Prevention Plan  Post Dischage Appt  Complete  Medication Screening  Complete  Transportation Screening Complete Complete  PCP or Specialist Appt within 3-5 Days Complete   HRI or Home Care Consult Complete   Social Work Consult for Recovery Care Planning/Counseling Complete   Palliative Care Screening Not Applicable   Medication Review Oceanographer) Complete

## 2024-11-20 ENCOUNTER — Ambulatory Visit: Admitting: Cardiology

## 2024-11-23 ENCOUNTER — Ambulatory Visit

## 2024-12-25 ENCOUNTER — Ambulatory Visit: Admitting: Urology

## 2025-02-15 ENCOUNTER — Ambulatory Visit: Admitting: Urology

## 2025-02-22 ENCOUNTER — Ambulatory Visit

## 2025-05-24 ENCOUNTER — Ambulatory Visit

## 2025-08-23 ENCOUNTER — Ambulatory Visit

## 2025-11-22 ENCOUNTER — Ambulatory Visit
